# Patient Record
Sex: Male | Born: 1960
Health system: Southern US, Community
[De-identification: ages and names within clinical notes are randomized; demographics above are authoritative.]

## PROBLEM LIST (undated history)

## (undated) DIAGNOSIS — R7303 Prediabetes: Secondary | ICD-10-CM

## (undated) DIAGNOSIS — F72 Severe intellectual disabilities: Secondary | ICD-10-CM

## (undated) DIAGNOSIS — F840 Autistic disorder: Secondary | ICD-10-CM

## (undated) DIAGNOSIS — I10 Essential (primary) hypertension: Secondary | ICD-10-CM

## (undated) DIAGNOSIS — K219 Gastro-esophageal reflux disease without esophagitis: Secondary | ICD-10-CM

## (undated) DIAGNOSIS — F8189 Other developmental disorders of scholastic skills: Secondary | ICD-10-CM

## (undated) DIAGNOSIS — E782 Mixed hyperlipidemia: Secondary | ICD-10-CM

## (undated) DIAGNOSIS — G40909 Epilepsy, unspecified, not intractable, without status epilepticus: Secondary | ICD-10-CM

## (undated) HISTORY — DX: Mixed hyperlipidemia: E78.2

## (undated) HISTORY — DX: Essential (primary) hypertension: I10

## (undated) HISTORY — DX: Gastro-esophageal reflux disease without esophagitis: K21.9

## (undated) HISTORY — DX: Severe intellectual disabilities: F72

## (undated) HISTORY — DX: Prediabetes: R73.03

## (undated) HISTORY — DX: Other developmental disorders of scholastic skills: F81.89

## (undated) HISTORY — DX: Autistic disorder: F84.0

## (undated) HISTORY — DX: Epilepsy, unspecified, not intractable, without status epilepticus: G40.909

---

## 2000-09-22 ENCOUNTER — Emergency Department (HOSPITAL_COMMUNITY): Admission: EM | Admit: 2000-09-22 | Discharge: 2000-09-22 | Payer: Self-pay | Admitting: Emergency Medicine

## 2008-12-01 ENCOUNTER — Inpatient Hospital Stay (HOSPITAL_COMMUNITY): Admission: EM | Admit: 2008-12-01 | Discharge: 2008-12-06 | Payer: Self-pay | Admitting: Emergency Medicine

## 2008-12-01 ENCOUNTER — Ambulatory Visit (HOSPITAL_COMMUNITY): Admission: RE | Admit: 2008-12-01 | Discharge: 2008-12-01 | Payer: Self-pay | Admitting: Family Medicine

## 2009-06-19 ENCOUNTER — Ambulatory Visit (HOSPITAL_COMMUNITY): Admission: RE | Admit: 2009-06-19 | Discharge: 2009-06-19 | Payer: Self-pay | Admitting: Family Medicine

## 2010-11-05 LAB — DIFFERENTIAL
Basophils Relative: 0 % (ref 0–1)
Eosinophils Absolute: 0.1 10*3/uL (ref 0.0–0.7)
Lymphs Abs: 1.1 10*3/uL (ref 0.7–4.0)
Neutrophils Relative %: 69 % (ref 43–77)

## 2010-11-05 LAB — COMPREHENSIVE METABOLIC PANEL
ALT: 25 U/L (ref 0–53)
Albumin: 3.5 g/dL (ref 3.5–5.2)
Alkaline Phosphatase: 53 U/L (ref 39–117)
Potassium: 3.8 mEq/L (ref 3.5–5.1)
Sodium: 138 mEq/L (ref 135–145)
Total Protein: 7 g/dL (ref 6.0–8.3)

## 2010-11-05 LAB — CBC
HCT: 39.4 % (ref 39.0–52.0)
MCHC: 33.6 g/dL (ref 30.0–36.0)
MCHC: 34.3 g/dL (ref 30.0–36.0)
MCV: 89.6 fL (ref 78.0–100.0)
MCV: 90.3 fL (ref 78.0–100.0)
Platelets: 245 10*3/uL (ref 150–400)
RBC: 4.39 MIL/uL (ref 4.22–5.81)
WBC: 7.2 10*3/uL (ref 4.0–10.5)

## 2010-11-05 LAB — PROTIME-INR
INR: 1.1 (ref 0.00–1.49)
Prothrombin Time: 14.6 seconds (ref 11.6–15.2)

## 2010-11-05 LAB — BASIC METABOLIC PANEL
BUN: 8 mg/dL (ref 6–23)
BUN: 9 mg/dL (ref 6–23)
CO2: 22 mEq/L (ref 19–32)
Chloride: 105 mEq/L (ref 96–112)
Chloride: 108 mEq/L (ref 96–112)
Creatinine, Ser: 0.68 mg/dL (ref 0.4–1.5)
Creatinine, Ser: 0.79 mg/dL (ref 0.4–1.5)
GFR calc Af Amer: >60 mL/min (ref >60)
Potassium: 4.1 mEq/L (ref 3.5–5.1)

## 2010-11-05 LAB — ABO/RH: ABO/RH(D): A POS

## 2010-11-05 LAB — URINALYSIS, ROUTINE W REFLEX MICROSCOPIC
Glucose, UA: NEGATIVE mg/dL
Ketones, ur: NEGATIVE mg/dL
Nitrite: NEGATIVE
Protein, ur: NEGATIVE mg/dL
Urobilinogen, UA: 0.2 mg/dL (ref 0.0–1.0)

## 2010-11-05 LAB — TYPE AND SCREEN

## 2010-11-05 LAB — TSH: TSH: 1.823 u[IU]/mL (ref 0.350–4.500)

## 2010-11-05 LAB — GLUCOSE, CAPILLARY: Glucose-Capillary: 86 mg/dL (ref 70–99)

## 2010-12-10 NOTE — Discharge Summary (Signed)
[name], [name]             ACCOUNT NO.:  [id number]   MEDICAL RECORD NO.:  [id number]          PATIENT TYPE:  INP   LOCATION:  5013                         FACILITY:  [facility]   PHYSICIAN:  Michelene Gardener, MD    DATE OF BIRTH:  12-Oct-1960   DATE OF ADMISSION:  12/01/2008  DATE OF DISCHARGE:  12/06/2008                               DISCHARGE SUMMARY   DISCHARGE DIAGNOSES:  1. Right lower lobe pneumonia.  2. Right colonic pneumatosis.  3. Mental retardation.  4. Severe autism.  5. Gastroesophageal reflux disease.   DISCHARGE MEDICATIONS:  1. The patient will go home on the same preadmission medications that      include omeprazole 20 mg once a day.  2. Fexofenadine 180 mg once a day.  3. Oxybutynin 5 mg 3 times daily.  4. Seroquel 100 mg 3 times daily and 200 mg at bedtime.  5. Clonazepam 0.5 mg 3 times daily and 1 mg at bedtime.  6. Ensure one can twice daily.  7. Lactulose 30 mL twice daily as needed.  8. Tylenol 1000 mg as needed.  9. Carbamide peroxide eardrops 5-10 drops twice daily.   CONSULTATIONS:  Surgical consult by Sandria Bales. Ezzard Standing, M.D. initiated on  May 7.   PROCEDURES:  None.   DIAGNOSTIC STUDIES:  1. CT scan of the abdomen on May 7 showed intraperitoneal air with      pneumatosis of the right colon.  2. CT scan of pelvis with contrast on May 7 showed no significant      findings with tiny amount of free fluid.  3. Chest x-ray on May 8 showed opacity of the left lung base      consistent with air space disease.  4. Abdominal x-ray on May 8 showed upright abdominal x-ray series      showed presence of a small amount of free air under the      hemidiaphragm.   HOSPITAL COURSE:  This is a 50 year old mentally retarded patient who  was sent from a group home for evaluation of fever.  Initial evaluation  showed pneumatosis.  The patient was admitted to the hospital for  further evaluation.  His initial x-ray included x-ray that showed  possible pneumonia.   He also had CT scan of the abdomen and pelvis and  detailed results were mentioned above.  Because of the possible  pneumatosis in his peritoneum a surgical consult was obtained.  Initially he was kept n.p.o.  He was started on IV fluids.  He received  Protonix IV and he was given symptomatic treatment for pain and nausea.  Surgery saw him and decided to treat him conservatively unless his  condition deteriorated.  The patient responded very well to the medical  treatment.  His diet was started and was advanced.  By the time of  discharge the patient was back to his baseline and does not have any GI  symptoms.  He tolerated diet well.  He was deemed by surgery for  discharge.  He already received 6 days of IV  antibiotics and at this time there is no  need for further antibiotics  since he has been totally asymptomatic with no fever and normal white  counts and enough duration for IV antibiotics.  I will recommend  discharging him back on all preadmission medications.  Total assessment  time is 1 hour.      Michelene Gardener, MD  Electronically Signed     NAE/MEDQ  D:  12/06/2008  T:  12/06/2008  Job:  [id number]

## 2010-12-10 NOTE — Consult Note (Signed)
NAMEKAIEA, ESSELMAN NO.:  [id number]   MEDICAL RECORD NO.:  [id number]          PATIENT TYPE:  INP   LOCATION:  2101                         FACILITY:  [facility]   PHYSICIAN:  Sandria Bales. Ezzard Standing, M.D.  DATE OF BIRTH:  [birth year]   DATE OF CONSULTATION:  DATE OF DISCHARGE:                                 CONSULTATION   REASON FOR CONSULTATION:  Pneumoperitoneum.   REFERRING PHYSICIAN:  Samuel Jester, DO   HISTORY OF ILLNESS:  This is an unfortunate 50 year old white male who  sees Dr. Blane Ohara in [place].  He lives in a group home called [facility].  This is apparently a part of a large group of group homes  called [facility].  He has had severe life long mental retardation and  autism.  It sounds like people have to sit with him 24 hours a day.   He has at least 3 people from the home are in the emergency room with  him, Wyvonne Lenz, Corinne Ports and Philbert Riser.  I have no data from  the group home in [place] nor have I received any phone calls from Dr.  Sedalia Muta and the patient's history is obtained mainly by these 3 handlers of  Mr. Opdahl.   He apparently has repeat pneumonias that he requires treatment every 2  months.  He was actually hospitalized in [place] in December for some  period of time.   Over the last 7-10 days, he has had fevers of undetermined etiology.  He  has been on antibiotics apparently for 7-10 days, but I have no record  for those antibiotics.  It is somewhat unclear to me how he was in  [place] that he got sent to [facility].  The handler says that we  have an open CT scan and this is why he was sent here.  Apparently, he  had to be heavily sedated to get into the CAT scan today and on CAT  scan, he has been found to have small amounts of free air, sort of over  his liver, around his duodenum; it is kind of a very nonspecific pattern  and what looks like pneumatosis of his right colon and we were consulted  for evaluation  of this.   According to his handlers, he does have to take some omeprazole for  reflux, but has no known ulcer disease, liver disease, and no prior  abdominal surgery, or colon disease.  He does tend to have constipation  and they have him on lactulose to help with regular bowel movements.   PAST MEDICAL HISTORY:  He has no allergies.   CURRENT MEDICATIONS:  1. Omeprazole, dose unknown.  2. Allegra.  3. He takes Ditropan for incontinence.  4. He takes Seroquel for anxiety.  5. He takes clonazepam for anxiety.  6. He takes lactulose for help with his constipation.  7. He is on carbamide peroxide for ear irrigation.   REVIEW OF SYSTEMS:  NEUROLOGIC: he is severely retarded.  He sits in a  rocking position and this is according to his handlers and he kind of  moans and this is what he does 24 hours a day.  They say he will go days  without sleep and then he will sleep.  PULMONARY:  I have been mentioned before, he has repeat pneumonias, but  has not had any recent pneumonia that I can tell.  CARDIAC:  He has no heart issues, chest pain, or hypertension.  GASTROINTESTINAL:  See history of present illness.  UROLOGIC:  He has urinary incontinence and this is why he is on the  Ditropan.   He has a mother, Bedelia Person, who is the power of attorney, but Ms.  Loleta Chance is our of town and apparently he is still a full code.  This was  somewhat confusing to me as to why he does not have a limited or DNR  resuscitate code status.   PHYSICAL EXAMINATION:  VITAL SIGNS:  His temperature is 97, blood  pressure is 121/83, pulse is 110, and he is 98% sat.  GENERAL:  He is a pale, thin, white male.  He is in sitting position,  rocking back and forth with low moan.  NECK:  He moves his neck and is supple without pain.  LUNGS:  Symmetric breath sounds.  Again, he has a kind of loud moaning  sound he makes.  HEART:  Tachycardic, but I hear no clear murmur or rub.  ABDOMEN:  Actually is soft.  He is  sitting up, rocking.  He does not  have any peroneal signs.  He has bowels sounds, I can actually hear  them although it was somewhat decreased.  I can not get any guarding or  rebound in any of his quadrants.   LABORATORY DATA:  His white blood count is 7200, neutrophils 69%, his  hemoglobin is 14, hematocrit is 42.  His sodium is 138, potassium 3.8,  chloride of 105, CO2 of 27, BUN of 9, creatinine of 0.08.  Lactic acid  is 1.0.   IMPRESSION:  1. Right colon pneumatosis, etiology is somewhat unclear.  2. Pneumoperitoneum which really has a benign exam with normal white      blood count.  I do no think he has a surgical emergency.  I think      at least initially he is best treated with being kept n.p.o.,      placed on IV fluids for hydration, placed on IV antibiotics.      Following his CBC, and his physical exam is best can be done.  He      may even need a repeat CAT scan in 2-3 days.  I have asked the ER      to contact the hospitalist for admission and we will follow from a      surgical standpoint.  3. History of repeated pneumonias, though I do not see anything now at      least clinically that he has a pneumonia.  4. He has severe mental retardation.  5. He is autistic.  6. He is going to be a management problem in the hospital and that his      handlers are not anxious to stay up here and stay with him and they      will call sitters with him, he will need sitter 24 hours a day.      Sandria Bales. Ezzard Standing, M.D.  Electronically Signed     DHN/MEDQ  D:  12/01/2008  T:  12/02/2008  Job:  [id number]   cc:   Blane Ohara, M.D.  Manus Gunning, MD

## 2010-12-10 NOTE — H&P (Signed)
[name], [name] NO.:  [id number]   MEDICAL RECORD NO.:  [id number]          PATIENT TYPE:  INP   LOCATION:  1844                         FACILITY:  [facility]   PHYSICIAN:  Gerald Gunning, MD      DATE OF BIRTH:  1960-10-01   DATE OF ADMISSION:  12/01/2008  DATE OF DISCHARGE:                              HISTORY & PHYSICAL   CHIEF COMPLAINT:  Fevers at the patient's skilled nursing facility  measured at 102 degrees Fahrenheit.   HISTORY OF PRESENT ILLNESS:  [name] is a 50 year old Caucasian  male with severe autism and mental retardation.  All history has been  obtained from ED sign out, discussions with the surgical staff, as well  as discussion with the patient's aide who accompanied to him to the  emergency department.  Apparently [name] has been suffering with  fevers for the past week and was seen by his primary care physician, Dr.  Morton Webster and a chest x-ray was performed on Tuesday/Wednesday of this past  week.  Apparently the results were obtained earlier today at which time  she appreciated air under the right hemidiaphragm and instructed the  skilled nursing facility to bring him to the emergency department.  In  the emergency department, a CT scan of the abdomen has demonstrated the  patient to have a right colonic pneumatosis and surgery has evaluated  the patient, [name]. [name] and they have recommended the patient  to be kept on IV antibiotics, NPO, and on IV fluids and will closely  monitor him and decided whether he needs additional therapeutic  intervention, particularly exploratory laparotomy.  This decision is  based on the fact that the patient does not have an acute abdomen.  At  the time of presentation to the emergency department, he was afebrile  with 97 degrees Fahrenheit documented.  His H and H is stable at 14 and  42 respectively and his abdomen does not demonstrate any tenderness and  he has no leukocytosis  (WBC count 7200).   Apparently the patient has a longstanding history of constipation and  has had three bowel movements this past week according to the aide that  accompanies him to the hospital.  He also has frequent  pneumonia/bronchitis, approximately one to two episodes every two months  and he is incontinent of urine.  The patient's health care power of  attorney is [name] who is the patient's mother.  At this time,  the patient is hemodynamically stable.  He is rocking back and forth in  his bed which is aide claims is routine for the patient.  He does not  appear in any distress and examination though difficult has not yielded  any discomfort during palpation of the abdomen.  He does have positive  bowel sounds.   PAST MEDICAL/SURGICAL HISTORY:  1. Mental retardation.  2. Severely autistic.   SOCIAL HISTORY:  He lives at an assisted living facility.  No tobacco or  illicit history in the past.   FAMILY HISTORY:  Unavailable.  Aide does not know.   ALLERGIES:  The patient has no known drug allergies.   HOME MEDICATIONS:  1. Omeprazole.  2. Fexofenadine.  3. Oxybutynin chloride.  4. Seroquel.  5. Clonazepam.  6. Lactulose.  7. Carbamide peroxide.   Dose and frequency of these medications are unknown.   REVIEW OF SYSTEMS:  Review of systems has been attempted.  The pertinent  positives have been described above.  According to the aide, there is no  history of syncope, presycope, falls or trauma.  The patient has been  tolerating p.o. intake.  There is no history of odynophagia or  dysphagia.  No complaints of chest pain, palpitations, PND or orthopnea.  No shortness of breath, cough or expectoration.  There is no complaint  of abdominal pain and no diarrhea.  History of constipation.  No change  in his urinary habits.  He is essentially incontinent and there is no  history of musculoskeletal complaints.  Again, the patient himself is  not able to express  himself and all review of systems is essentially  subjective based on the aide's understanding of the patient's needs on a  daily basis.   PHYSICAL EXAMINATION:  VITALS AT TIME OF PRESENTATION:  Temperature 97,  heart rate 110, O2 sat 98%, respiratory rate 20, blood pressure 121/83.  GENERAL:  An elderly gentleman rocking back and forth in bed, in no  apparent distress.  HEENT:  Normocephalic, atraumatic.  Moist oral mucosa.  No thrush,  erythema or postnasal drip.  Eyes anicteric.  Extraocular muscles are  intact.  Pupils equal and react to light and accommodation.  CARDIOVASCULAR:  S1, S2 normal, regular rate and rhythm, no murmurs,  rubs or gallops.  RS:  Air entry is bilaterally equal.  No rales, rhonchi or wheezes  appreciated.  ABDOMEN:  Soft, nontender and does not appear to be distended.  Positive  bowel sounds.  No organomegaly.  There is no Collin's sign, no Turner's  sign, and no flank discoloration.  EXTREMITIES:  No cyanosis, clubbing or edema.  The patient is sitting up  but will not extend extremities completely.  CNS:  The patient has severe mental retardation and autistic.  He moves  all four extremities independently though.  There appeared to be no  focal neurological deficits on limited examination.  SKIN:  No break down, swelling, ulcerations or masses appreciated.  HEME/ONC:  No palpable lymphadenopathy, bruising or petechiae.   LABORATORY DATA:  Sodium 138, potassium 3.8, chloride 105, carbon  dioxide 27, glucose 100, BUN 9, creatinine 0.68, calcium 9.3, APTT 35,  INR 1.1, WBCs 7200, hemoglobin 14.1, hematocrit 42, platelet count 245,  polymorphs 69, lactic acid 1.0.   ASSESSMENT/PLAN:  1. Right colonic pneumatosis.  I have discussed the case with Dr.      Lindie Webster.  He has recommended observation at this time as there are no      signs of an acute abdomen.  We will admit him to the ICU and obtain      a one on one sitter with him.  He will be kept NPO, start D5  normal      saline at 125 cc an hour.  I will also Protonix 40 mg IV q.12 h.      and provide pain and anxiety control, morphine 2 mg IV q.4 h.      p.r.n. and Ativan 1 mg IV q.8 h. p.r.n.  For antibiotic control, we      will start Zosyn 3.375 grams IV q.6 h.  Will obtain an abdominal      KUB and chest x-ray in the morning.  If the patient's condition      does worsen overnight or he becomes unstable, emergent surgery may      be required.  Dr. Lindie Webster and Dr. Ezzard Standing are aware and will follow      the patient with [place].  2. GI and DVT prophylaxis.  Protonix 40 mg IV q.12 and sequential      compression devices.  3. History of mental retardation and severe autism.  Provide      supportive care at this time.  All p.o. meds have been held and      will be resumed once okayed by surgery.      Gerald Gunning, MD  Electronically Signed     SP/MEDQ  D:  12/01/2008  T:  12/01/2008  Job:  [id number]

## 2011-01-28 IMAGING — CT CT PELVIS W/ CM
2 of 5 series · 17 of 46 positions shown, 19 images · IV contrast (agent unspecified)
Comparison: 12/01/2008

CT ABDOMEN

CLINICAL DATA: Reevaluate free abdominal air.

CT ABDOMEN AND PELVIS WITH CONTRAST
TECHNIQUE: Multidetector CT imaging of the abdomen and pelvis was
performed using the standard protocol following bolus
administration of intravenous contrast.
Contrast: 100 ml of Lmnipaque-6EE

[Series 2: routine abdomen · axial · 0.70mm/px · z∈[-436,-16]mm · 14 of 94 slices shown, 16 images]
[im 5/94  soft-tissue]
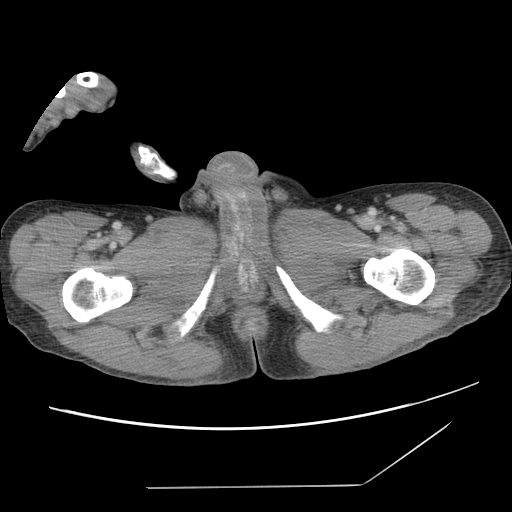
[im 5/94  bone]
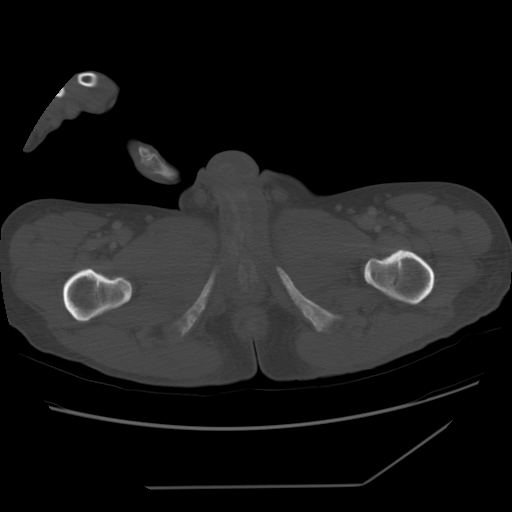
[im 10/94  soft-tissue]
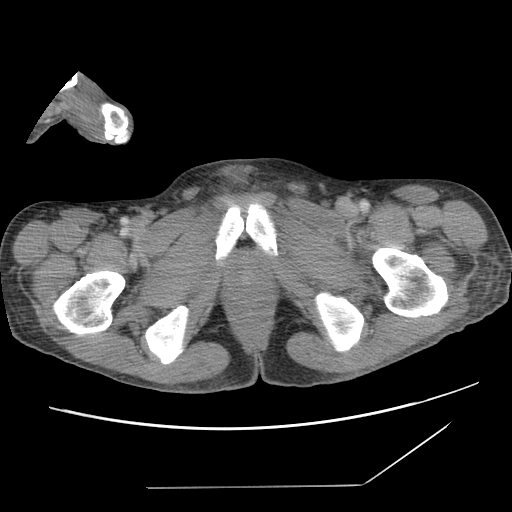
[im 20/94  soft-tissue]
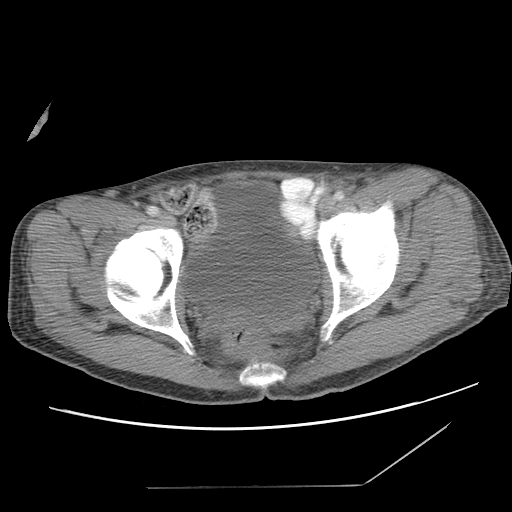
[im 25/94  soft-tissue]
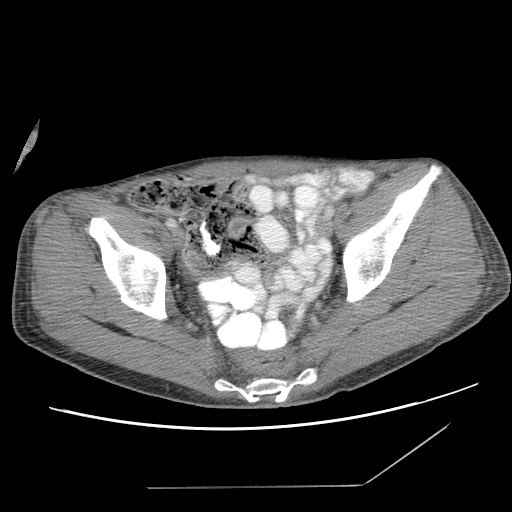
[im 30/94  soft-tissue]
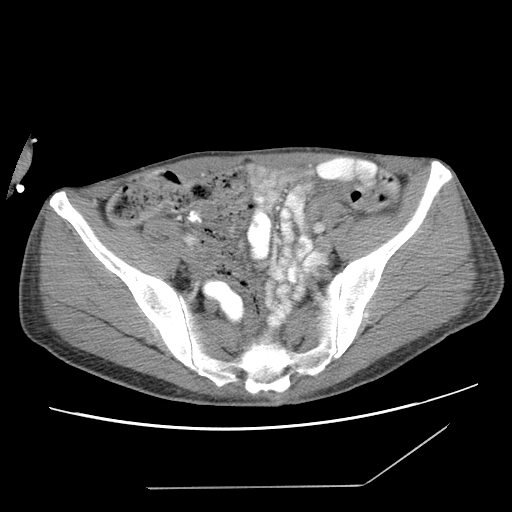
[im 40/94  soft-tissue]
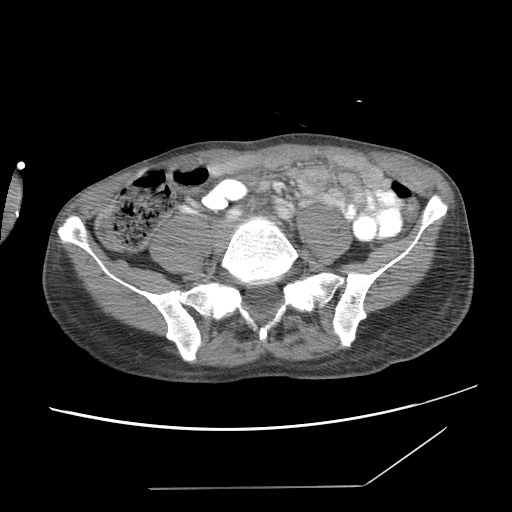
[im 45/94  soft-tissue]
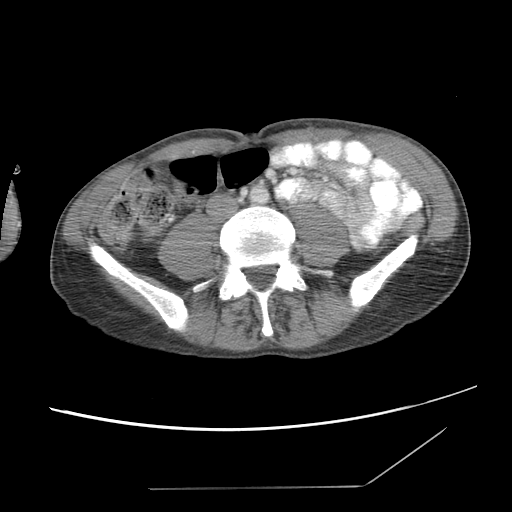
[im 49/94  soft-tissue]
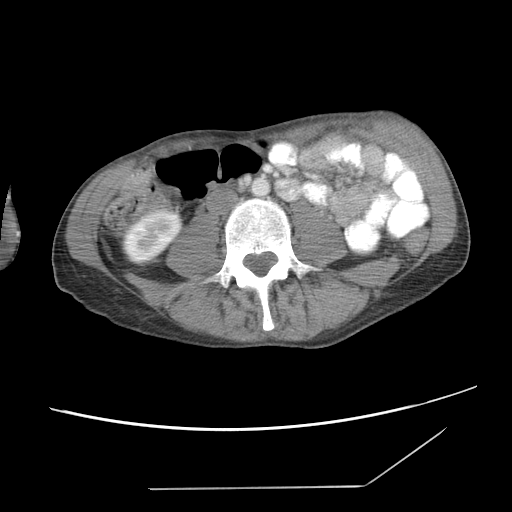
[im 54/94  soft-tissue]
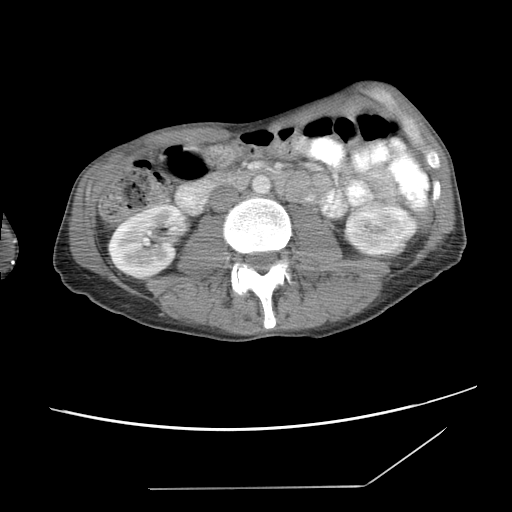
[im 54/94  bone]
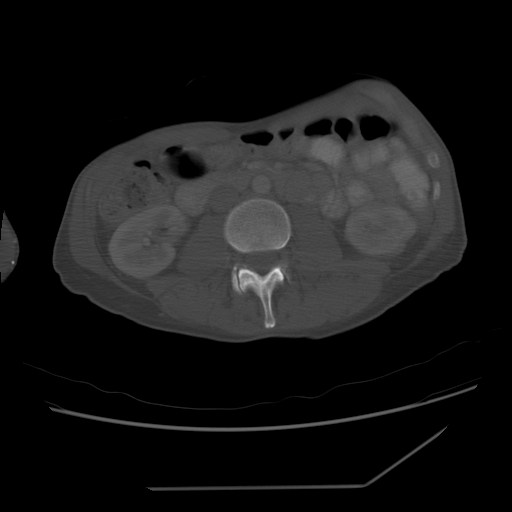
[im 64/94  soft-tissue]
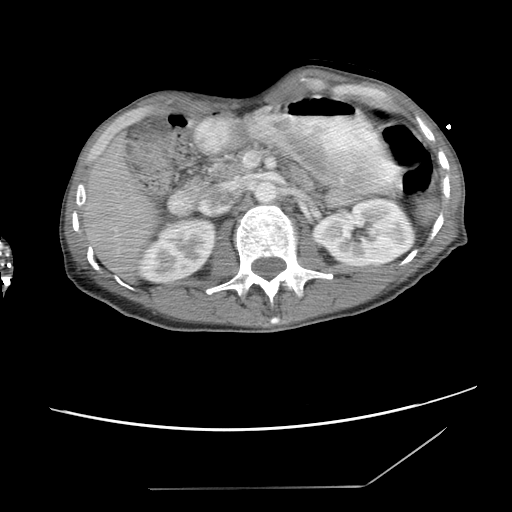
[im 69/94  soft-tissue]
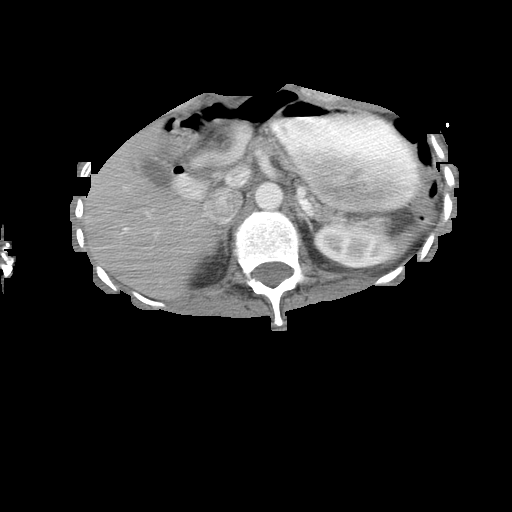
[im 74/94  soft-tissue]
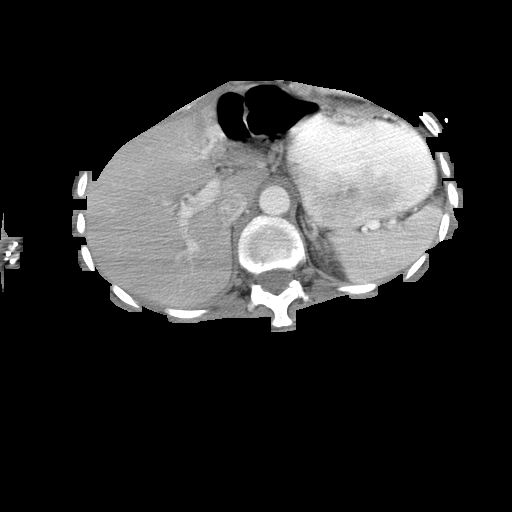
[im 84/94  soft-tissue]
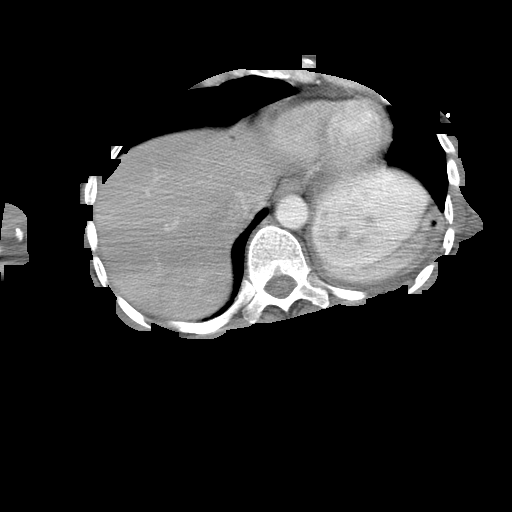
[im 89/94  soft-tissue]
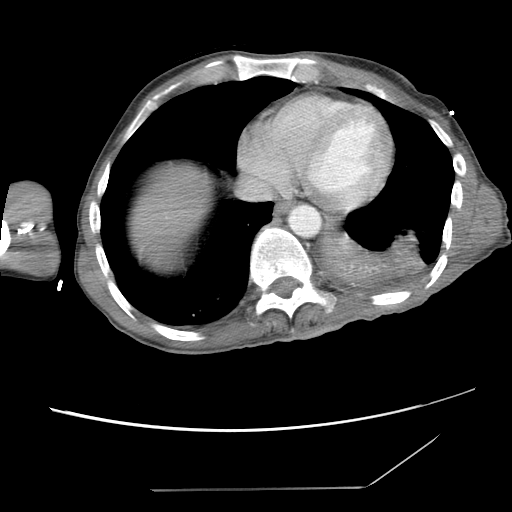

[Series 401: cor · coronal · 0.90mm/px · 3 of 75 slices shown]
[im 25/75  soft-tissue]
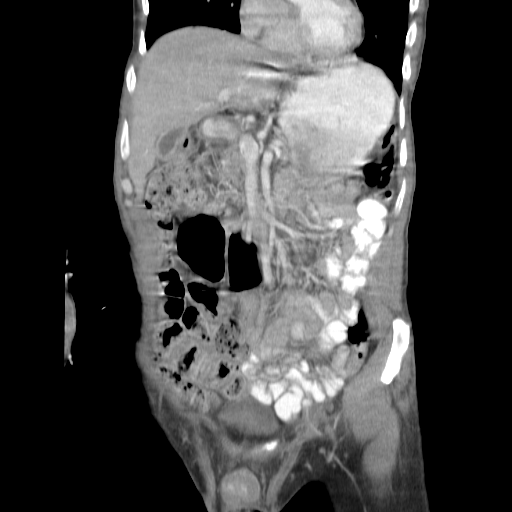
[im 33/75  soft-tissue]
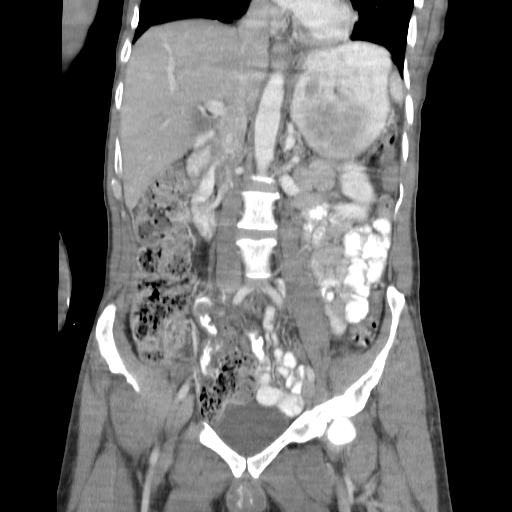
[im 42/75  soft-tissue]
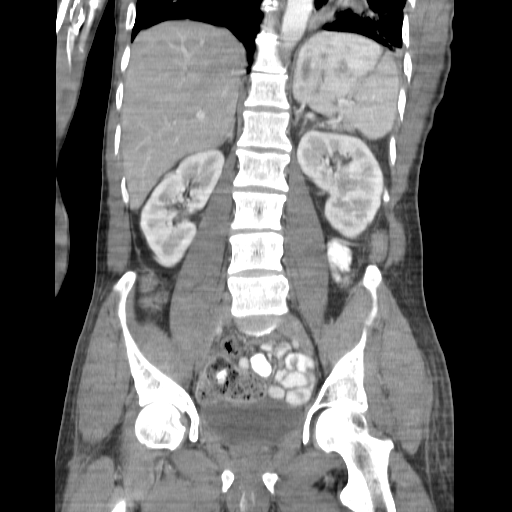

[17 of 46 positions shown; findings below may reference images not displayed]

FINDINGS: Persistent left lower lobe pneumonia and small
parapneumonic effusion.

Interval near complete resolution of the free intraperitoneal air
seen on the prior examination.  There are only the one or two dots
of residual air just anterior to the antral region of the stomach.
The liver and spleen are stable.  Pancreas is unremarkable.  The
gallbladder demonstrates mild wall thickening.  The stomach,
duodenum, small bowel and colon grossly normal.  I do not see any
pneumatosis involving the right colon was present previously.  Mild
thickening in the antropyloric region of the stomach may be due to
contraction.

The adrenal glands and kidneys are unremarkable and stable.  The
aorta is normal in caliber.  No mesenteric or retroperitoneal
masses or adenopathy.
IMPRESSION: 1.  Near complete resolution of the free air seen in the abdomen on
the previous CT scan.
2.  Resolution of the pneumatosis involving the right colon.
3.  Stable CT appearance of the solid abdominal organs.
4.  Mild gallbladder wall thickening.

CT PELVIS
FINDINGS: The rectum demonstrates mild diffuse wall thickening.
As a small amount of perirectal fluid.  The bladder appears normal.
The sigmoid colon and small bowel are grossly normal.  No pelvic
mass or adenopathy.  No inguinal adenopathy.  Mild prostate gland
enlargement.
IMPRESSION: 1.  Mild apparent rectal wall thickening.
2.  Small amount of free pelvic fluid.

## 2011-07-31 DIAGNOSIS — I10 Essential (primary) hypertension: Secondary | ICD-10-CM | POA: Diagnosis not present

## 2011-08-18 DIAGNOSIS — E7800 Pure hypercholesterolemia, unspecified: Secondary | ICD-10-CM | POA: Diagnosis not present

## 2011-08-18 DIAGNOSIS — F72 Severe intellectual disabilities: Secondary | ICD-10-CM | POA: Diagnosis not present

## 2011-08-18 DIAGNOSIS — I10 Essential (primary) hypertension: Secondary | ICD-10-CM | POA: Diagnosis not present

## 2011-08-18 DIAGNOSIS — Z79899 Other long term (current) drug therapy: Secondary | ICD-10-CM | POA: Diagnosis not present

## 2011-08-27 DIAGNOSIS — F71 Moderate intellectual disabilities: Secondary | ICD-10-CM | POA: Diagnosis not present

## 2011-08-27 DIAGNOSIS — H6120 Impacted cerumen, unspecified ear: Secondary | ICD-10-CM | POA: Diagnosis not present

## 2011-09-10 DIAGNOSIS — F6381 Intermittent explosive disorder: Secondary | ICD-10-CM | POA: Diagnosis not present

## 2011-11-18 DIAGNOSIS — H6120 Impacted cerumen, unspecified ear: Secondary | ICD-10-CM | POA: Diagnosis not present

## 2011-11-18 DIAGNOSIS — F72 Severe intellectual disabilities: Secondary | ICD-10-CM | POA: Diagnosis not present

## 2011-11-18 DIAGNOSIS — I10 Essential (primary) hypertension: Secondary | ICD-10-CM | POA: Diagnosis not present

## 2011-11-18 DIAGNOSIS — E7800 Pure hypercholesterolemia, unspecified: Secondary | ICD-10-CM | POA: Diagnosis not present

## 2011-11-18 DIAGNOSIS — J309 Allergic rhinitis, unspecified: Secondary | ICD-10-CM | POA: Diagnosis not present

## 2011-12-01 DIAGNOSIS — F71 Moderate intellectual disabilities: Secondary | ICD-10-CM | POA: Diagnosis not present

## 2011-12-01 DIAGNOSIS — H6120 Impacted cerumen, unspecified ear: Secondary | ICD-10-CM | POA: Diagnosis not present

## 2012-01-06 DIAGNOSIS — F6381 Intermittent explosive disorder: Secondary | ICD-10-CM | POA: Diagnosis not present

## 2012-01-24 DIAGNOSIS — L2089 Other atopic dermatitis: Secondary | ICD-10-CM | POA: Diagnosis not present

## 2012-01-27 DIAGNOSIS — R51 Headache: Secondary | ICD-10-CM | POA: Diagnosis not present

## 2012-01-27 DIAGNOSIS — F73 Profound intellectual disabilities: Secondary | ICD-10-CM | POA: Diagnosis not present

## 2012-01-27 DIAGNOSIS — F840 Autistic disorder: Secondary | ICD-10-CM | POA: Diagnosis not present

## 2012-02-12 DIAGNOSIS — F72 Severe intellectual disabilities: Secondary | ICD-10-CM | POA: Diagnosis not present

## 2012-02-12 DIAGNOSIS — Z0000 Encounter for general adult medical examination without abnormal findings: Secondary | ICD-10-CM | POA: Diagnosis not present

## 2012-02-12 DIAGNOSIS — H6120 Impacted cerumen, unspecified ear: Secondary | ICD-10-CM | POA: Diagnosis not present

## 2012-02-12 DIAGNOSIS — I10 Essential (primary) hypertension: Secondary | ICD-10-CM | POA: Diagnosis not present

## 2012-02-12 DIAGNOSIS — E7800 Pure hypercholesterolemia, unspecified: Secondary | ICD-10-CM | POA: Diagnosis not present

## 2012-02-16 DIAGNOSIS — Z111 Encounter for screening for respiratory tuberculosis: Secondary | ICD-10-CM | POA: Diagnosis not present

## 2012-04-12 DIAGNOSIS — R51 Headache: Secondary | ICD-10-CM | POA: Diagnosis not present

## 2012-04-12 DIAGNOSIS — F840 Autistic disorder: Secondary | ICD-10-CM | POA: Diagnosis not present

## 2012-04-12 DIAGNOSIS — F73 Profound intellectual disabilities: Secondary | ICD-10-CM | POA: Diagnosis not present

## 2012-04-27 DIAGNOSIS — F6381 Intermittent explosive disorder: Secondary | ICD-10-CM | POA: Diagnosis not present

## 2012-05-20 DIAGNOSIS — I10 Essential (primary) hypertension: Secondary | ICD-10-CM | POA: Diagnosis not present

## 2012-05-20 DIAGNOSIS — E7800 Pure hypercholesterolemia, unspecified: Secondary | ICD-10-CM | POA: Diagnosis not present

## 2012-05-20 DIAGNOSIS — F72 Severe intellectual disabilities: Secondary | ICD-10-CM | POA: Diagnosis not present

## 2012-05-20 DIAGNOSIS — J309 Allergic rhinitis, unspecified: Secondary | ICD-10-CM | POA: Diagnosis not present

## 2012-05-20 DIAGNOSIS — H6120 Impacted cerumen, unspecified ear: Secondary | ICD-10-CM | POA: Diagnosis not present

## 2012-05-20 DIAGNOSIS — Z23 Encounter for immunization: Secondary | ICD-10-CM | POA: Diagnosis not present

## 2012-07-19 DIAGNOSIS — J209 Acute bronchitis, unspecified: Secondary | ICD-10-CM | POA: Diagnosis not present

## 2012-08-17 DIAGNOSIS — R32 Unspecified urinary incontinence: Secondary | ICD-10-CM | POA: Diagnosis not present

## 2012-08-17 DIAGNOSIS — F840 Autistic disorder: Secondary | ICD-10-CM | POA: Diagnosis not present

## 2012-08-20 DIAGNOSIS — H6120 Impacted cerumen, unspecified ear: Secondary | ICD-10-CM | POA: Diagnosis not present

## 2012-08-20 DIAGNOSIS — Z79899 Other long term (current) drug therapy: Secondary | ICD-10-CM | POA: Diagnosis not present

## 2012-08-20 DIAGNOSIS — I10 Essential (primary) hypertension: Secondary | ICD-10-CM | POA: Diagnosis not present

## 2012-08-20 DIAGNOSIS — E7800 Pure hypercholesterolemia, unspecified: Secondary | ICD-10-CM | POA: Diagnosis not present

## 2012-08-20 DIAGNOSIS — F72 Severe intellectual disabilities: Secondary | ICD-10-CM | POA: Diagnosis not present

## 2012-09-15 DIAGNOSIS — R358 Other polyuria: Secondary | ICD-10-CM | POA: Diagnosis not present

## 2012-10-06 DIAGNOSIS — J069 Acute upper respiratory infection, unspecified: Secondary | ICD-10-CM | POA: Diagnosis not present

## 2012-10-12 DIAGNOSIS — F411 Generalized anxiety disorder: Secondary | ICD-10-CM | POA: Diagnosis not present

## 2012-10-19 DIAGNOSIS — F71 Moderate intellectual disabilities: Secondary | ICD-10-CM | POA: Diagnosis not present

## 2012-10-19 DIAGNOSIS — H6120 Impacted cerumen, unspecified ear: Secondary | ICD-10-CM | POA: Diagnosis not present

## 2012-10-20 DIAGNOSIS — N3000 Acute cystitis without hematuria: Secondary | ICD-10-CM | POA: Diagnosis not present

## 2012-11-18 DIAGNOSIS — F840 Autistic disorder: Secondary | ICD-10-CM | POA: Diagnosis not present

## 2012-11-18 DIAGNOSIS — R32 Unspecified urinary incontinence: Secondary | ICD-10-CM | POA: Diagnosis not present

## 2012-12-07 DIAGNOSIS — H66009 Acute suppurative otitis media without spontaneous rupture of ear drum, unspecified ear: Secondary | ICD-10-CM | POA: Diagnosis not present

## 2012-12-28 DIAGNOSIS — R634 Abnormal weight loss: Secondary | ICD-10-CM | POA: Diagnosis not present

## 2012-12-28 DIAGNOSIS — R7309 Other abnormal glucose: Secondary | ICD-10-CM | POA: Diagnosis not present

## 2013-01-03 DIAGNOSIS — F411 Generalized anxiety disorder: Secondary | ICD-10-CM | POA: Diagnosis not present

## 2013-01-14 DIAGNOSIS — R5383 Other fatigue: Secondary | ICD-10-CM | POA: Diagnosis not present

## 2013-01-15 DIAGNOSIS — R109 Unspecified abdominal pain: Secondary | ICD-10-CM | POA: Diagnosis not present

## 2013-01-18 DIAGNOSIS — Z0000 Encounter for general adult medical examination without abnormal findings: Secondary | ICD-10-CM | POA: Diagnosis not present

## 2013-01-18 DIAGNOSIS — K219 Gastro-esophageal reflux disease without esophagitis: Secondary | ICD-10-CM | POA: Diagnosis not present

## 2013-01-18 DIAGNOSIS — R10816 Epigastric abdominal tenderness: Secondary | ICD-10-CM | POA: Diagnosis not present

## 2013-01-18 DIAGNOSIS — Z111 Encounter for screening for respiratory tuberculosis: Secondary | ICD-10-CM | POA: Diagnosis not present

## 2013-01-25 DIAGNOSIS — B351 Tinea unguium: Secondary | ICD-10-CM | POA: Diagnosis not present

## 2013-01-31 DIAGNOSIS — F840 Autistic disorder: Secondary | ICD-10-CM | POA: Diagnosis not present

## 2013-01-31 DIAGNOSIS — R32 Unspecified urinary incontinence: Secondary | ICD-10-CM | POA: Diagnosis not present

## 2013-02-07 DIAGNOSIS — R32 Unspecified urinary incontinence: Secondary | ICD-10-CM | POA: Diagnosis not present

## 2013-02-07 DIAGNOSIS — B351 Tinea unguium: Secondary | ICD-10-CM | POA: Diagnosis not present

## 2013-02-07 DIAGNOSIS — K5901 Slow transit constipation: Secondary | ICD-10-CM | POA: Diagnosis not present

## 2013-02-07 DIAGNOSIS — F840 Autistic disorder: Secondary | ICD-10-CM | POA: Diagnosis not present

## 2013-03-18 DIAGNOSIS — R32 Unspecified urinary incontinence: Secondary | ICD-10-CM | POA: Diagnosis not present

## 2013-03-18 DIAGNOSIS — F840 Autistic disorder: Secondary | ICD-10-CM | POA: Diagnosis not present

## 2013-04-13 DIAGNOSIS — Z23 Encounter for immunization: Secondary | ICD-10-CM | POA: Diagnosis not present

## 2013-05-19 DIAGNOSIS — J069 Acute upper respiratory infection, unspecified: Secondary | ICD-10-CM | POA: Diagnosis not present

## 2013-06-06 DIAGNOSIS — F71 Moderate intellectual disabilities: Secondary | ICD-10-CM | POA: Diagnosis not present

## 2013-06-06 DIAGNOSIS — H6980 Other specified disorders of Eustachian tube, unspecified ear: Secondary | ICD-10-CM | POA: Diagnosis not present

## 2013-06-06 DIAGNOSIS — H6120 Impacted cerumen, unspecified ear: Secondary | ICD-10-CM | POA: Diagnosis not present

## 2013-07-04 DIAGNOSIS — F6381 Intermittent explosive disorder: Secondary | ICD-10-CM | POA: Diagnosis not present

## 2013-07-06 DIAGNOSIS — H40039 Anatomical narrow angle, unspecified eye: Secondary | ICD-10-CM | POA: Diagnosis not present

## 2013-07-15 DIAGNOSIS — J069 Acute upper respiratory infection, unspecified: Secondary | ICD-10-CM | POA: Diagnosis not present

## 2013-07-26 DIAGNOSIS — Z79899 Other long term (current) drug therapy: Secondary | ICD-10-CM | POA: Diagnosis not present

## 2013-09-15 DIAGNOSIS — B351 Tinea unguium: Secondary | ICD-10-CM | POA: Diagnosis not present

## 2013-10-03 DIAGNOSIS — F6381 Intermittent explosive disorder: Secondary | ICD-10-CM | POA: Diagnosis not present

## 2013-10-25 DIAGNOSIS — E782 Mixed hyperlipidemia: Secondary | ICD-10-CM | POA: Diagnosis not present

## 2013-10-25 DIAGNOSIS — R7309 Other abnormal glucose: Secondary | ICD-10-CM | POA: Diagnosis not present

## 2013-12-13 DIAGNOSIS — H6120 Impacted cerumen, unspecified ear: Secondary | ICD-10-CM | POA: Diagnosis not present

## 2013-12-13 DIAGNOSIS — F71 Moderate intellectual disabilities: Secondary | ICD-10-CM | POA: Diagnosis not present

## 2014-01-02 DIAGNOSIS — F6381 Intermittent explosive disorder: Secondary | ICD-10-CM | POA: Diagnosis not present

## 2014-01-12 DIAGNOSIS — K921 Melena: Secondary | ICD-10-CM | POA: Diagnosis not present

## 2014-01-16 DIAGNOSIS — K648 Other hemorrhoids: Secondary | ICD-10-CM | POA: Diagnosis not present

## 2014-01-16 DIAGNOSIS — K2990 Gastroduodenitis, unspecified, without bleeding: Secondary | ICD-10-CM | POA: Diagnosis not present

## 2014-01-16 DIAGNOSIS — K921 Melena: Secondary | ICD-10-CM | POA: Diagnosis not present

## 2014-01-16 DIAGNOSIS — Q438 Other specified congenital malformations of intestine: Secondary | ICD-10-CM | POA: Diagnosis not present

## 2014-01-16 DIAGNOSIS — R1310 Dysphagia, unspecified: Secondary | ICD-10-CM | POA: Diagnosis not present

## 2014-01-16 DIAGNOSIS — R634 Abnormal weight loss: Secondary | ICD-10-CM | POA: Diagnosis not present

## 2014-01-16 DIAGNOSIS — A048 Other specified bacterial intestinal infections: Secondary | ICD-10-CM | POA: Diagnosis not present

## 2014-01-16 DIAGNOSIS — K449 Diaphragmatic hernia without obstruction or gangrene: Secondary | ICD-10-CM | POA: Diagnosis not present

## 2014-01-16 DIAGNOSIS — K5900 Constipation, unspecified: Secondary | ICD-10-CM | POA: Diagnosis not present

## 2014-01-16 DIAGNOSIS — Z79899 Other long term (current) drug therapy: Secondary | ICD-10-CM | POA: Diagnosis not present

## 2014-01-16 DIAGNOSIS — R6250 Unspecified lack of expected normal physiological development in childhood: Secondary | ICD-10-CM | POA: Diagnosis not present

## 2014-01-16 DIAGNOSIS — D126 Benign neoplasm of colon, unspecified: Secondary | ICD-10-CM | POA: Diagnosis not present

## 2014-01-16 DIAGNOSIS — K2970 Gastritis, unspecified, without bleeding: Secondary | ICD-10-CM | POA: Diagnosis not present

## 2014-01-16 LAB — HM COLONOSCOPY

## 2014-01-31 DIAGNOSIS — Z125 Encounter for screening for malignant neoplasm of prostate: Secondary | ICD-10-CM | POA: Diagnosis not present

## 2014-01-31 DIAGNOSIS — I10 Essential (primary) hypertension: Secondary | ICD-10-CM | POA: Diagnosis not present

## 2014-01-31 DIAGNOSIS — Z0000 Encounter for general adult medical examination without abnormal findings: Secondary | ICD-10-CM | POA: Diagnosis not present

## 2014-01-31 DIAGNOSIS — Z79899 Other long term (current) drug therapy: Secondary | ICD-10-CM | POA: Diagnosis not present

## 2014-01-31 DIAGNOSIS — E782 Mixed hyperlipidemia: Secondary | ICD-10-CM | POA: Diagnosis not present

## 2014-01-31 DIAGNOSIS — Z111 Encounter for screening for respiratory tuberculosis: Secondary | ICD-10-CM | POA: Diagnosis not present

## 2014-01-31 DIAGNOSIS — R7309 Other abnormal glucose: Secondary | ICD-10-CM | POA: Diagnosis not present

## 2014-02-14 DIAGNOSIS — R21 Rash and other nonspecific skin eruption: Secondary | ICD-10-CM | POA: Diagnosis not present

## 2014-03-01 DIAGNOSIS — R634 Abnormal weight loss: Secondary | ICD-10-CM | POA: Diagnosis not present

## 2014-03-01 DIAGNOSIS — K5900 Constipation, unspecified: Secondary | ICD-10-CM | POA: Diagnosis not present

## 2014-03-13 DIAGNOSIS — F6381 Intermittent explosive disorder: Secondary | ICD-10-CM | POA: Diagnosis not present

## 2014-05-01 DIAGNOSIS — F6381 Intermittent explosive disorder: Secondary | ICD-10-CM | POA: Diagnosis not present

## 2014-05-03 DIAGNOSIS — Z23 Encounter for immunization: Secondary | ICD-10-CM | POA: Diagnosis not present

## 2014-05-31 DIAGNOSIS — J069 Acute upper respiratory infection, unspecified: Secondary | ICD-10-CM | POA: Diagnosis not present

## 2014-06-27 DIAGNOSIS — F71 Moderate intellectual disabilities: Secondary | ICD-10-CM | POA: Diagnosis not present

## 2014-06-27 DIAGNOSIS — H6123 Impacted cerumen, bilateral: Secondary | ICD-10-CM | POA: Diagnosis not present

## 2014-08-08 DIAGNOSIS — F6381 Intermittent explosive disorder: Secondary | ICD-10-CM | POA: Diagnosis not present

## 2014-09-06 DIAGNOSIS — J069 Acute upper respiratory infection, unspecified: Secondary | ICD-10-CM | POA: Diagnosis not present

## 2014-09-25 DIAGNOSIS — R21 Rash and other nonspecific skin eruption: Secondary | ICD-10-CM | POA: Diagnosis not present

## 2014-09-25 DIAGNOSIS — R350 Frequency of micturition: Secondary | ICD-10-CM | POA: Diagnosis not present

## 2014-09-26 DIAGNOSIS — R350 Frequency of micturition: Secondary | ICD-10-CM | POA: Diagnosis not present

## 2014-10-03 DIAGNOSIS — R32 Unspecified urinary incontinence: Secondary | ICD-10-CM | POA: Diagnosis not present

## 2014-11-06 DIAGNOSIS — F419 Anxiety disorder, unspecified: Secondary | ICD-10-CM | POA: Diagnosis not present

## 2014-11-29 DIAGNOSIS — J156 Pneumonia due to other aerobic Gram-negative bacteria: Secondary | ICD-10-CM | POA: Diagnosis present

## 2014-11-29 DIAGNOSIS — J9601 Acute respiratory failure with hypoxia: Secondary | ICD-10-CM | POA: Diagnosis not present

## 2014-11-29 DIAGNOSIS — F419 Anxiety disorder, unspecified: Secondary | ICD-10-CM | POA: Diagnosis present

## 2014-11-29 DIAGNOSIS — K5901 Slow transit constipation: Secondary | ICD-10-CM | POA: Diagnosis not present

## 2014-11-29 DIAGNOSIS — J159 Unspecified bacterial pneumonia: Secondary | ICD-10-CM | POA: Diagnosis not present

## 2014-11-29 DIAGNOSIS — R05 Cough: Secondary | ICD-10-CM | POA: Diagnosis not present

## 2014-11-29 DIAGNOSIS — R140 Abdominal distension (gaseous): Secondary | ICD-10-CM | POA: Diagnosis not present

## 2014-11-29 DIAGNOSIS — F840 Autistic disorder: Secondary | ICD-10-CM | POA: Diagnosis not present

## 2014-11-29 DIAGNOSIS — K5900 Constipation, unspecified: Secondary | ICD-10-CM | POA: Diagnosis not present

## 2014-11-29 DIAGNOSIS — R0902 Hypoxemia: Secondary | ICD-10-CM | POA: Diagnosis not present

## 2014-11-29 DIAGNOSIS — F819 Developmental disorder of scholastic skills, unspecified: Secondary | ICD-10-CM | POA: Diagnosis not present

## 2014-11-29 DIAGNOSIS — A419 Sepsis, unspecified organism: Secondary | ICD-10-CM | POA: Diagnosis not present

## 2014-11-29 DIAGNOSIS — Z79899 Other long term (current) drug therapy: Secondary | ICD-10-CM | POA: Diagnosis not present

## 2014-11-29 DIAGNOSIS — R1084 Generalized abdominal pain: Secondary | ICD-10-CM | POA: Diagnosis not present

## 2014-11-29 DIAGNOSIS — K760 Fatty (change of) liver, not elsewhere classified: Secondary | ICD-10-CM | POA: Diagnosis not present

## 2014-11-29 DIAGNOSIS — I10 Essential (primary) hypertension: Secondary | ICD-10-CM | POA: Diagnosis present

## 2014-11-29 DIAGNOSIS — K567 Ileus, unspecified: Secondary | ICD-10-CM | POA: Diagnosis present

## 2014-11-29 DIAGNOSIS — R109 Unspecified abdominal pain: Secondary | ICD-10-CM | POA: Diagnosis not present

## 2014-11-29 DIAGNOSIS — J189 Pneumonia, unspecified organism: Secondary | ICD-10-CM | POA: Diagnosis not present

## 2014-12-05 DIAGNOSIS — K567 Ileus, unspecified: Secondary | ICD-10-CM | POA: Diagnosis not present

## 2014-12-05 DIAGNOSIS — R1312 Dysphagia, oropharyngeal phase: Secondary | ICD-10-CM | POA: Diagnosis not present

## 2014-12-05 DIAGNOSIS — J156 Pneumonia due to other aerobic Gram-negative bacteria: Secondary | ICD-10-CM | POA: Diagnosis not present

## 2014-12-05 DIAGNOSIS — K5900 Constipation, unspecified: Secondary | ICD-10-CM | POA: Diagnosis not present

## 2014-12-12 DIAGNOSIS — R918 Other nonspecific abnormal finding of lung field: Secondary | ICD-10-CM | POA: Diagnosis not present

## 2014-12-12 DIAGNOSIS — J156 Pneumonia due to other aerobic Gram-negative bacteria: Secondary | ICD-10-CM | POA: Diagnosis not present

## 2014-12-12 DIAGNOSIS — J159 Unspecified bacterial pneumonia: Secondary | ICD-10-CM | POA: Diagnosis not present

## 2014-12-15 DIAGNOSIS — K5900 Constipation, unspecified: Secondary | ICD-10-CM | POA: Diagnosis not present

## 2014-12-15 DIAGNOSIS — R1312 Dysphagia, oropharyngeal phase: Secondary | ICD-10-CM | POA: Diagnosis not present

## 2014-12-15 DIAGNOSIS — K567 Ileus, unspecified: Secondary | ICD-10-CM | POA: Diagnosis not present

## 2014-12-15 DIAGNOSIS — J156 Pneumonia due to other aerobic Gram-negative bacteria: Secondary | ICD-10-CM | POA: Diagnosis not present

## 2014-12-19 DIAGNOSIS — K5900 Constipation, unspecified: Secondary | ICD-10-CM | POA: Diagnosis not present

## 2014-12-19 DIAGNOSIS — J156 Pneumonia due to other aerobic Gram-negative bacteria: Secondary | ICD-10-CM | POA: Diagnosis not present

## 2014-12-19 DIAGNOSIS — K567 Ileus, unspecified: Secondary | ICD-10-CM | POA: Diagnosis not present

## 2014-12-19 DIAGNOSIS — R1312 Dysphagia, oropharyngeal phase: Secondary | ICD-10-CM | POA: Diagnosis not present

## 2014-12-21 DIAGNOSIS — K5900 Constipation, unspecified: Secondary | ICD-10-CM | POA: Diagnosis not present

## 2014-12-21 DIAGNOSIS — J156 Pneumonia due to other aerobic Gram-negative bacteria: Secondary | ICD-10-CM | POA: Diagnosis not present

## 2014-12-21 DIAGNOSIS — R1312 Dysphagia, oropharyngeal phase: Secondary | ICD-10-CM | POA: Diagnosis not present

## 2014-12-21 DIAGNOSIS — K567 Ileus, unspecified: Secondary | ICD-10-CM | POA: Diagnosis not present

## 2014-12-27 DIAGNOSIS — K567 Ileus, unspecified: Secondary | ICD-10-CM | POA: Diagnosis not present

## 2014-12-27 DIAGNOSIS — J156 Pneumonia due to other aerobic Gram-negative bacteria: Secondary | ICD-10-CM | POA: Diagnosis not present

## 2014-12-27 DIAGNOSIS — R1312 Dysphagia, oropharyngeal phase: Secondary | ICD-10-CM | POA: Diagnosis not present

## 2014-12-27 DIAGNOSIS — K5900 Constipation, unspecified: Secondary | ICD-10-CM | POA: Diagnosis not present

## 2014-12-28 DIAGNOSIS — R0902 Hypoxemia: Secondary | ICD-10-CM | POA: Diagnosis not present

## 2014-12-28 DIAGNOSIS — J690 Pneumonitis due to inhalation of food and vomit: Secondary | ICD-10-CM | POA: Diagnosis not present

## 2014-12-28 DIAGNOSIS — F819 Developmental disorder of scholastic skills, unspecified: Secondary | ICD-10-CM | POA: Diagnosis not present

## 2014-12-28 DIAGNOSIS — R451 Restlessness and agitation: Secondary | ICD-10-CM | POA: Diagnosis present

## 2014-12-28 DIAGNOSIS — D72829 Elevated white blood cell count, unspecified: Secondary | ICD-10-CM | POA: Diagnosis not present

## 2014-12-28 DIAGNOSIS — F419 Anxiety disorder, unspecified: Secondary | ICD-10-CM | POA: Diagnosis present

## 2014-12-28 DIAGNOSIS — Z781 Physical restraint status: Secondary | ICD-10-CM | POA: Diagnosis not present

## 2014-12-28 DIAGNOSIS — K219 Gastro-esophageal reflux disease without esophagitis: Secondary | ICD-10-CM | POA: Diagnosis not present

## 2014-12-28 DIAGNOSIS — F72 Severe intellectual disabilities: Secondary | ICD-10-CM | POA: Diagnosis not present

## 2014-12-28 DIAGNOSIS — R0602 Shortness of breath: Secondary | ICD-10-CM | POA: Diagnosis not present

## 2014-12-28 DIAGNOSIS — R918 Other nonspecific abnormal finding of lung field: Secondary | ICD-10-CM | POA: Diagnosis not present

## 2014-12-28 DIAGNOSIS — R509 Fever, unspecified: Secondary | ICD-10-CM | POA: Diagnosis not present

## 2014-12-28 DIAGNOSIS — I10 Essential (primary) hypertension: Secondary | ICD-10-CM | POA: Diagnosis not present

## 2014-12-28 DIAGNOSIS — J156 Pneumonia due to other aerobic Gram-negative bacteria: Secondary | ICD-10-CM | POA: Diagnosis not present

## 2014-12-28 DIAGNOSIS — J189 Pneumonia, unspecified organism: Secondary | ICD-10-CM | POA: Diagnosis not present

## 2014-12-28 DIAGNOSIS — F840 Autistic disorder: Secondary | ICD-10-CM | POA: Diagnosis not present

## 2015-01-01 DIAGNOSIS — K567 Ileus, unspecified: Secondary | ICD-10-CM | POA: Diagnosis not present

## 2015-01-01 DIAGNOSIS — R1312 Dysphagia, oropharyngeal phase: Secondary | ICD-10-CM | POA: Diagnosis not present

## 2015-01-01 DIAGNOSIS — J156 Pneumonia due to other aerobic Gram-negative bacteria: Secondary | ICD-10-CM | POA: Diagnosis not present

## 2015-01-01 DIAGNOSIS — K5900 Constipation, unspecified: Secondary | ICD-10-CM | POA: Diagnosis not present

## 2015-01-02 DIAGNOSIS — J156 Pneumonia due to other aerobic Gram-negative bacteria: Secondary | ICD-10-CM | POA: Diagnosis not present

## 2015-01-02 DIAGNOSIS — J159 Unspecified bacterial pneumonia: Secondary | ICD-10-CM | POA: Diagnosis not present

## 2015-01-02 DIAGNOSIS — R1312 Dysphagia, oropharyngeal phase: Secondary | ICD-10-CM | POA: Diagnosis not present

## 2015-01-02 DIAGNOSIS — K567 Ileus, unspecified: Secondary | ICD-10-CM | POA: Diagnosis not present

## 2015-01-02 DIAGNOSIS — K5900 Constipation, unspecified: Secondary | ICD-10-CM | POA: Diagnosis not present

## 2015-01-09 DIAGNOSIS — J189 Pneumonia, unspecified organism: Secondary | ICD-10-CM | POA: Diagnosis not present

## 2015-01-09 DIAGNOSIS — K3189 Other diseases of stomach and duodenum: Secondary | ICD-10-CM | POA: Diagnosis not present

## 2015-01-10 DIAGNOSIS — R1312 Dysphagia, oropharyngeal phase: Secondary | ICD-10-CM | POA: Diagnosis not present

## 2015-01-10 DIAGNOSIS — J156 Pneumonia due to other aerobic Gram-negative bacteria: Secondary | ICD-10-CM | POA: Diagnosis not present

## 2015-01-10 DIAGNOSIS — K567 Ileus, unspecified: Secondary | ICD-10-CM | POA: Diagnosis not present

## 2015-01-10 DIAGNOSIS — K5900 Constipation, unspecified: Secondary | ICD-10-CM | POA: Diagnosis not present

## 2015-01-11 DIAGNOSIS — J156 Pneumonia due to other aerobic Gram-negative bacteria: Secondary | ICD-10-CM | POA: Diagnosis not present

## 2015-01-11 DIAGNOSIS — K5900 Constipation, unspecified: Secondary | ICD-10-CM | POA: Diagnosis not present

## 2015-01-11 DIAGNOSIS — R1312 Dysphagia, oropharyngeal phase: Secondary | ICD-10-CM | POA: Diagnosis not present

## 2015-01-11 DIAGNOSIS — K567 Ileus, unspecified: Secondary | ICD-10-CM | POA: Diagnosis not present

## 2015-01-15 DIAGNOSIS — L03116 Cellulitis of left lower limb: Secondary | ICD-10-CM | POA: Diagnosis not present

## 2015-01-16 DIAGNOSIS — J156 Pneumonia due to other aerobic Gram-negative bacteria: Secondary | ICD-10-CM | POA: Diagnosis not present

## 2015-01-16 DIAGNOSIS — R1312 Dysphagia, oropharyngeal phase: Secondary | ICD-10-CM | POA: Diagnosis not present

## 2015-01-16 DIAGNOSIS — K567 Ileus, unspecified: Secondary | ICD-10-CM | POA: Diagnosis not present

## 2015-01-16 DIAGNOSIS — K5900 Constipation, unspecified: Secondary | ICD-10-CM | POA: Diagnosis not present

## 2015-01-20 DIAGNOSIS — J156 Pneumonia due to other aerobic Gram-negative bacteria: Secondary | ICD-10-CM | POA: Diagnosis not present

## 2015-01-20 DIAGNOSIS — R1312 Dysphagia, oropharyngeal phase: Secondary | ICD-10-CM | POA: Diagnosis not present

## 2015-01-20 DIAGNOSIS — K567 Ileus, unspecified: Secondary | ICD-10-CM | POA: Diagnosis not present

## 2015-01-20 DIAGNOSIS — K5900 Constipation, unspecified: Secondary | ICD-10-CM | POA: Diagnosis not present

## 2015-01-23 DIAGNOSIS — J069 Acute upper respiratory infection, unspecified: Secondary | ICD-10-CM | POA: Diagnosis not present

## 2015-01-25 DIAGNOSIS — R1312 Dysphagia, oropharyngeal phase: Secondary | ICD-10-CM | POA: Diagnosis not present

## 2015-01-25 DIAGNOSIS — K5900 Constipation, unspecified: Secondary | ICD-10-CM | POA: Diagnosis not present

## 2015-01-25 DIAGNOSIS — J156 Pneumonia due to other aerobic Gram-negative bacteria: Secondary | ICD-10-CM | POA: Diagnosis not present

## 2015-01-25 DIAGNOSIS — K567 Ileus, unspecified: Secondary | ICD-10-CM | POA: Diagnosis not present

## 2015-01-26 DIAGNOSIS — J156 Pneumonia due to other aerobic Gram-negative bacteria: Secondary | ICD-10-CM | POA: Diagnosis not present

## 2015-01-26 DIAGNOSIS — K567 Ileus, unspecified: Secondary | ICD-10-CM | POA: Diagnosis not present

## 2015-01-26 DIAGNOSIS — K5900 Constipation, unspecified: Secondary | ICD-10-CM | POA: Diagnosis not present

## 2015-01-26 DIAGNOSIS — R1312 Dysphagia, oropharyngeal phase: Secondary | ICD-10-CM | POA: Diagnosis not present

## 2015-01-31 DIAGNOSIS — R1312 Dysphagia, oropharyngeal phase: Secondary | ICD-10-CM | POA: Diagnosis not present

## 2015-01-31 DIAGNOSIS — K5900 Constipation, unspecified: Secondary | ICD-10-CM | POA: Diagnosis not present

## 2015-01-31 DIAGNOSIS — J159 Unspecified bacterial pneumonia: Secondary | ICD-10-CM | POA: Diagnosis not present

## 2015-01-31 DIAGNOSIS — R918 Other nonspecific abnormal finding of lung field: Secondary | ICD-10-CM | POA: Diagnosis not present

## 2015-01-31 DIAGNOSIS — K567 Ileus, unspecified: Secondary | ICD-10-CM | POA: Diagnosis not present

## 2015-01-31 DIAGNOSIS — J156 Pneumonia due to other aerobic Gram-negative bacteria: Secondary | ICD-10-CM | POA: Diagnosis not present

## 2015-02-02 DIAGNOSIS — R1312 Dysphagia, oropharyngeal phase: Secondary | ICD-10-CM | POA: Diagnosis not present

## 2015-02-02 DIAGNOSIS — K567 Ileus, unspecified: Secondary | ICD-10-CM | POA: Diagnosis not present

## 2015-02-02 DIAGNOSIS — J156 Pneumonia due to other aerobic Gram-negative bacteria: Secondary | ICD-10-CM | POA: Diagnosis not present

## 2015-02-02 DIAGNOSIS — K5900 Constipation, unspecified: Secondary | ICD-10-CM | POA: Diagnosis not present

## 2015-02-03 DIAGNOSIS — R1312 Dysphagia, oropharyngeal phase: Secondary | ICD-10-CM | POA: Diagnosis not present

## 2015-02-03 DIAGNOSIS — I10 Essential (primary) hypertension: Secondary | ICD-10-CM | POA: Diagnosis not present

## 2015-02-03 DIAGNOSIS — Z8701 Personal history of pneumonia (recurrent): Secondary | ICD-10-CM | POA: Diagnosis not present

## 2015-02-03 DIAGNOSIS — F840 Autistic disorder: Secondary | ICD-10-CM | POA: Diagnosis not present

## 2015-02-05 DIAGNOSIS — I10 Essential (primary) hypertension: Secondary | ICD-10-CM | POA: Diagnosis not present

## 2015-02-05 DIAGNOSIS — F840 Autistic disorder: Secondary | ICD-10-CM | POA: Diagnosis not present

## 2015-02-05 DIAGNOSIS — Z8701 Personal history of pneumonia (recurrent): Secondary | ICD-10-CM | POA: Diagnosis not present

## 2015-02-05 DIAGNOSIS — R1312 Dysphagia, oropharyngeal phase: Secondary | ICD-10-CM | POA: Diagnosis not present

## 2015-02-07 DIAGNOSIS — I10 Essential (primary) hypertension: Secondary | ICD-10-CM | POA: Diagnosis not present

## 2015-02-07 DIAGNOSIS — Z8701 Personal history of pneumonia (recurrent): Secondary | ICD-10-CM | POA: Diagnosis not present

## 2015-02-07 DIAGNOSIS — R1312 Dysphagia, oropharyngeal phase: Secondary | ICD-10-CM | POA: Diagnosis not present

## 2015-02-07 DIAGNOSIS — F840 Autistic disorder: Secondary | ICD-10-CM | POA: Diagnosis not present

## 2015-02-08 DIAGNOSIS — E782 Mixed hyperlipidemia: Secondary | ICD-10-CM | POA: Diagnosis not present

## 2015-02-08 DIAGNOSIS — Z8701 Personal history of pneumonia (recurrent): Secondary | ICD-10-CM | POA: Diagnosis not present

## 2015-02-08 DIAGNOSIS — F840 Autistic disorder: Secondary | ICD-10-CM | POA: Diagnosis not present

## 2015-02-08 DIAGNOSIS — I10 Essential (primary) hypertension: Secondary | ICD-10-CM | POA: Diagnosis not present

## 2015-02-08 DIAGNOSIS — R7301 Impaired fasting glucose: Secondary | ICD-10-CM | POA: Diagnosis not present

## 2015-02-08 DIAGNOSIS — R1312 Dysphagia, oropharyngeal phase: Secondary | ICD-10-CM | POA: Diagnosis not present

## 2015-02-08 DIAGNOSIS — Z0000 Encounter for general adult medical examination without abnormal findings: Secondary | ICD-10-CM | POA: Diagnosis not present

## 2015-02-08 DIAGNOSIS — Z125 Encounter for screening for malignant neoplasm of prostate: Secondary | ICD-10-CM | POA: Diagnosis not present

## 2015-02-12 DIAGNOSIS — K222 Esophageal obstruction: Secondary | ICD-10-CM | POA: Diagnosis not present

## 2015-02-12 DIAGNOSIS — K219 Gastro-esophageal reflux disease without esophagitis: Secondary | ICD-10-CM | POA: Diagnosis not present

## 2015-02-12 DIAGNOSIS — K5900 Constipation, unspecified: Secondary | ICD-10-CM | POA: Diagnosis not present

## 2015-02-14 DIAGNOSIS — Z111 Encounter for screening for respiratory tuberculosis: Secondary | ICD-10-CM | POA: Diagnosis not present

## 2015-02-15 DIAGNOSIS — R630 Anorexia: Secondary | ICD-10-CM | POA: Diagnosis not present

## 2015-02-15 DIAGNOSIS — K449 Diaphragmatic hernia without obstruction or gangrene: Secondary | ICD-10-CM | POA: Diagnosis not present

## 2015-02-15 DIAGNOSIS — F840 Autistic disorder: Secondary | ICD-10-CM | POA: Diagnosis not present

## 2015-02-15 DIAGNOSIS — R1310 Dysphagia, unspecified: Secondary | ICD-10-CM | POA: Diagnosis not present

## 2015-02-15 DIAGNOSIS — Z8711 Personal history of peptic ulcer disease: Secondary | ICD-10-CM | POA: Diagnosis not present

## 2015-02-15 DIAGNOSIS — R1312 Dysphagia, oropharyngeal phase: Secondary | ICD-10-CM | POA: Diagnosis not present

## 2015-02-15 DIAGNOSIS — Z8701 Personal history of pneumonia (recurrent): Secondary | ICD-10-CM | POA: Diagnosis not present

## 2015-02-15 DIAGNOSIS — K2970 Gastritis, unspecified, without bleeding: Secondary | ICD-10-CM | POA: Diagnosis not present

## 2015-02-15 DIAGNOSIS — I10 Essential (primary) hypertension: Secondary | ICD-10-CM | POA: Diagnosis not present

## 2015-02-16 DIAGNOSIS — F840 Autistic disorder: Secondary | ICD-10-CM | POA: Diagnosis not present

## 2015-02-16 DIAGNOSIS — R1312 Dysphagia, oropharyngeal phase: Secondary | ICD-10-CM | POA: Diagnosis not present

## 2015-02-16 DIAGNOSIS — K2950 Unspecified chronic gastritis without bleeding: Secondary | ICD-10-CM | POA: Diagnosis not present

## 2015-02-16 DIAGNOSIS — Z8701 Personal history of pneumonia (recurrent): Secondary | ICD-10-CM | POA: Diagnosis not present

## 2015-02-16 DIAGNOSIS — I10 Essential (primary) hypertension: Secondary | ICD-10-CM | POA: Diagnosis not present

## 2015-02-19 DIAGNOSIS — I10 Essential (primary) hypertension: Secondary | ICD-10-CM | POA: Diagnosis not present

## 2015-02-19 DIAGNOSIS — R1312 Dysphagia, oropharyngeal phase: Secondary | ICD-10-CM | POA: Diagnosis not present

## 2015-02-19 DIAGNOSIS — Z8701 Personal history of pneumonia (recurrent): Secondary | ICD-10-CM | POA: Diagnosis not present

## 2015-02-19 DIAGNOSIS — F840 Autistic disorder: Secondary | ICD-10-CM | POA: Diagnosis not present

## 2015-02-20 DIAGNOSIS — F6381 Intermittent explosive disorder: Secondary | ICD-10-CM | POA: Diagnosis not present

## 2015-02-23 DIAGNOSIS — I10 Essential (primary) hypertension: Secondary | ICD-10-CM | POA: Diagnosis not present

## 2015-02-23 DIAGNOSIS — Z8701 Personal history of pneumonia (recurrent): Secondary | ICD-10-CM | POA: Diagnosis not present

## 2015-02-23 DIAGNOSIS — F840 Autistic disorder: Secondary | ICD-10-CM | POA: Diagnosis not present

## 2015-02-23 DIAGNOSIS — R1312 Dysphagia, oropharyngeal phase: Secondary | ICD-10-CM | POA: Diagnosis not present

## 2015-02-26 DIAGNOSIS — I10 Essential (primary) hypertension: Secondary | ICD-10-CM | POA: Diagnosis not present

## 2015-02-26 DIAGNOSIS — F840 Autistic disorder: Secondary | ICD-10-CM | POA: Diagnosis not present

## 2015-02-26 DIAGNOSIS — Z8701 Personal history of pneumonia (recurrent): Secondary | ICD-10-CM | POA: Diagnosis not present

## 2015-02-26 DIAGNOSIS — R1312 Dysphagia, oropharyngeal phase: Secondary | ICD-10-CM | POA: Diagnosis not present

## 2015-02-27 DIAGNOSIS — F840 Autistic disorder: Secondary | ICD-10-CM | POA: Diagnosis not present

## 2015-02-27 DIAGNOSIS — Z8701 Personal history of pneumonia (recurrent): Secondary | ICD-10-CM | POA: Diagnosis not present

## 2015-02-27 DIAGNOSIS — I10 Essential (primary) hypertension: Secondary | ICD-10-CM | POA: Diagnosis not present

## 2015-02-27 DIAGNOSIS — R1312 Dysphagia, oropharyngeal phase: Secondary | ICD-10-CM | POA: Diagnosis not present

## 2015-03-05 DIAGNOSIS — I10 Essential (primary) hypertension: Secondary | ICD-10-CM | POA: Diagnosis not present

## 2015-03-05 DIAGNOSIS — F840 Autistic disorder: Secondary | ICD-10-CM | POA: Diagnosis not present

## 2015-03-05 DIAGNOSIS — Z8701 Personal history of pneumonia (recurrent): Secondary | ICD-10-CM | POA: Diagnosis not present

## 2015-03-05 DIAGNOSIS — R1312 Dysphagia, oropharyngeal phase: Secondary | ICD-10-CM | POA: Diagnosis not present

## 2015-03-09 DIAGNOSIS — R1312 Dysphagia, oropharyngeal phase: Secondary | ICD-10-CM | POA: Diagnosis not present

## 2015-03-09 DIAGNOSIS — I10 Essential (primary) hypertension: Secondary | ICD-10-CM | POA: Diagnosis not present

## 2015-03-09 DIAGNOSIS — Z8701 Personal history of pneumonia (recurrent): Secondary | ICD-10-CM | POA: Diagnosis not present

## 2015-03-09 DIAGNOSIS — F840 Autistic disorder: Secondary | ICD-10-CM | POA: Diagnosis not present

## 2015-03-12 DIAGNOSIS — F840 Autistic disorder: Secondary | ICD-10-CM | POA: Diagnosis not present

## 2015-03-12 DIAGNOSIS — R1312 Dysphagia, oropharyngeal phase: Secondary | ICD-10-CM | POA: Diagnosis not present

## 2015-03-12 DIAGNOSIS — I10 Essential (primary) hypertension: Secondary | ICD-10-CM | POA: Diagnosis not present

## 2015-03-12 DIAGNOSIS — Z8701 Personal history of pneumonia (recurrent): Secondary | ICD-10-CM | POA: Diagnosis not present

## 2015-03-14 DIAGNOSIS — I10 Essential (primary) hypertension: Secondary | ICD-10-CM | POA: Diagnosis not present

## 2015-03-14 DIAGNOSIS — R1312 Dysphagia, oropharyngeal phase: Secondary | ICD-10-CM | POA: Diagnosis not present

## 2015-03-14 DIAGNOSIS — Z8701 Personal history of pneumonia (recurrent): Secondary | ICD-10-CM | POA: Diagnosis not present

## 2015-03-14 DIAGNOSIS — F840 Autistic disorder: Secondary | ICD-10-CM | POA: Diagnosis not present

## 2015-03-21 DIAGNOSIS — F840 Autistic disorder: Secondary | ICD-10-CM | POA: Diagnosis not present

## 2015-03-21 DIAGNOSIS — R1312 Dysphagia, oropharyngeal phase: Secondary | ICD-10-CM | POA: Diagnosis not present

## 2015-03-21 DIAGNOSIS — Z8701 Personal history of pneumonia (recurrent): Secondary | ICD-10-CM | POA: Diagnosis not present

## 2015-03-21 DIAGNOSIS — I10 Essential (primary) hypertension: Secondary | ICD-10-CM | POA: Diagnosis not present

## 2015-03-23 DIAGNOSIS — Z8701 Personal history of pneumonia (recurrent): Secondary | ICD-10-CM | POA: Diagnosis not present

## 2015-03-23 DIAGNOSIS — F840 Autistic disorder: Secondary | ICD-10-CM | POA: Diagnosis not present

## 2015-03-23 DIAGNOSIS — I10 Essential (primary) hypertension: Secondary | ICD-10-CM | POA: Diagnosis not present

## 2015-03-23 DIAGNOSIS — R1312 Dysphagia, oropharyngeal phase: Secondary | ICD-10-CM | POA: Diagnosis not present

## 2015-03-26 DIAGNOSIS — R1312 Dysphagia, oropharyngeal phase: Secondary | ICD-10-CM | POA: Diagnosis not present

## 2015-03-26 DIAGNOSIS — Z8701 Personal history of pneumonia (recurrent): Secondary | ICD-10-CM | POA: Diagnosis not present

## 2015-03-26 DIAGNOSIS — F840 Autistic disorder: Secondary | ICD-10-CM | POA: Diagnosis not present

## 2015-03-26 DIAGNOSIS — I10 Essential (primary) hypertension: Secondary | ICD-10-CM | POA: Diagnosis not present

## 2015-03-27 DIAGNOSIS — R1312 Dysphagia, oropharyngeal phase: Secondary | ICD-10-CM | POA: Diagnosis not present

## 2015-03-27 DIAGNOSIS — I10 Essential (primary) hypertension: Secondary | ICD-10-CM | POA: Diagnosis not present

## 2015-03-27 DIAGNOSIS — Z8701 Personal history of pneumonia (recurrent): Secondary | ICD-10-CM | POA: Diagnosis not present

## 2015-03-27 DIAGNOSIS — F840 Autistic disorder: Secondary | ICD-10-CM | POA: Diagnosis not present

## 2015-03-28 DIAGNOSIS — R634 Abnormal weight loss: Secondary | ICD-10-CM | POA: Diagnosis not present

## 2015-03-28 DIAGNOSIS — K5900 Constipation, unspecified: Secondary | ICD-10-CM | POA: Diagnosis not present

## 2015-03-28 DIAGNOSIS — Z23 Encounter for immunization: Secondary | ICD-10-CM | POA: Diagnosis not present

## 2015-03-28 DIAGNOSIS — I10 Essential (primary) hypertension: Secondary | ICD-10-CM | POA: Diagnosis not present

## 2015-04-10 DIAGNOSIS — R5383 Other fatigue: Secondary | ICD-10-CM | POA: Diagnosis not present

## 2015-04-10 DIAGNOSIS — R5381 Other malaise: Secondary | ICD-10-CM | POA: Diagnosis not present

## 2015-04-14 DIAGNOSIS — J189 Pneumonia, unspecified organism: Secondary | ICD-10-CM | POA: Diagnosis not present

## 2015-05-11 DIAGNOSIS — R799 Abnormal finding of blood chemistry, unspecified: Secondary | ICD-10-CM | POA: Diagnosis not present

## 2015-06-05 DIAGNOSIS — D72829 Elevated white blood cell count, unspecified: Secondary | ICD-10-CM

## 2015-06-05 DIAGNOSIS — D7289 Other specified disorders of white blood cells: Secondary | ICD-10-CM | POA: Diagnosis not present

## 2015-06-05 DIAGNOSIS — Z8701 Personal history of pneumonia (recurrent): Secondary | ICD-10-CM

## 2015-06-26 DIAGNOSIS — F6381 Intermittent explosive disorder: Secondary | ICD-10-CM | POA: Diagnosis not present

## 2015-06-28 DIAGNOSIS — R7301 Impaired fasting glucose: Secondary | ICD-10-CM | POA: Diagnosis not present

## 2015-06-28 DIAGNOSIS — F72 Severe intellectual disabilities: Secondary | ICD-10-CM | POA: Diagnosis not present

## 2015-06-28 DIAGNOSIS — E782 Mixed hyperlipidemia: Secondary | ICD-10-CM | POA: Diagnosis not present

## 2015-06-28 DIAGNOSIS — K219 Gastro-esophageal reflux disease without esophagitis: Secondary | ICD-10-CM | POA: Diagnosis not present

## 2015-06-28 DIAGNOSIS — I10 Essential (primary) hypertension: Secondary | ICD-10-CM | POA: Diagnosis not present

## 2015-07-26 DIAGNOSIS — J060 Acute laryngopharyngitis: Secondary | ICD-10-CM | POA: Diagnosis not present

## 2015-08-03 DIAGNOSIS — J208 Acute bronchitis due to other specified organisms: Secondary | ICD-10-CM | POA: Diagnosis not present

## 2015-09-24 DIAGNOSIS — F6381 Intermittent explosive disorder: Secondary | ICD-10-CM | POA: Diagnosis not present

## 2015-09-24 DIAGNOSIS — R32 Unspecified urinary incontinence: Secondary | ICD-10-CM | POA: Diagnosis not present

## 2015-10-05 DIAGNOSIS — E782 Mixed hyperlipidemia: Secondary | ICD-10-CM | POA: Diagnosis not present

## 2015-10-05 DIAGNOSIS — I10 Essential (primary) hypertension: Secondary | ICD-10-CM | POA: Diagnosis not present

## 2015-10-05 DIAGNOSIS — K219 Gastro-esophageal reflux disease without esophagitis: Secondary | ICD-10-CM | POA: Diagnosis not present

## 2015-10-05 DIAGNOSIS — R7301 Impaired fasting glucose: Secondary | ICD-10-CM | POA: Diagnosis not present

## 2015-12-03 DIAGNOSIS — R32 Unspecified urinary incontinence: Secondary | ICD-10-CM | POA: Diagnosis not present

## 2016-01-04 DIAGNOSIS — S70211A Abrasion, right hip, initial encounter: Secondary | ICD-10-CM | POA: Diagnosis not present

## 2016-01-04 DIAGNOSIS — L0390 Cellulitis, unspecified: Secondary | ICD-10-CM | POA: Diagnosis not present

## 2016-01-15 DIAGNOSIS — K219 Gastro-esophageal reflux disease without esophagitis: Secondary | ICD-10-CM | POA: Diagnosis not present

## 2016-01-15 DIAGNOSIS — E782 Mixed hyperlipidemia: Secondary | ICD-10-CM | POA: Diagnosis not present

## 2016-01-15 DIAGNOSIS — R7301 Impaired fasting glucose: Secondary | ICD-10-CM | POA: Diagnosis not present

## 2016-01-15 DIAGNOSIS — F72 Severe intellectual disabilities: Secondary | ICD-10-CM | POA: Diagnosis not present

## 2016-01-15 DIAGNOSIS — I10 Essential (primary) hypertension: Secondary | ICD-10-CM | POA: Diagnosis not present

## 2016-01-16 DIAGNOSIS — R32 Unspecified urinary incontinence: Secondary | ICD-10-CM | POA: Diagnosis not present

## 2016-03-10 DIAGNOSIS — Z6828 Body mass index (BMI) 28.0-28.9, adult: Secondary | ICD-10-CM | POA: Diagnosis not present

## 2016-03-10 DIAGNOSIS — Z0000 Encounter for general adult medical examination without abnormal findings: Secondary | ICD-10-CM | POA: Diagnosis not present

## 2016-03-12 DIAGNOSIS — Z111 Encounter for screening for respiratory tuberculosis: Secondary | ICD-10-CM | POA: Diagnosis not present

## 2016-03-17 DIAGNOSIS — F6381 Intermittent explosive disorder: Secondary | ICD-10-CM | POA: Diagnosis not present

## 2016-04-17 DIAGNOSIS — K219 Gastro-esophageal reflux disease without esophagitis: Secondary | ICD-10-CM | POA: Diagnosis not present

## 2016-04-17 DIAGNOSIS — I10 Essential (primary) hypertension: Secondary | ICD-10-CM | POA: Diagnosis not present

## 2016-04-17 DIAGNOSIS — E782 Mixed hyperlipidemia: Secondary | ICD-10-CM | POA: Diagnosis not present

## 2016-04-17 DIAGNOSIS — R7301 Impaired fasting glucose: Secondary | ICD-10-CM | POA: Diagnosis not present

## 2016-04-17 DIAGNOSIS — Z23 Encounter for immunization: Secondary | ICD-10-CM | POA: Diagnosis not present

## 2016-04-17 DIAGNOSIS — F72 Severe intellectual disabilities: Secondary | ICD-10-CM | POA: Diagnosis not present

## 2016-06-21 DIAGNOSIS — J189 Pneumonia, unspecified organism: Secondary | ICD-10-CM | POA: Diagnosis not present

## 2016-07-08 DIAGNOSIS — F6381 Intermittent explosive disorder: Secondary | ICD-10-CM | POA: Diagnosis not present

## 2016-07-15 DIAGNOSIS — J00 Acute nasopharyngitis [common cold]: Secondary | ICD-10-CM | POA: Diagnosis not present

## 2016-07-23 DIAGNOSIS — E782 Mixed hyperlipidemia: Secondary | ICD-10-CM | POA: Diagnosis not present

## 2016-07-23 DIAGNOSIS — K219 Gastro-esophageal reflux disease without esophagitis: Secondary | ICD-10-CM | POA: Diagnosis not present

## 2016-07-23 DIAGNOSIS — R7301 Impaired fasting glucose: Secondary | ICD-10-CM | POA: Diagnosis not present

## 2016-07-23 DIAGNOSIS — I10 Essential (primary) hypertension: Secondary | ICD-10-CM | POA: Diagnosis not present

## 2016-07-24 DIAGNOSIS — Z23 Encounter for immunization: Secondary | ICD-10-CM | POA: Diagnosis not present

## 2016-07-25 DIAGNOSIS — E782 Mixed hyperlipidemia: Secondary | ICD-10-CM | POA: Diagnosis not present

## 2016-07-25 DIAGNOSIS — I10 Essential (primary) hypertension: Secondary | ICD-10-CM | POA: Diagnosis not present

## 2016-07-25 DIAGNOSIS — R7301 Impaired fasting glucose: Secondary | ICD-10-CM | POA: Diagnosis not present

## 2016-09-14 DIAGNOSIS — L0390 Cellulitis, unspecified: Secondary | ICD-10-CM | POA: Diagnosis not present

## 2016-09-14 DIAGNOSIS — R062 Wheezing: Secondary | ICD-10-CM | POA: Diagnosis not present

## 2016-09-14 DIAGNOSIS — J209 Acute bronchitis, unspecified: Secondary | ICD-10-CM | POA: Diagnosis not present

## 2016-09-23 DIAGNOSIS — R32 Unspecified urinary incontinence: Secondary | ICD-10-CM | POA: Diagnosis not present

## 2016-10-07 DIAGNOSIS — J060 Acute laryngopharyngitis: Secondary | ICD-10-CM | POA: Diagnosis not present

## 2016-10-24 DIAGNOSIS — E782 Mixed hyperlipidemia: Secondary | ICD-10-CM | POA: Diagnosis not present

## 2016-10-24 DIAGNOSIS — K219 Gastro-esophageal reflux disease without esophagitis: Secondary | ICD-10-CM | POA: Diagnosis not present

## 2016-10-24 DIAGNOSIS — F72 Severe intellectual disabilities: Secondary | ICD-10-CM | POA: Diagnosis not present

## 2016-10-24 DIAGNOSIS — R7303 Prediabetes: Secondary | ICD-10-CM | POA: Diagnosis not present

## 2016-10-24 DIAGNOSIS — I10 Essential (primary) hypertension: Secondary | ICD-10-CM | POA: Diagnosis not present

## 2016-11-17 DIAGNOSIS — R32 Unspecified urinary incontinence: Secondary | ICD-10-CM | POA: Diagnosis not present

## 2016-11-18 DIAGNOSIS — F6381 Intermittent explosive disorder: Secondary | ICD-10-CM | POA: Diagnosis not present

## 2017-01-01 DIAGNOSIS — R32 Unspecified urinary incontinence: Secondary | ICD-10-CM | POA: Diagnosis not present

## 2017-03-05 DIAGNOSIS — R32 Unspecified urinary incontinence: Secondary | ICD-10-CM | POA: Diagnosis not present

## 2017-03-12 DIAGNOSIS — R32 Unspecified urinary incontinence: Secondary | ICD-10-CM | POA: Diagnosis not present

## 2017-03-21 DIAGNOSIS — Z79899 Other long term (current) drug therapy: Secondary | ICD-10-CM | POA: Diagnosis not present

## 2017-03-21 DIAGNOSIS — K219 Gastro-esophageal reflux disease without esophagitis: Secondary | ICD-10-CM | POA: Diagnosis not present

## 2017-03-21 DIAGNOSIS — R509 Fever, unspecified: Secondary | ICD-10-CM | POA: Diagnosis not present

## 2017-03-21 DIAGNOSIS — R0600 Dyspnea, unspecified: Secondary | ICD-10-CM | POA: Diagnosis not present

## 2017-03-21 DIAGNOSIS — R531 Weakness: Secondary | ICD-10-CM | POA: Diagnosis not present

## 2017-03-21 DIAGNOSIS — I10 Essential (primary) hypertension: Secondary | ICD-10-CM | POA: Diagnosis not present

## 2017-04-09 DIAGNOSIS — S0181XA Laceration without foreign body of other part of head, initial encounter: Secondary | ICD-10-CM | POA: Diagnosis not present

## 2017-04-09 DIAGNOSIS — S0993XA Unspecified injury of face, initial encounter: Secondary | ICD-10-CM | POA: Diagnosis not present

## 2017-04-09 DIAGNOSIS — S0990XA Unspecified injury of head, initial encounter: Secondary | ICD-10-CM | POA: Diagnosis not present

## 2017-04-09 DIAGNOSIS — Z23 Encounter for immunization: Secondary | ICD-10-CM | POA: Diagnosis not present

## 2017-04-09 DIAGNOSIS — S199XXA Unspecified injury of neck, initial encounter: Secondary | ICD-10-CM | POA: Diagnosis not present

## 2017-04-09 DIAGNOSIS — R51 Headache: Secondary | ICD-10-CM | POA: Diagnosis not present

## 2017-04-14 DIAGNOSIS — I10 Essential (primary) hypertension: Secondary | ICD-10-CM | POA: Diagnosis not present

## 2017-04-14 DIAGNOSIS — F72 Severe intellectual disabilities: Secondary | ICD-10-CM | POA: Diagnosis not present

## 2017-04-14 DIAGNOSIS — Z23 Encounter for immunization: Secondary | ICD-10-CM | POA: Diagnosis not present

## 2017-04-14 DIAGNOSIS — E782 Mixed hyperlipidemia: Secondary | ICD-10-CM | POA: Diagnosis not present

## 2017-04-14 DIAGNOSIS — K219 Gastro-esophageal reflux disease without esophagitis: Secondary | ICD-10-CM | POA: Diagnosis not present

## 2017-04-14 DIAGNOSIS — Z79899 Other long term (current) drug therapy: Secondary | ICD-10-CM | POA: Diagnosis not present

## 2017-04-14 DIAGNOSIS — R7303 Prediabetes: Secondary | ICD-10-CM | POA: Diagnosis not present

## 2017-04-17 DIAGNOSIS — S0181XA Laceration without foreign body of other part of head, initial encounter: Secondary | ICD-10-CM | POA: Diagnosis not present

## 2017-04-17 DIAGNOSIS — Z4802 Encounter for removal of sutures: Secondary | ICD-10-CM | POA: Diagnosis not present

## 2017-04-30 DIAGNOSIS — R32 Unspecified urinary incontinence: Secondary | ICD-10-CM | POA: Diagnosis not present

## 2017-05-26 DIAGNOSIS — F6381 Intermittent explosive disorder: Secondary | ICD-10-CM | POA: Diagnosis not present

## 2017-07-04 DIAGNOSIS — L0231 Cutaneous abscess of buttock: Secondary | ICD-10-CM | POA: Diagnosis not present

## 2017-07-04 DIAGNOSIS — F72 Severe intellectual disabilities: Secondary | ICD-10-CM | POA: Diagnosis not present

## 2017-07-04 DIAGNOSIS — F840 Autistic disorder: Secondary | ICD-10-CM | POA: Diagnosis not present

## 2017-07-04 DIAGNOSIS — Z79899 Other long term (current) drug therapy: Secondary | ICD-10-CM | POA: Diagnosis not present

## 2017-07-14 DIAGNOSIS — K219 Gastro-esophageal reflux disease without esophagitis: Secondary | ICD-10-CM | POA: Diagnosis not present

## 2017-07-14 DIAGNOSIS — R7303 Prediabetes: Secondary | ICD-10-CM | POA: Diagnosis not present

## 2017-07-14 DIAGNOSIS — I10 Essential (primary) hypertension: Secondary | ICD-10-CM | POA: Diagnosis not present

## 2017-07-14 DIAGNOSIS — F72 Severe intellectual disabilities: Secondary | ICD-10-CM | POA: Diagnosis not present

## 2017-07-14 DIAGNOSIS — E782 Mixed hyperlipidemia: Secondary | ICD-10-CM | POA: Diagnosis not present

## 2017-09-03 DIAGNOSIS — R32 Unspecified urinary incontinence: Secondary | ICD-10-CM | POA: Diagnosis not present

## 2017-10-05 DIAGNOSIS — F419 Anxiety disorder, unspecified: Secondary | ICD-10-CM | POA: Diagnosis not present

## 2017-12-16 DIAGNOSIS — E782 Mixed hyperlipidemia: Secondary | ICD-10-CM | POA: Diagnosis not present

## 2017-12-16 DIAGNOSIS — K219 Gastro-esophageal reflux disease without esophagitis: Secondary | ICD-10-CM | POA: Diagnosis not present

## 2017-12-16 DIAGNOSIS — F72 Severe intellectual disabilities: Secondary | ICD-10-CM | POA: Diagnosis not present

## 2017-12-16 DIAGNOSIS — R7303 Prediabetes: Secondary | ICD-10-CM | POA: Diagnosis not present

## 2017-12-16 DIAGNOSIS — I10 Essential (primary) hypertension: Secondary | ICD-10-CM | POA: Diagnosis not present

## 2017-12-31 DIAGNOSIS — R32 Unspecified urinary incontinence: Secondary | ICD-10-CM | POA: Diagnosis not present

## 2018-01-19 DIAGNOSIS — Z6829 Body mass index (BMI) 29.0-29.9, adult: Secondary | ICD-10-CM | POA: Diagnosis not present

## 2018-01-19 DIAGNOSIS — Z0000 Encounter for general adult medical examination without abnormal findings: Secondary | ICD-10-CM | POA: Diagnosis not present

## 2018-01-25 DIAGNOSIS — F6381 Intermittent explosive disorder: Secondary | ICD-10-CM | POA: Diagnosis not present

## 2018-03-19 DIAGNOSIS — I10 Essential (primary) hypertension: Secondary | ICD-10-CM | POA: Diagnosis not present

## 2018-03-19 DIAGNOSIS — R7303 Prediabetes: Secondary | ICD-10-CM | POA: Diagnosis not present

## 2018-03-19 DIAGNOSIS — Z111 Encounter for screening for respiratory tuberculosis: Secondary | ICD-10-CM | POA: Diagnosis not present

## 2018-03-19 DIAGNOSIS — E782 Mixed hyperlipidemia: Secondary | ICD-10-CM | POA: Diagnosis not present

## 2018-03-19 DIAGNOSIS — F72 Severe intellectual disabilities: Secondary | ICD-10-CM | POA: Diagnosis not present

## 2018-03-19 DIAGNOSIS — K219 Gastro-esophageal reflux disease without esophagitis: Secondary | ICD-10-CM | POA: Diagnosis not present

## 2018-04-01 DIAGNOSIS — B351 Tinea unguium: Secondary | ICD-10-CM | POA: Diagnosis not present

## 2018-04-01 DIAGNOSIS — L03312 Cellulitis of back [any part except buttock]: Secondary | ICD-10-CM | POA: Diagnosis not present

## 2018-04-27 ENCOUNTER — Ambulatory Visit (INDEPENDENT_AMBULATORY_CARE_PROVIDER_SITE_OTHER): Payer: Medicare Other | Admitting: Podiatry

## 2018-04-27 DIAGNOSIS — B351 Tinea unguium: Secondary | ICD-10-CM | POA: Diagnosis not present

## 2018-04-27 DIAGNOSIS — F419 Anxiety disorder, unspecified: Secondary | ICD-10-CM | POA: Diagnosis not present

## 2018-04-27 DIAGNOSIS — M79676 Pain in unspecified toe(s): Secondary | ICD-10-CM

## 2018-04-27 DIAGNOSIS — M79609 Pain in unspecified limb: Principal | ICD-10-CM

## 2018-04-27 NOTE — Progress Notes (Signed)
  Subjective:  Patient ID: [name], male    DOB: 1961-04-14,  MRN: [id number]  Chief Complaint  Patient presents with  . debride    BL nail trimming    57 y.o. male presents with the above complaint.  Here from facility for concerns of nails are so long that they are cutting into his feet.  Patient minimally communicative and does not assist with a history  Review of Systems: Negative except as noted in the HPI. Denies N/V/F/Ch.  No past medical history on file. No current outpatient medications on file.  Social History   Tobacco Use  Smoking Status Not on file    Not on File Objective:  There were no vitals filed for this visit. There is no height or weight on file to calculate BMI. Constitutional Well developed. Well nourished. Poor pedal hygiene  Vascular Dorsalis pedis pulses palpable bilaterally. Posterior tibial pulses palpable bilaterally. Capillary refill normal to all digits.  No cyanosis or clubbing noted. Pedal hair growth normal.  Neurologic Patient minimally communicative Response to painful stimuli  Dermatologic Nails elongated dystrophic pain to palpation with nails so long there ingrowing into the skin No open wounds. No skin lesions.  Orthopedic: Normal joint ROM without pain or crepitus bilaterally. No visible deformities. No bony tenderness.   Radiographs: None Assessment:   1. Pain due to onychomycosis of nail    Plan:  Patient was evaluated and treated and all questions answered.  Onychomycosis with pain -Nails palliatively debridement as below -Educated on self-care  Procedure: Nail Debridement Rationale: Pain Type of Debridement: manual, sharp debridement. Instrumentation: Nail nipper, rotary burr. Number of Nails: 10    Return if symptoms worsen or fail to improve.

## 2018-06-21 DIAGNOSIS — F72 Severe intellectual disabilities: Secondary | ICD-10-CM | POA: Diagnosis not present

## 2018-06-21 DIAGNOSIS — E782 Mixed hyperlipidemia: Secondary | ICD-10-CM | POA: Diagnosis not present

## 2018-06-21 DIAGNOSIS — K219 Gastro-esophageal reflux disease without esophagitis: Secondary | ICD-10-CM | POA: Diagnosis not present

## 2018-06-21 DIAGNOSIS — Z23 Encounter for immunization: Secondary | ICD-10-CM | POA: Diagnosis not present

## 2018-06-21 DIAGNOSIS — R7303 Prediabetes: Secondary | ICD-10-CM | POA: Diagnosis not present

## 2018-06-21 DIAGNOSIS — I10 Essential (primary) hypertension: Secondary | ICD-10-CM | POA: Diagnosis not present

## 2018-09-13 DIAGNOSIS — F419 Anxiety disorder, unspecified: Secondary | ICD-10-CM | POA: Diagnosis not present

## 2018-12-29 DIAGNOSIS — K219 Gastro-esophageal reflux disease without esophagitis: Secondary | ICD-10-CM | POA: Diagnosis not present

## 2018-12-29 DIAGNOSIS — E782 Mixed hyperlipidemia: Secondary | ICD-10-CM | POA: Diagnosis not present

## 2018-12-29 DIAGNOSIS — F72 Severe intellectual disabilities: Secondary | ICD-10-CM | POA: Diagnosis not present

## 2018-12-29 DIAGNOSIS — R7303 Prediabetes: Secondary | ICD-10-CM | POA: Diagnosis not present

## 2018-12-29 DIAGNOSIS — I10 Essential (primary) hypertension: Secondary | ICD-10-CM | POA: Diagnosis not present

## 2019-03-24 DIAGNOSIS — Z0000 Encounter for general adult medical examination without abnormal findings: Secondary | ICD-10-CM | POA: Diagnosis not present

## 2019-03-24 DIAGNOSIS — Z6829 Body mass index (BMI) 29.0-29.9, adult: Secondary | ICD-10-CM | POA: Diagnosis not present

## 2019-03-24 DIAGNOSIS — Z79899 Other long term (current) drug therapy: Secondary | ICD-10-CM | POA: Diagnosis not present

## 2019-05-05 DIAGNOSIS — Z23 Encounter for immunization: Secondary | ICD-10-CM | POA: Diagnosis not present

## 2019-05-05 DIAGNOSIS — E782 Mixed hyperlipidemia: Secondary | ICD-10-CM | POA: Diagnosis not present

## 2019-05-05 DIAGNOSIS — K219 Gastro-esophageal reflux disease without esophagitis: Secondary | ICD-10-CM | POA: Diagnosis not present

## 2019-05-05 DIAGNOSIS — R7303 Prediabetes: Secondary | ICD-10-CM | POA: Diagnosis not present

## 2019-05-05 DIAGNOSIS — Z111 Encounter for screening for respiratory tuberculosis: Secondary | ICD-10-CM | POA: Diagnosis not present

## 2019-05-05 DIAGNOSIS — F72 Severe intellectual disabilities: Secondary | ICD-10-CM | POA: Diagnosis not present

## 2019-05-05 DIAGNOSIS — I10 Essential (primary) hypertension: Secondary | ICD-10-CM | POA: Diagnosis not present

## 2019-06-29 DIAGNOSIS — J069 Acute upper respiratory infection, unspecified: Secondary | ICD-10-CM | POA: Diagnosis not present

## 2019-07-05 DIAGNOSIS — H6502 Acute serous otitis media, left ear: Secondary | ICD-10-CM | POA: Diagnosis not present

## 2019-07-29 HISTORY — PX: LAPAROTOMY: SHX154

## 2019-08-01 DIAGNOSIS — F419 Anxiety disorder, unspecified: Secondary | ICD-10-CM | POA: Diagnosis not present

## 2019-09-08 ENCOUNTER — Other Ambulatory Visit: Payer: Self-pay

## 2019-09-08 ENCOUNTER — Encounter: Payer: Self-pay | Admitting: Family Medicine

## 2019-09-08 ENCOUNTER — Ambulatory Visit (INDEPENDENT_AMBULATORY_CARE_PROVIDER_SITE_OTHER): Payer: Medicare Other | Admitting: Family Medicine

## 2019-09-08 VITALS — BP 124/80 | HR 72 | Temp 96.4°F | Resp 16 | Ht 69.0 in | Wt 208.6 lb

## 2019-09-08 DIAGNOSIS — F840 Autistic disorder: Secondary | ICD-10-CM

## 2019-09-08 DIAGNOSIS — F79 Unspecified intellectual disabilities: Secondary | ICD-10-CM

## 2019-09-08 DIAGNOSIS — R7309 Other abnormal glucose: Secondary | ICD-10-CM | POA: Diagnosis not present

## 2019-09-08 DIAGNOSIS — I10 Essential (primary) hypertension: Secondary | ICD-10-CM | POA: Diagnosis not present

## 2019-09-08 DIAGNOSIS — K219 Gastro-esophageal reflux disease without esophagitis: Secondary | ICD-10-CM | POA: Diagnosis not present

## 2019-09-08 DIAGNOSIS — F330 Major depressive disorder, recurrent, mild: Secondary | ICD-10-CM

## 2019-09-08 DIAGNOSIS — Z6830 Body mass index (BMI) 30.0-30.9, adult: Secondary | ICD-10-CM | POA: Diagnosis not present

## 2019-09-08 DIAGNOSIS — E782 Mixed hyperlipidemia: Secondary | ICD-10-CM

## 2019-09-08 MED ORDER — STARCH (THICKENING) PO LIQD: ORAL | 11 refills | Status: DC

## 2019-09-09 LAB — COMP. METABOLIC PANEL (12)
AST: 20 IU/L (ref 0–40)
Albumin/Globulin Ratio: 1.3 (ref 1.2–2.2)
Albumin: 4 g/dL (ref 3.8–4.9)
Alkaline Phosphatase: 98 IU/L (ref 39–117)
BUN/Creatinine Ratio: 14 (ref 9–20)
BUN: 12 mg/dL (ref 6–24)
Bilirubin Total: 0.3 mg/dL (ref 0.0–1.2)
Calcium: 9.6 mg/dL (ref 8.7–10.2)
Chloride: 102 mmol/L (ref 96–106)
Creatinine, Ser: 0.85 mg/dL (ref 0.76–1.27)
GFR calc Af Amer: 112 mL/min/{1.73_m2} (ref >59)
GFR calc non Af Amer: 97 mL/min/{1.73_m2} (ref >59)
Globulin, Total: 3 g/dL (ref 1.5–4.5)
Glucose: 99 mg/dL (ref 65–99)
Potassium: 4.2 mmol/L (ref 3.5–5.2)
Sodium: 140 mmol/L (ref 134–144)
Total Protein: 7 g/dL (ref 6.0–8.5)

## 2019-09-09 LAB — CBC WITH DIFFERENTIAL/PLATELET
Basophils Absolute: 0.1 10*3/uL (ref 0.0–0.2)
Basos: 1 %
EOS (ABSOLUTE): 0.3 10*3/uL (ref 0.0–0.4)
Eos: 4 %
Hematocrit: 43.4 % (ref 37.5–51.0)
Hemoglobin: 14.7 g/dL (ref 13.0–17.7)
Immature Grans (Abs): 0 10*3/uL (ref 0.0–0.1)
Immature Granulocytes: 1 %
Lymphocytes Absolute: 1.6 10*3/uL (ref 0.7–3.1)
Lymphs: 20 %
MCH: 29.6 pg (ref 26.6–33.0)
MCHC: 33.9 g/dL (ref 31.5–35.7)
MCV: 88 fL (ref 79–97)
Monocytes Absolute: 0.7 10*3/uL (ref 0.1–0.9)
Monocytes: 8 %
Neutrophils Absolute: 5.3 10*3/uL (ref 1.4–7.0)
Neutrophils: 66 %
Platelets: 329 10*3/uL (ref 150–450)
RBC: 4.96 x10E6/uL (ref 4.14–5.80)
RDW: 12.7 % (ref 11.6–15.4)
WBC: 8 10*3/uL (ref 3.4–10.8)

## 2019-09-09 LAB — LIPID PANEL
Chol/HDL Ratio: 4.6 ratio (ref 0.0–5.0)
Cholesterol, Total: 178 mg/dL (ref 100–199)
HDL: 39 mg/dL — ABNORMAL LOW (ref >39)
LDL Chol Calc (NIH): 121 mg/dL — ABNORMAL HIGH (ref 0–99)
Triglycerides: 98 mg/dL (ref 0–149)
VLDL Cholesterol Cal: 18 mg/dL (ref 5–40)

## 2019-09-09 LAB — CARDIOVASCULAR RISK ASSESSMENT

## 2019-09-09 LAB — HEMOGLOBIN A1C
Est. average glucose Bld gHb Est-mCnc: 120 mg/dL
Hgb A1c MFr Bld: 5.8 % — ABNORMAL HIGH (ref 4.8–5.6)

## 2019-09-14 ENCOUNTER — Other Ambulatory Visit: Payer: Self-pay

## 2019-09-14 MED ORDER — LACTULOSE 10 GM/15ML PO SOLN: 30.0000 g | Freq: Three times a day (TID) | ORAL | 2 refills | Status: DC

## 2019-09-15 DIAGNOSIS — Z23 Encounter for immunization: Secondary | ICD-10-CM | POA: Diagnosis not present

## 2019-09-26 DIAGNOSIS — R7309 Other abnormal glucose: Secondary | ICD-10-CM | POA: Insufficient documentation

## 2019-09-26 DIAGNOSIS — K219 Gastro-esophageal reflux disease without esophagitis: Secondary | ICD-10-CM

## 2019-09-26 DIAGNOSIS — E782 Mixed hyperlipidemia: Secondary | ICD-10-CM | POA: Insufficient documentation

## 2019-09-26 DIAGNOSIS — F330 Major depressive disorder, recurrent, mild: Secondary | ICD-10-CM | POA: Insufficient documentation

## 2019-09-26 DIAGNOSIS — Z6830 Body mass index (BMI) 30.0-30.9, adult: Secondary | ICD-10-CM

## 2019-09-26 DIAGNOSIS — F79 Unspecified intellectual disabilities: Secondary | ICD-10-CM | POA: Insufficient documentation

## 2019-09-26 DIAGNOSIS — F840 Autistic disorder: Secondary | ICD-10-CM | POA: Insufficient documentation

## 2019-09-26 DIAGNOSIS — I10 Essential (primary) hypertension: Secondary | ICD-10-CM | POA: Insufficient documentation

## 2019-09-26 HISTORY — DX: Gastro-esophageal reflux disease without esophagitis: K21.9

## 2019-09-26 HISTORY — DX: Body mass index (BMI) 30.0-30.9, adult: Z68.30

## 2019-09-26 HISTORY — DX: Major depressive disorder, recurrent, mild: F33.0

## 2019-09-26 NOTE — Assessment & Plan Note (Signed)
Well controlled.   Continue to work on eating a healthy diet and exercise.  Labs drawn today.   

## 2019-09-26 NOTE — Assessment & Plan Note (Signed)
Management per Dr. Alinda Sierras.

## 2019-09-26 NOTE — Patient Instructions (Signed)
Essential hypertension, benign Well controlled.  No changes to medicines.  Continue to work on eating a healthy diet and exercise.  Labs drawn today.   Gastroesophageal reflux disease without esophagitis Well controlled.  No changes to medicines.  Thickening substance for water written.  Autism Management per Dr. Alinda Sierras.  Depression, major, recurrent, mild ([place]) Management per Dr. Alinda Sierras  Elevated glucose Well controlled.  Continue to work on eating a healthy diet and exercise.  Labs drawn today.   Mentally disabled Continue current care at group home.  Management by Dr. Barrett Shell and myself. No current behavioral problems.  Mixed hyperlipidemia Well controlled.  No changes to medicines.  Continue to work on eating a healthy diet and exercise.  Labs drawn today.

## 2019-09-26 NOTE — Assessment & Plan Note (Signed)
Well controlled.  ?No changes to medicines.  ?Continue to work on eating a healthy diet and exercise.  ?Labs drawn today.  ?

## 2019-09-26 NOTE — Assessment & Plan Note (Signed)
Well controlled.  No changes to medicines.  Thickening substance for water written.

## 2019-09-26 NOTE — Assessment & Plan Note (Signed)
Management per Dr. Alinda Sierras

## 2019-09-26 NOTE — Progress Notes (Signed)
Subjective:  Patient ID: [name], male    DOB: 08-14-1960  Age: 59 y.o. MRN: [id number]  Chief Complaint  Patient presents with  . Hyperlipidemia  . Hypertension    HPI Patient is a 59 year old white male with severe intellectual disabilities who lives in a group home and is essentially nonverbal. He has hypertension, hyperlipidemia, prediabetes, and gerd. He sees Dr. Alinda Sierras for his psychiatric care. He eats healthy and walks for exercise. He is not overweight. He has a history of aspiration and is currently on thickened liquid diet. His bowel and bladder activity are good without incontinence. He seems to understand and follow basic directions.   Social History   Socioeconomic History  . Marital status: Single    Spouse name: Not on file  . Number of children: Not on file  . Years of education: Not on file  . Highest education level: Not on file  Occupational History  . Not on file  Tobacco Use  . Smoking status: Never Smoker  . Smokeless tobacco: Never Used  Substance and Sexual Activity  . Alcohol use: Never  . Drug use: Never  . Sexual activity: Not on file  Other Topics Concern  . Not on file  Social History Narrative  . Not on file   Social Determinants of Health   Financial Resource Strain:   . Difficulty of Paying Living Expenses: Not on file  Food Insecurity:   . Worried About Charity fundraiser in the Last Year: Not on file  . Ran Out of Food in the Last Year: Not on file  Transportation Needs:   . Lack of Transportation (Medical): Not on file  . Lack of Transportation (Non-Medical): Not on file  Physical Activity:   . Days of Exercise per Week: Not on file  . Minutes of Exercise per Session: Not on file  Stress:   . Feeling of Stress : Not on file  Social Connections:   . Frequency of Communication with Friends and Family: Not on file  . Frequency of Social Gatherings with Friends and Family: Not on file  . Attends Religious Services: Not  on file  . Active Member of Clubs or Organizations: Not on file  . Attends Archivist Meetings: Not on file  . Marital Status: Not on file   No past medical history on file. No family history on file.  Review of Systems Review of Systems  Constitutional: Negative for chills, diaphoresis, fever and malaise/fatigue.  HENT: Negative for congestion, ear pain and sore throat.   Respiratory: Negative for cough and shortness of breath.   Cardiovascular: Negative for chest pain and palpitations.  Gastrointestinal: Negative for abdominal pain, constipation, diarrhea, nausea and vomiting.  Genitourinary: Negative for dysuria and urgency.  Musculoskeletal: Negative for myalgias. Negative for arthralgias. Skin: no abnormal moles or rashes. Psychiatric/Behavioral: difficult to assess.   Objective:  BP 124/80 (BP Location: Left Arm, Patient Position: Sitting, Cuff Size: Normal)   Pulse 72   Temp (!) 96.4 F (35.8 C)   Resp 16   Ht 5\' 9"  (1.753 m)   Wt 208 lb 9.6 oz (94.6 kg)   BMI 30.80 kg/m   BP/Weight 123XX123  Systolic BP [id number]  Diastolic BP 80  Wt. (Lbs) 208.6  BMI 30.8    Physical Exam Vitals reviewed.  Constitutional:      Appearance: Normal appearance.     Comments: No distress, but pt moans through out appointment and rocks. This increases if  he becomes agitated.   HENT:     Right Ear: Tympanic membrane and ear canal normal.     Left Ear: Tympanic membrane and ear canal normal.  Neck:     Vascular: No carotid bruit.  Cardiovascular:     Rate and Rhythm: Normal rate and regular rhythm.     Heart sounds: Normal heart sounds.  Pulmonary:     Effort: Pulmonary effort is normal.     Breath sounds: Normal breath sounds. No wheezing, rhonchi or rales.  Abdominal:     General: Abdomen is flat.     Palpations: Abdomen is soft. There is no mass.     Tenderness: There is no abdominal tenderness.  Skin:    General: Skin is warm.  Neurological:     Mental Status: He  is alert.  Psychiatric:     Comments: Rocks and moans through out exam. He is cooperative.           Assessment & Plan:   No problem-specific Assessment & Plan notes found for this encounter.   Meds ordered this encounter  Medications  . Starch, Thickening, LIQD    Sig: 3 tsps per 4 oz of liquid. Recommend 12 oz liquid (water,tea, or juice) three times a day.    Dispense:  237 mL    Refill:  11       Follow-up: No follow-ups on file.  AVS was given to patient prior to departure.  Rochel Brome [facility] [phone]

## 2019-09-26 NOTE — Assessment & Plan Note (Addendum)
Continue current care at group home.  Management by Dr. Barrett Shell and myself. No current behavioral problems.

## 2019-10-06 ENCOUNTER — Other Ambulatory Visit: Payer: Self-pay

## 2019-10-06 MED ORDER — VALSARTAN 40 MG PO TABS: 40.0000 mg | ORAL_TABLET | Freq: Every day | ORAL | 0 refills | Status: DC

## 2019-10-06 MED ORDER — ATORVASTATIN CALCIUM 10 MG PO TABS: 10.0000 mg | ORAL_TABLET | Freq: Every day | ORAL | 0 refills | Status: DC

## 2019-10-17 DIAGNOSIS — E876 Hypokalemia: Secondary | ICD-10-CM | POA: Diagnosis present

## 2019-10-17 DIAGNOSIS — K219 Gastro-esophageal reflux disease without esophagitis: Secondary | ICD-10-CM | POA: Diagnosis not present

## 2019-10-17 DIAGNOSIS — R404 Transient alteration of awareness: Secondary | ICD-10-CM | POA: Diagnosis not present

## 2019-10-17 DIAGNOSIS — R6883 Chills (without fever): Secondary | ICD-10-CM | POA: Diagnosis not present

## 2019-10-17 DIAGNOSIS — J180 Bronchopneumonia, unspecified organism: Secondary | ICD-10-CM | POA: Diagnosis not present

## 2019-10-17 DIAGNOSIS — J189 Pneumonia, unspecified organism: Secondary | ICD-10-CM | POA: Diagnosis not present

## 2019-10-17 DIAGNOSIS — F840 Autistic disorder: Secondary | ICD-10-CM | POA: Diagnosis present

## 2019-10-17 DIAGNOSIS — Z79899 Other long term (current) drug therapy: Secondary | ICD-10-CM | POA: Diagnosis not present

## 2019-10-17 DIAGNOSIS — J9601 Acute respiratory failure with hypoxia: Secondary | ICD-10-CM | POA: Diagnosis not present

## 2019-10-17 DIAGNOSIS — R509 Fever, unspecified: Secondary | ICD-10-CM | POA: Diagnosis not present

## 2019-10-17 DIAGNOSIS — I959 Hypotension, unspecified: Secondary | ICD-10-CM | POA: Diagnosis not present

## 2019-10-17 DIAGNOSIS — J9811 Atelectasis: Secondary | ICD-10-CM | POA: Diagnosis not present

## 2019-10-17 DIAGNOSIS — R0989 Other specified symptoms and signs involving the circulatory and respiratory systems: Secondary | ICD-10-CM | POA: Diagnosis not present

## 2019-10-17 DIAGNOSIS — I491 Atrial premature depolarization: Secondary | ICD-10-CM | POA: Diagnosis not present

## 2019-10-17 DIAGNOSIS — A419 Sepsis, unspecified organism: Secondary | ICD-10-CM | POA: Diagnosis not present

## 2019-10-17 DIAGNOSIS — R000 Tachycardia, unspecified: Secondary | ICD-10-CM | POA: Diagnosis not present

## 2019-10-17 DIAGNOSIS — R4182 Altered mental status, unspecified: Secondary | ICD-10-CM | POA: Diagnosis not present

## 2019-10-17 DIAGNOSIS — I10 Essential (primary) hypertension: Secondary | ICD-10-CM | POA: Diagnosis present

## 2019-10-17 DIAGNOSIS — R0602 Shortness of breath: Secondary | ICD-10-CM | POA: Diagnosis not present

## 2019-10-17 DIAGNOSIS — J690 Pneumonitis due to inhalation of food and vomit: Secondary | ICD-10-CM | POA: Diagnosis not present

## 2019-10-17 DIAGNOSIS — R0902 Hypoxemia: Secondary | ICD-10-CM | POA: Diagnosis not present

## 2019-10-18 DIAGNOSIS — J189 Pneumonia, unspecified organism: Secondary | ICD-10-CM | POA: Diagnosis not present

## 2019-10-18 DIAGNOSIS — R0902 Hypoxemia: Secondary | ICD-10-CM | POA: Diagnosis not present

## 2019-10-18 DIAGNOSIS — I10 Essential (primary) hypertension: Secondary | ICD-10-CM | POA: Diagnosis not present

## 2019-10-18 DIAGNOSIS — A419 Sepsis, unspecified organism: Secondary | ICD-10-CM | POA: Diagnosis not present

## 2019-10-19 DIAGNOSIS — A419 Sepsis, unspecified organism: Secondary | ICD-10-CM | POA: Diagnosis not present

## 2019-10-19 DIAGNOSIS — I10 Essential (primary) hypertension: Secondary | ICD-10-CM | POA: Diagnosis not present

## 2019-10-19 DIAGNOSIS — R0902 Hypoxemia: Secondary | ICD-10-CM | POA: Diagnosis not present

## 2019-10-19 DIAGNOSIS — J189 Pneumonia, unspecified organism: Secondary | ICD-10-CM | POA: Diagnosis not present

## 2019-10-20 DIAGNOSIS — J189 Pneumonia, unspecified organism: Secondary | ICD-10-CM | POA: Diagnosis not present

## 2019-10-20 DIAGNOSIS — R0902 Hypoxemia: Secondary | ICD-10-CM | POA: Diagnosis not present

## 2019-10-20 DIAGNOSIS — I10 Essential (primary) hypertension: Secondary | ICD-10-CM | POA: Diagnosis not present

## 2019-10-20 DIAGNOSIS — A419 Sepsis, unspecified organism: Secondary | ICD-10-CM | POA: Diagnosis not present

## 2019-10-26 ENCOUNTER — Other Ambulatory Visit: Payer: Self-pay | Admitting: Family Medicine

## 2019-11-01 ENCOUNTER — Ambulatory Visit (INDEPENDENT_AMBULATORY_CARE_PROVIDER_SITE_OTHER): Payer: Medicare Other | Admitting: Physician Assistant

## 2019-11-01 ENCOUNTER — Other Ambulatory Visit: Payer: Self-pay

## 2019-11-01 ENCOUNTER — Encounter: Payer: Self-pay | Admitting: Physician Assistant

## 2019-11-01 VITALS — BP 122/80 | HR 100 | Temp 97.7°F | Resp 16 | Wt 202.0 lb

## 2019-11-01 DIAGNOSIS — I10 Essential (primary) hypertension: Secondary | ICD-10-CM | POA: Insufficient documentation

## 2019-11-01 DIAGNOSIS — J690 Pneumonitis due to inhalation of food and vomit: Secondary | ICD-10-CM

## 2019-11-01 DIAGNOSIS — R5383 Other fatigue: Secondary | ICD-10-CM | POA: Diagnosis not present

## 2019-11-01 DIAGNOSIS — F79 Unspecified intellectual disabilities: Secondary | ICD-10-CM | POA: Insufficient documentation

## 2019-11-01 DIAGNOSIS — K219 Gastro-esophageal reflux disease without esophagitis: Secondary | ICD-10-CM | POA: Insufficient documentation

## 2019-11-01 DIAGNOSIS — F419 Anxiety disorder, unspecified: Secondary | ICD-10-CM | POA: Diagnosis not present

## 2019-11-01 DIAGNOSIS — E041 Nontoxic single thyroid nodule: Secondary | ICD-10-CM

## 2019-11-01 DIAGNOSIS — F8189 Other developmental disorders of scholastic skills: Secondary | ICD-10-CM | POA: Insufficient documentation

## 2019-11-01 DIAGNOSIS — F72 Severe intellectual disabilities: Secondary | ICD-10-CM | POA: Insufficient documentation

## 2019-11-01 DIAGNOSIS — R7303 Prediabetes: Secondary | ICD-10-CM | POA: Insufficient documentation

## 2019-11-01 HISTORY — DX: Pneumonitis due to inhalation of food and vomit: J69.0

## 2019-11-01 HISTORY — DX: Nontoxic single thyroid nodule: E04.1

## 2019-11-01 MED ORDER — AZITHROMYCIN 250 MG PO TABS: ORAL_TABLET | ORAL | 0 refills | Status: DC

## 2019-11-01 NOTE — Assessment & Plan Note (Signed)
Will talk about thyroid ultrasound after pt improving from recent history of pneumonia

## 2019-11-01 NOTE — Progress Notes (Signed)
Established Patient Office Visit  Subjective:  Patient ID: [name], male    DOB: 11/22/1960  Age: 59 y.o. MRN: [id number]  CC:  Chief Complaint  Patient presents with  . Pneumonia    HPI [name] presents for follow up from pneumonia Pt was admitted to [facility] over 2 weeks ago for aspiration pneumonia - He was hypoxic upon admission - while there he was treated with IV antibiotics and improved He had a CTA of chest which showed multifocal pneumonia Also incidentally showed thyroid nodules which warrant a thyroid ultrasound (will order when pt feeling better) Caregiver with pt states that he has 'not been himself' since coming back to the group home.  He has been very tired and fatigued Also he had trouble with diarrhea which has now improved.  He was sent home on augmentin for 5 days when in fact it is listed as an allergy for this patient - probable cause of GI upset  Past Medical History:  Diagnosis Date  . Essential hypertension   . GERD without esophagitis   . Mixed hyperlipidemia   . Prediabetes   . Seizure disorder ([place])   . Severe intellectual disabilities     History reviewed. No pertinent surgical history.  History reviewed. No pertinent family history.  Social History   Socioeconomic History  . Marital status: Single    Spouse name: Not on file  . Number of children: Not on file  . Years of education: Not on file  . Highest education level: Not on file  Occupational History  . Not on file  Tobacco Use  . Smoking status: Never Smoker  . Smokeless tobacco: Never Used  Substance and Sexual Activity  . Alcohol use: Never  . Drug use: Never  . Sexual activity: Not on file  Other Topics Concern  . Not on file  Social History Narrative  . Not on file   Social Determinants of Health   Financial Resource Strain:   . Difficulty of Paying Living Expenses:   Food Insecurity:   . Worried About Charity fundraiser in the Last  Year:   . Arboriculturist in the Last Year:   Transportation Needs:   . Film/video editor (Medical):   Marland Kitchen Lack of Transportation (Non-Medical):   Physical Activity:   . Days of Exercise per Week:   . Minutes of Exercise per Session:   Stress:   . Feeling of Stress :   Social Connections:   . Frequency of Communication with Friends and Family:   . Frequency of Social Gatherings with Friends and Family:   . Attends Religious Services:   . Active Member of Clubs or Organizations:   . Attends Archivist Meetings:   Marland Kitchen Marital Status:   Intimate Partner Violence:   . Fear of Current or Ex-Partner:   . Emotionally Abused:   Marland Kitchen Physically Abused:   . Sexually Abused:      Current Outpatient Medications:  .  amLODipine (NORVASC) 10 MG tablet, Take 10 mg by mouth daily., Disp: , Rfl:  .  atorvastatin (LIPITOR) 10 MG tablet, Take 10 mg by mouth daily., Disp: , Rfl:  .  citalopram (CELEXA) 40 MG tablet, Take 40 mg by mouth daily., Disp: , Rfl:  .  clonazePAM (KLONOPIN) 0.5 MG tablet, Take 0.5 mg by mouth 3 (three) times daily as needed. Take 1 tablet every morning, 1 at noon, 1/2 at 4 pm and 1  at bedtime, Disp: , Rfl:  .  Ensure (ENSURE), Take 237 mLs by mouth 2 (two) times daily between meals., Disp: , Rfl:  .  lactulose (CHRONULAC) 10 GM/15ML solution, GIVE 45 ML (30 GM) BY MOUTH 3 TIMES A DAY, Disp: 1892 mL, Rfl: 10 .  loratadine (CLARITIN) 10 MG tablet, Take 10 mg by mouth daily., Disp: , Rfl:  .  montelukast (SINGULAIR) 10 MG tablet, Take 10 mg by mouth daily., Disp: , Rfl:  .  Multiple Vitamin (MULTIVITAMIN) tablet, Take 1 tablet by mouth daily., Disp: , Rfl:  .  pantoprazole (PROTONIX) 40 MG tablet, Take twice daily, Disp: , Rfl:  .  psyllium (REGULOID) 0.52 g capsule, Take 28.3 g by mouth., Disp: , Rfl:  .  QUEtiapine (SEROQUEL) 200 MG tablet, Take 200 mg by mouth. Take 1 every morning, 1 1/2 at noon , 1/2 at 4 pm and 1 at bedtime, Disp: , Rfl:  .  Starch, Thickening,  LIQD, 3 tsps per 4 oz of liquid. Recommend 12 oz liquid (water,tea, or juice) three times a day., Disp: 237 mL, Rfl: 11 .  valsartan (DIOVAN) 40 MG tablet, Take 40 mg by mouth daily. Take 1 daily, Disp: , Rfl:  .  acetaminophen (TYLENOL) 500 MG tablet, Take 500 mg by mouth every 6 (six) hours as needed., Disp: , Rfl:  .  alum & mag hydroxide-simeth (MAALOX/MYLANTA) 200-200-20 MG/5ML suspension, Take 15 mLs by mouth every 6 (six) hours as needed for indigestion or heartburn. Take 15 ml by mouth after meals and at bedtime, Disp: , Rfl:  .  azithromycin (ZITHROMAX) 250 MG tablet, 2 po day one then 1 po days 2-5, Disp: 6 each, Rfl: 0   Allergies  Allergen Reactions  . Augmentin [Amoxicillin-Pot Clavulanate]     ROS CONSTITUTIONAL: Negative for chills, fatigue, fever, unintentional weight gain and unintentional weight loss.  E/N/T: Negative for ear pain, nasal congestion and sore throat.  CARDIOVASCULAR: Negative for chest pain, dizziness, palpitations and pedal edema.  RESPIRATORY: see HPI GASTROINTESTINAL: see HPI         Objective:    PHYSICAL EXAM:   VS: BP 122/80   Pulse 100   Temp 97.7 F (36.5 C)   Resp 16   Wt 202 lb (91.6 kg)   SpO2 93%   BMI 29.83 kg/m   GEN: Well nourished, well developed, in no acute distress --more calm than usual for patient  Oropharynx - normal mucosa, palate, and posterior pharynx  Cardiac: RRR; no murmurs, rubs, or gallops,no edema - no significant varicosities Respiratory:  normal respiratory rate and pattern with no distress -faint scattered rhonchi noted   Skin: warm and dry, no rash  Neuro:  Alert  Psych: euthymic mood - seems tired  BP 122/80   Pulse 100   Temp 97.7 F (36.5 C)   Resp 16   Wt 202 lb (91.6 kg)   SpO2 93%   BMI 29.83 kg/m  Wt Readings from Last 3 Encounters:  11/01/19 202 lb (91.6 kg)  09/08/19 208 lb 9.6 oz (94.6 kg)     Health Maintenance Due  Topic Date Due  . Hepatitis C Screening  Never done  . HIV  Screening  Never done  . TETANUS/TDAP  Never done  . COLONOSCOPY  Never done    There are no preventive care reminders to display for this patient.   Lab Results  Component Value Date   WBC 8.0 09/08/2019   HGB 14.7 09/08/2019  HCT 43.4 09/08/2019   MCV 88 09/08/2019   PLT 329 09/08/2019   Lab Results  Component Value Date   NA 140 09/08/2019   K 4.2 09/08/2019   CO2 22 12/03/2008   GLUCOSE 99 09/08/2019   BUN 12 09/08/2019   CREATININE 0.85 09/08/2019   BILITOT 0.3 09/08/2019   ALKPHOS 98 09/08/2019   AST 20 09/08/2019   ALT 25 12/01/2008   PROT 7.0 09/08/2019   ALBUMIN 4.0 09/08/2019   CALCIUM 9.6 09/08/2019   Lab Results  Component Value Date   CHOL 178 09/08/2019   Lab Results  Component Value Date   HDL 39 (L) 09/08/2019   Lab Results  Component Value Date   LDLCALC 121 (H) 09/08/2019   Lab Results  Component Value Date   TRIG 98 09/08/2019   Lab Results  Component Value Date   CHOLHDL 4.6 09/08/2019   Lab Results  Component Value Date   HGBA1C 5.8 (H) 09/08/2019      Assessment & Plan:   Problem List Items Addressed This Visit      Respiratory   Aspiration pneumonia of both upper lobes due to regurgitated food ([place]) - Primary    Will treat with Zpack and follow up in one week Will plan to repeat chest xray in a few weeks      Relevant Medications   azithromycin (ZITHROMAX) 250 MG tablet     Endocrine   Thyroid nodule    Will talk about thyroid ultrasound after pt improving from recent history of pneumonia        Other   Other fatigue    labwork pending      Relevant Orders   CBC with Differential/Platelet   Comprehensive metabolic panel   TSH      Meds ordered this encounter  Medications  . azithromycin (ZITHROMAX) 250 MG tablet    Sig: 2 po day one then 1 po days 2-5    Dispense:  6 each    Refill:  0    Order Specific Question:   Supervising Provider    AnswerShelton Silvas    Follow-up: Return in  about 1 week (around 11/08/2019) for follow up - 30 min.    SARA R Trinton Prewitt, PA-C

## 2019-11-01 NOTE — Assessment & Plan Note (Signed)
labwork pending 

## 2019-11-01 NOTE — Assessment & Plan Note (Signed)
Will treat with Zpack and follow up in one week Will plan to repeat chest xray in a few weeks

## 2019-11-02 LAB — CBC WITH DIFFERENTIAL/PLATELET
Basophils Absolute: 0.1 10*3/uL (ref 0.0–0.2)
Basos: 1 %
EOS (ABSOLUTE): 0.5 10*3/uL — ABNORMAL HIGH (ref 0.0–0.4)
Eos: 6 %
Hematocrit: 42.4 % (ref 37.5–51.0)
Hemoglobin: 14 g/dL (ref 13.0–17.7)
Immature Grans (Abs): 0 10*3/uL (ref 0.0–0.1)
Immature Granulocytes: 0 %
Lymphocytes Absolute: 1.6 10*3/uL (ref 0.7–3.1)
Lymphs: 21 %
MCH: 29.2 pg (ref 26.6–33.0)
MCHC: 33 g/dL (ref 31.5–35.7)
MCV: 89 fL (ref 79–97)
Monocytes Absolute: 0.8 10*3/uL (ref 0.1–0.9)
Monocytes: 10 %
Neutrophils Absolute: 4.9 10*3/uL (ref 1.4–7.0)
Neutrophils: 62 %
Platelets: 367 10*3/uL (ref 150–450)
RBC: 4.79 x10E6/uL (ref 4.14–5.80)
RDW: 13 % (ref 11.6–15.4)
WBC: 7.9 10*3/uL (ref 3.4–10.8)

## 2019-11-02 LAB — COMPREHENSIVE METABOLIC PANEL
ALT: 24 IU/L (ref 0–44)
AST: 24 IU/L (ref 0–40)
Albumin/Globulin Ratio: 1.3 (ref 1.2–2.2)
Albumin: 4 g/dL (ref 3.8–4.9)
Alkaline Phosphatase: 112 IU/L (ref 39–117)
BUN/Creatinine Ratio: 19 (ref 9–20)
BUN: 14 mg/dL (ref 6–24)
Bilirubin Total: <0.2 mg/dL (ref 0.0–1.2)
CO2: 22 mmol/L (ref 20–29)
Calcium: 9.2 mg/dL (ref 8.7–10.2)
Chloride: 105 mmol/L (ref 96–106)
Creatinine, Ser: 0.75 mg/dL — ABNORMAL LOW (ref 0.76–1.27)
GFR calc Af Amer: 118 mL/min/{1.73_m2} (ref >59)
GFR calc non Af Amer: 102 mL/min/{1.73_m2} (ref >59)
Globulin, Total: 3 g/dL (ref 1.5–4.5)
Glucose: 103 mg/dL — ABNORMAL HIGH (ref 65–99)
Potassium: 4.7 mmol/L (ref 3.5–5.2)
Sodium: 141 mmol/L (ref 134–144)
Total Protein: 7 g/dL (ref 6.0–8.5)

## 2019-11-02 LAB — TSH: TSH: 1.16 u[IU]/mL (ref 0.450–4.500)

## 2019-11-08 ENCOUNTER — Other Ambulatory Visit: Payer: Self-pay

## 2019-11-08 ENCOUNTER — Encounter: Payer: Self-pay | Admitting: Physician Assistant

## 2019-11-08 ENCOUNTER — Ambulatory Visit (INDEPENDENT_AMBULATORY_CARE_PROVIDER_SITE_OTHER): Payer: Medicare Other | Admitting: Physician Assistant

## 2019-11-08 VITALS — BP 126/80 | HR 103 | Temp 96.6°F | Resp 16 | Wt 201.0 lb

## 2019-11-08 DIAGNOSIS — J690 Pneumonitis due to inhalation of food and vomit: Secondary | ICD-10-CM | POA: Diagnosis not present

## 2019-11-08 MED ORDER — DM-GUAIFENESIN ER 30-600 MG PO TB12: 1.0000 | ORAL_TABLET | Freq: Two times a day (BID) | ORAL | 0 refills | Status: DC

## 2019-11-08 MED ORDER — CLARITHROMYCIN 500 MG PO TABS: 500.0000 mg | ORAL_TABLET | Freq: Two times a day (BID) | ORAL | 0 refills | Status: DC

## 2019-11-08 NOTE — Progress Notes (Signed)
Established Patient Office Visit  Subjective:  Patient ID: [name], male    DOB: Dec 06, 1960  Age: 59 y.o. MRN: [id number]  CC:  Chief Complaint  Patient presents with  . Pneumonia    HPI [name] presents for follow up from pneumonia Pt was admitted to [facility] over 3 weeks ago for aspiration pneumonia - He was hypoxic upon admission - while there he was treated with IV antibiotics and improved He had a CTA of chest which showed multifocal pneumonia Pt was treated with augmentin first by hospital (which he is allergic to and caused GI upset) and then with zpack -- caregiver states he is eating and sleeping better but still with decreased energy Denies fever - still coughing Last week cbc and cmp normal  Past Medical History:  Diagnosis Date  . Essential hypertension   . GERD without esophagitis   . Mixed hyperlipidemia   . Prediabetes   . Seizure disorder ([place])   . Severe intellectual disabilities     History reviewed. No pertinent surgical history.  History reviewed. No pertinent family history.  Social History   Socioeconomic History  . Marital status: Single    Spouse name: Not on file  . Number of children: Not on file  . Years of education: Not on file  . Highest education level: Not on file  Occupational History  . Not on file  Tobacco Use  . Smoking status: Never Smoker  . Smokeless tobacco: Never Used  Substance and Sexual Activity  . Alcohol use: Never  . Drug use: Never  . Sexual activity: Not on file  Other Topics Concern  . Not on file  Social History Narrative  . Not on file   Social Determinants of Health   Financial Resource Strain:   . Difficulty of Paying Living Expenses:   Food Insecurity:   . Worried About Charity fundraiser in the Last Year:   . Arboriculturist in the Last Year:   Transportation Needs:   . Film/video editor (Medical):   Marland Kitchen Lack of Transportation (Non-Medical):   Physical Activity:    . Days of Exercise per Week:   . Minutes of Exercise per Session:   Stress:   . Feeling of Stress :   Social Connections:   . Frequency of Communication with Friends and Family:   . Frequency of Social Gatherings with Friends and Family:   . Attends Religious Services:   . Active Member of Clubs or Organizations:   . Attends Archivist Meetings:   Marland Kitchen Marital Status:   Intimate Partner Violence:   . Fear of Current or Ex-Partner:   . Emotionally Abused:   Marland Kitchen Physically Abused:   . Sexually Abused:      Current Outpatient Medications:  .  acetaminophen (TYLENOL) 500 MG tablet, Take 500 mg by mouth every 6 (six) hours as needed., Disp: , Rfl:  .  alum & mag hydroxide-simeth (MAALOX/MYLANTA) 200-200-20 MG/5ML suspension, Take 15 mLs by mouth every 6 (six) hours as needed for indigestion or heartburn. Take 15 ml by mouth after meals and at bedtime, Disp: , Rfl:  .  amLODipine (NORVASC) 10 MG tablet, Take 10 mg by mouth daily., Disp: , Rfl:  .  atorvastatin (LIPITOR) 10 MG tablet, Take 10 mg by mouth daily., Disp: , Rfl:  .  citalopram (CELEXA) 40 MG tablet, Take 40 mg by mouth daily., Disp: , Rfl:  .  clarithromycin (BIAXIN)  500 MG tablet, Take 1 tablet (500 mg total) by mouth 2 (two) times daily., Disp: 28 tablet, Rfl: 0 .  clonazePAM (KLONOPIN) 0.5 MG tablet, Take 0.5 mg by mouth 3 (three) times daily as needed. Take 1 tablet every morning, 1 at noon, 1/2 at 4 pm and 1 at bedtime, Disp: , Rfl:  .  dextromethorphan-guaiFENesin (MUCINEX DM) 30-600 MG 12hr tablet, Take 1 tablet by mouth 2 (two) times daily., Disp: 28 tablet, Rfl: 0 .  Ensure (ENSURE), Take 237 mLs by mouth 2 (two) times daily between meals., Disp: , Rfl:  .  lactulose (CHRONULAC) 10 GM/15ML solution, GIVE 45 ML (30 GM) BY MOUTH 3 TIMES A DAY, Disp: 1892 mL, Rfl: 10 .  loratadine (CLARITIN) 10 MG tablet, Take 10 mg by mouth daily., Disp: , Rfl:  .  montelukast (SINGULAIR) 10 MG tablet, Take 10 mg by mouth daily.,  Disp: , Rfl:  .  Multiple Vitamin (MULTIVITAMIN) tablet, Take 1 tablet by mouth daily., Disp: , Rfl:  .  pantoprazole (PROTONIX) 40 MG tablet, Take twice daily, Disp: , Rfl:  .  psyllium (REGULOID) 0.52 g capsule, Take 28.3 g by mouth., Disp: , Rfl:  .  QUEtiapine (SEROQUEL) 200 MG tablet, Take 200 mg by mouth. Take 1 every morning, 1 1/2 at noon , 1/2 at 4 pm and 1 at bedtime, Disp: , Rfl:  .  Starch, Thickening, LIQD, 3 tsps per 4 oz of liquid. Recommend 12 oz liquid (water,tea, or juice) three times a day., Disp: 237 mL, Rfl: 11 .  valsartan (DIOVAN) 40 MG tablet, Take 40 mg by mouth daily. Take 1 daily, Disp: , Rfl:    Allergies  Allergen Reactions  . Augmentin [Amoxicillin-Pot Clavulanate]     ROS CONSTITUTIONAL: Negative for chills,, fever, unintentional weight gain and unintentional weight loss.- mild fatigue E/N/T: Negative for ear pain, nasal congestion and sore throat.  CARDIOVASCULAR: Negative for chest pain, dizziness, palpitations and pedal edema.  RESPIRATORY: see HPI GASTROINTESTINAL: negative for diarrhea or constipation         Objective:    PHYSICAL EXAM:   VS: BP 126/80   Pulse (!) 103   Temp (!) 96.6 F (35.9 C)   Resp 16   Wt 201 lb (91.2 kg)   SpO2 94%   BMI 29.68 kg/m   GEN: Well nourished, well developed, in no acute distress --more calm than usual for patient but improved since last visit  Oropharynx - normal mucosa, palate, and posterior pharynx  Cardiac: RRR; no murmurs, rubs, or gallops,no edema - no significant varicosities Respiratory:  normal respiratory rate and pattern with no distress -faint scattered rhonchi noted   Skin: warm and dry, no rash  Neuro:  Alert  Psych: euthymic mood - seems tired  BP 126/80   Pulse (!) 103   Temp (!) 96.6 F (35.9 C)   Resp 16   Wt 201 lb (91.2 kg)   SpO2 94%   BMI 29.68 kg/m  Wt Readings from Last 3 Encounters:  11/08/19 201 lb (91.2 kg)  11/01/19 202 lb (91.6 kg)  09/08/19 208 lb 9.6 oz  (94.6 kg)     Health Maintenance Due  Topic Date Due  . Hepatitis C Screening  Never done  . HIV Screening  Never done  . TETANUS/TDAP  Never done  . COLONOSCOPY  Never done    There are no preventive care reminders to display for this patient.   Lab Results  Component Value Date  WBC 7.9 11/01/2019   HGB 14.0 11/01/2019   HCT 42.4 11/01/2019   MCV 89 11/01/2019   PLT 367 11/01/2019   Lab Results  Component Value Date   NA 141 11/01/2019   K 4.7 11/01/2019   CO2 22 11/01/2019   GLUCOSE 103 (H) 11/01/2019   BUN 14 11/01/2019   CREATININE 0.75 (L) 11/01/2019   BILITOT <0.2 11/01/2019   ALKPHOS 112 11/01/2019   AST 24 11/01/2019   ALT 24 11/01/2019   PROT 7.0 11/01/2019   ALBUMIN 4.0 11/01/2019   CALCIUM 9.2 11/01/2019   Lab Results  Component Value Date   CHOL 178 09/08/2019   Lab Results  Component Value Date   HDL 39 (L) 09/08/2019   Lab Results  Component Value Date   LDLCALC 121 (H) 09/08/2019   Lab Results  Component Value Date   TRIG 98 09/08/2019   Lab Results  Component Value Date   CHOLHDL 4.6 09/08/2019   Lab Results  Component Value Date   HGBA1C 5.8 (H) 09/08/2019      Assessment & Plan:   Problem List Items Addressed This Visit      Respiratory   Aspiration pneumonia of both upper lobes due to regurgitated food ([place]) - Primary    rx for mucinex dm and biaxin for 2 weeks Will plan for follow up visit in 2 weeks  Follow up sooner if any symptoms change or worsen      Relevant Medications   clarithromycin (BIAXIN) 500 MG tablet   dextromethorphan-guaiFENesin (MUCINEX DM) 30-600 MG 12hr tablet      Meds ordered this encounter  Medications  . clarithromycin (BIAXIN) 500 MG tablet    Sig: Take 1 tablet (500 mg total) by mouth 2 (two) times daily.    Dispense:  28 tablet    Refill:  0    Order Specific Question:   Supervising Provider    AnswerRochel Brome [id number]  . dextromethorphan-guaiFENesin (MUCINEX DM) 30-600 MG  12hr tablet    Sig: Take 1 tablet by mouth 2 (two) times daily.    Dispense:  28 tablet    Refill:  0    Order Specific Question:   Supervising Provider    AnswerShelton Silvas    Follow-up: Return in about 2 weeks (around 11/22/2019).    SARA R Alla Sloma, PA-C

## 2019-11-08 NOTE — Assessment & Plan Note (Signed)
rx for mucinex dm and biaxin for 2 weeks Will plan for follow up visit in 2 weeks  Follow up sooner if any symptoms change or worsen

## 2019-11-09 ENCOUNTER — Other Ambulatory Visit: Payer: Self-pay | Admitting: Physician Assistant

## 2019-11-09 DIAGNOSIS — I10 Essential (primary) hypertension: Secondary | ICD-10-CM

## 2019-11-09 DIAGNOSIS — E782 Mixed hyperlipidemia: Secondary | ICD-10-CM

## 2019-11-11 ENCOUNTER — Telehealth: Payer: Self-pay | Admitting: Family Medicine

## 2019-11-11 NOTE — Progress Notes (Signed)
°  Chronic Care Management   Outreach Note  11/11/2019 Name: [name] MRN: [id number] DOB: Dec 05, 1960  Referred by: Rochel Brome, MD Reason for referral : No chief complaint on file.   An unsuccessful telephone outreach was attempted today. The patient was referred to the pharmacist for assistance with care management and care coordination.   Follow Up Plan:   Earney [place] [facility] Scheduler

## 2019-11-22 ENCOUNTER — Telehealth: Payer: Self-pay | Admitting: Family Medicine

## 2019-11-22 NOTE — Progress Notes (Signed)
°  Chronic Care Management   Outreach Note  11/22/2019 Name: [name] MRN: [id number] DOB: 1961-05-02  Referred by: Rochel Brome, MD Reason for referral : No chief complaint on file.   An unsuccessful telephone outreach was attempted today. The patient was referred to the pharmacist for assistance with care management and care coordination.   This note is not being shared with the patient for the following reason: To respect privacy (The patient or proxy has requested that the information not be shared).  Follow Up Plan:   Earney Hamburg Upstream Scheduler

## 2019-11-24 ENCOUNTER — Ambulatory Visit (INDEPENDENT_AMBULATORY_CARE_PROVIDER_SITE_OTHER): Payer: Medicare Other | Admitting: Physician Assistant

## 2019-11-24 ENCOUNTER — Encounter: Payer: Self-pay | Admitting: Physician Assistant

## 2019-11-24 ENCOUNTER — Other Ambulatory Visit: Payer: Self-pay

## 2019-11-24 VITALS — BP 118/64 | HR 101 | Temp 97.3°F | Resp 16 | Wt 201.0 lb

## 2019-11-24 DIAGNOSIS — R195 Other fecal abnormalities: Secondary | ICD-10-CM | POA: Diagnosis not present

## 2019-11-24 DIAGNOSIS — J690 Pneumonitis due to inhalation of food and vomit: Secondary | ICD-10-CM

## 2019-11-24 MED ORDER — DM-GUAIFENESIN ER 30-600 MG PO TB12: 1.0000 | ORAL_TABLET | Freq: Two times a day (BID) | ORAL | 0 refills | Status: DC

## 2019-11-24 NOTE — Progress Notes (Signed)
Established Patient Office Visit  Subjective:  Patient ID: [name], male    DOB: 20-Jul-1961  Age: 59 y.o. MRN: [id number]  CC:  Chief Complaint  Patient presents with  . Pneumonia    HPI [name] presents for follow up from pneumonia Pt was admitted to [facility] over a month ago for aspiration pneumonia - He was hypoxic upon admission - while there he was treated with IV antibiotics and improved He had a CTA of chest which showed multifocal pneumonia Pt was treated with augmentin first by hospital (which he is allergic to and caused GI upset) and then with zpack for 2 doses Caregiver states overall feeling better Denies fever - still coughing but occasionally ---- does have his energy back and eating/drinking well  Caregiver mentions that even since being in hospital he has had intermittent pink tint at times to his stool  - she states they were told at hospital could be due to his antibiotics they gave him I have seen pt personally twice since then and this is first time it has been mentioned to me  Past Medical History:  Diagnosis Date  . Essential hypertension   . GERD without esophagitis   . Mixed hyperlipidemia   . Prediabetes   . Seizure disorder ([place])   . Severe intellectual disabilities     History reviewed. No pertinent surgical history.  History reviewed. No pertinent family history.  Social History   Socioeconomic History  . Marital status: Single    Spouse name: Not on file  . Number of children: Not on file  . Years of education: Not on file  . Highest education level: Not on file  Occupational History  . Not on file  Tobacco Use  . Smoking status: Never Smoker  . Smokeless tobacco: Never Used  Substance and Sexual Activity  . Alcohol use: Never  . Drug use: Never  . Sexual activity: Not on file  Other Topics Concern  . Not on file  Social History Narrative  . Not on file   Social Determinants of Health   Financial  Resource Strain:   . Difficulty of Paying Living Expenses:   Food Insecurity:   . Worried About Charity fundraiser in the Last Year:   . Arboriculturist in the Last Year:   Transportation Needs:   . Film/video editor (Medical):   Marland Kitchen Lack of Transportation (Non-Medical):   Physical Activity:   . Days of Exercise per Week:   . Minutes of Exercise per Session:   Stress:   . Feeling of Stress :   Social Connections:   . Frequency of Communication with Friends and Family:   . Frequency of Social Gatherings with Friends and Family:   . Attends Religious Services:   . Active Member of Clubs or Organizations:   . Attends Archivist Meetings:   Marland Kitchen Marital Status:   Intimate Partner Violence:   . Fear of Current or Ex-Partner:   . Emotionally Abused:   Marland Kitchen Physically Abused:   . Sexually Abused:      Current Outpatient Medications:  .  acetaminophen (TYLENOL) 500 MG tablet, Take 500 mg by mouth every 6 (six) hours as needed., Disp: , Rfl:  .  alum & mag hydroxide-simeth (MAALOX/MYLANTA) 200-200-20 MG/5ML suspension, Take 15 mLs by mouth every 6 (six) hours as needed for indigestion or heartburn. Take 15 ml by mouth after meals and at bedtime, Disp: , Rfl:  .  amLODipine (NORVASC) 10 MG tablet, Take 10 mg by mouth daily., Disp: , Rfl:  .  atorvastatin (LIPITOR) 10 MG tablet, Take 10 mg by mouth daily., Disp: , Rfl:  .  citalopram (CELEXA) 40 MG tablet, Take 40 mg by mouth daily., Disp: , Rfl:  .  clarithromycin (BIAXIN) 500 MG tablet, Take 1 tablet (500 mg total) by mouth 2 (two) times daily., Disp: 28 tablet, Rfl: 0 .  clonazePAM (KLONOPIN) 0.5 MG tablet, Take 0.5 mg by mouth 3 (three) times daily as needed. Take 1 tablet every morning, 1 at noon, 1/2 at 4 pm and 1 at bedtime, Disp: , Rfl:  .  dextromethorphan-guaiFENesin (MUCINEX DM) 30-600 MG 12hr tablet, Take 1 tablet by mouth 2 (two) times daily., Disp: 28 tablet, Rfl: 0 .  Ensure (ENSURE), Take 237 mLs by mouth 2 (two)  times daily between meals., Disp: , Rfl:  .  lactulose (CHRONULAC) 10 GM/15ML solution, GIVE 45 ML (30 GM) BY MOUTH 3 TIMES A DAY, Disp: 1892 mL, Rfl: 10 .  loratadine (CLARITIN) 10 MG tablet, Take 10 mg by mouth daily., Disp: , Rfl:  .  montelukast (SINGULAIR) 10 MG tablet, Take 10 mg by mouth daily., Disp: , Rfl:  .  Multiple Vitamin (MULTIVITAMIN) tablet, Take 1 tablet by mouth daily., Disp: , Rfl:  .  pantoprazole (PROTONIX) 40 MG tablet, Take twice daily, Disp: , Rfl:  .  psyllium (REGULOID) 0.52 g capsule, Take 28.3 g by mouth., Disp: , Rfl:  .  QUEtiapine (SEROQUEL) 200 MG tablet, Take 200 mg by mouth. Take 1 every morning, 1 1/2 at noon , 1/2 at 4 pm and 1 at bedtime, Disp: , Rfl:  .  Starch, Thickening, LIQD, 3 tsps per 4 oz of liquid. Recommend 12 oz liquid (water,tea, or juice) three times a day., Disp: 237 mL, Rfl: 11 .  valsartan (DIOVAN) 40 MG tablet, Take 40 mg by mouth daily. Take 1 daily, Disp: , Rfl:    Allergies  Allergen Reactions  . Augmentin [Amoxicillin-Pot Clavulanate]     ROS CONSTITUTIONAL: Negative for chills,, fever, unintentional weight gain and unintentional weight loss.- E/N/T: Negative for ear pain, nasal congestion and sore throat.  CARDIOVASCULAR: Negative for chest pain, dizziness, palpitations and pedal edema.  RESPIRATORY: see HPI GASTROINTESTINAL: see HPI Skin - normal         Objective:    PHYSICAL EXAM:   VS: BP 118/64   Pulse (!) 101   Temp (!) 97.3 F (36.3 C)   Resp 16   Wt 201 lb (91.2 kg)   SpO2 95%   BMI 29.68 kg/m   GEN: Well nourished, well developed, in no acute distress --more calm than usual for patient but improved since last visit  Cardiac: RRR; no murmurs, rubs, or gallops,no edema - no significant varicosities Respiratory:  normal respiratory rate and pattern with no distress -faint scattered rhonchi noted   Skin: warm and dry, no rash   Psych: euthymic mood - seems more himself today   BP 118/64   Pulse (!)  101   Temp (!) 97.3 F (36.3 C)   Resp 16   Wt 201 lb (91.2 kg)   SpO2 95%   BMI 29.68 kg/m  Wt Readings from Last 3 Encounters:  11/24/19 201 lb (91.2 kg)  11/08/19 201 lb (91.2 kg)  11/01/19 202 lb (91.6 kg)     Health Maintenance Due  Topic Date Due  . Hepatitis C Screening  Never done  .  HIV Screening  Never done  . TETANUS/TDAP  Never done  . COLONOSCOPY  Never done    There are no preventive care reminders to display for this patient.   Lab Results  Component Value Date   WBC 7.9 11/01/2019   HGB 14.0 11/01/2019   HCT 42.4 11/01/2019   MCV 89 11/01/2019   PLT 367 11/01/2019   Lab Results  Component Value Date   NA 141 11/01/2019   K 4.7 11/01/2019   CO2 22 11/01/2019   GLUCOSE 103 (H) 11/01/2019   BUN 14 11/01/2019   CREATININE 0.75 (L) 11/01/2019   BILITOT <0.2 11/01/2019   ALKPHOS 112 11/01/2019   AST 24 11/01/2019   ALT 24 11/01/2019   PROT 7.0 11/01/2019   ALBUMIN 4.0 11/01/2019   CALCIUM 9.2 11/01/2019   Lab Results  Component Value Date   CHOL 178 09/08/2019   Lab Results  Component Value Date   HDL 39 (L) 09/08/2019   Lab Results  Component Value Date   LDLCALC 121 (H) 09/08/2019   Lab Results  Component Value Date   TRIG 98 09/08/2019   Lab Results  Component Value Date   CHOLHDL 4.6 09/08/2019   Lab Results  Component Value Date   HGBA1C 5.8 (H) 09/08/2019      Assessment & Plan:   Problem List Items Addressed This Visit      Respiratory   Aspiration pneumonia of both upper lobes due to regurgitated food ([place]) - Primary    Continue mucinex as directed bid Order for repeat chest xray given      Relevant Medications   dextromethorphan-guaiFENesin (MUCINEX DM) 30-600 MG 12hr tablet   Other Relevant Orders   DG Chest 2 View     Other   Change in stool    Stool studies ordered Hemoccult cards given If all normal will refer to GI for further evaluation      Relevant Orders   Cdiff NAA+O+P+Stool Culture       Meds ordered this encounter  Medications  . dextromethorphan-guaiFENesin (MUCINEX DM) 30-600 MG 12hr tablet    Sig: Take 1 tablet by mouth 2 (two) times daily.    Dispense:  28 tablet    Refill:  0    Order Specific Question:   Supervising Provider    AnswerShelton Silvas    Follow-up: Return for has appt on May 13 for chronic follow up.    SARA R Okey Zelek, PA-C

## 2019-11-24 NOTE — Assessment & Plan Note (Signed)
Stool studies ordered Hemoccult cards given If all normal will refer to GI for further evaluation

## 2019-11-24 NOTE — Assessment & Plan Note (Signed)
Continue mucinex as directed bid Order for repeat chest xray given

## 2019-11-28 DIAGNOSIS — J690 Pneumonitis due to inhalation of food and vomit: Secondary | ICD-10-CM | POA: Diagnosis not present

## 2019-11-28 DIAGNOSIS — J189 Pneumonia, unspecified organism: Secondary | ICD-10-CM | POA: Diagnosis not present

## 2019-12-06 ENCOUNTER — Other Ambulatory Visit: Payer: Self-pay

## 2019-12-06 DIAGNOSIS — E782 Mixed hyperlipidemia: Secondary | ICD-10-CM

## 2019-12-06 DIAGNOSIS — I10 Essential (primary) hypertension: Secondary | ICD-10-CM

## 2019-12-06 NOTE — Progress Notes (Signed)
Subjective:  Patient ID: GAMALIER OSTHOFF, male    DOB: September 17, 1960  Age: 59 y.o. MRN: [id number]  Chief Complaint  Patient presents with  . Hypertension  . Hyperlipidemia  . Prediabetes   HPI  [name] presents for follow up of aspiration pneumonia. Repeat cxr per Adron Bene, [facility], who reviewed it was normal except for mild atelectasis BL lower lobes. Cough has improved. Appetite has greatly improved. Drinking lots. Denies fever. Labs for prediabetes and cholesterol were done in mid February. Recommend repeat in 3 months rather than today.    Past Medical History:  Diagnosis Date  . Essential hypertension   . GERD without esophagitis   . Mixed hyperlipidemia   . Prediabetes   . Seizure disorder ([place])   . Severe intellectual disabilities    History reviewed. No pertinent surgical history.  History reviewed. No pertinent family history. Social History   Socioeconomic History  . Marital status: Single    Spouse name: Not on file  . Number of children: Not on file  . Years of education: Not on file  . Highest education level: Not on file  Occupational History  . Not on file  Tobacco Use  . Smoking status: Never Smoker  . Smokeless tobacco: Never Used  Substance and Sexual Activity  . Alcohol use: Never  . Drug use: Never  . Sexual activity: Not on file  Other Topics Concern  . Not on file  Social History Narrative  . Not on file   Social Determinants of Health   Financial Resource Strain:   . Difficulty of Paying Living Expenses:   Food Insecurity:   . Worried About Charity fundraiser in the Last Year:   . Arboriculturist in the Last Year:   Transportation Needs:   . Film/video editor (Medical):   Marland Kitchen Lack of Transportation (Non-Medical):   Physical Activity:   . Days of Exercise per Week:   . Minutes of Exercise per Session:   Stress:   . Feeling of Stress :   Social Connections:   . Frequency of Communication with Friends and Family:   . Frequency of  Social Gatherings with Friends and Family:   . Attends Religious Services:   . Active Member of Clubs or Organizations:   . Attends Archivist Meetings:   Marland Kitchen Marital Status:     Review of Systems  Constitutional: Negative for chills, diaphoresis, fatigue and fever.  HENT: Negative for congestion, ear pain and sore throat.   Respiratory: Negative for cough and shortness of breath.   Cardiovascular: Negative for chest pain and leg swelling.  Gastrointestinal: Negative for abdominal pain, constipation, diarrhea, nausea and vomiting.  Genitourinary: Negative for dysuria and urgency.  Musculoskeletal: Negative for arthralgias and myalgias.  Neurological: Negative for dizziness and headaches.  Psychiatric/Behavioral: Negative for dysphoric mood.     Objective:  BP 124/78   Pulse 76   Temp (!) 97 F (36.1 C)   Resp 18   Ht 5\' 9"  (1.753 m)   Wt 207 lb 6.4 oz (94.1 kg)   BMI 30.63 kg/m   BP/Weight 12/08/2019 11/24/2019 [id number]  Systolic BP [id number]  Diastolic BP 78 64 80  Wt. (Lbs) 207.4 201 201  BMI 30.63 29.68 29.68    Physical Exam Vitals reviewed.  Constitutional:      Appearance: Normal appearance. He is normal weight.  Cardiovascular:     Rate and Rhythm: Normal rate and regular rhythm.  Pulmonary:     Effort: Pulmonary effort is normal.     Breath sounds: Normal breath sounds.  Abdominal:     General: Abdomen is flat. Bowel sounds are normal.     Palpations: Abdomen is soft.  Neurological:     Mental Status: He is alert and oriented to person, place, and time.  Psychiatric:        Mood and Affect: Mood normal.        Behavior: Behavior normal.     Diabetic Foot Exam - Simple   No data filed       Lab Results  Component Value Date   WBC 7.9 11/01/2019   HGB 14.0 11/01/2019   HCT 42.4 11/01/2019   PLT 367 11/01/2019   GLUCOSE 103 (H) 11/01/2019   CHOL 178 09/08/2019   TRIG 98 09/08/2019   HDL 39 (L) 09/08/2019   LDLCALC 121 (H)  09/08/2019   ALT 24 11/01/2019   AST 24 11/01/2019   NA 141 11/01/2019   K 4.7 11/01/2019   CL 105 11/01/2019   CREATININE 0.75 (L) 11/01/2019   BUN 14 11/01/2019   CO2 22 11/01/2019   TSH 1.160 11/01/2019   INR 1.1 12/01/2008   HGBA1C 5.8 (H) 09/08/2019      Assessment & Plan:  1. Aspiration pneumonia of both upper lobes due to regurgitated food ([place]) Resolved. Patient is feeling much better.  2. Mentally disabled Under adult care home. Nonverbal.  3. Essential hypertension The current medical regimen is effective;  continue present plan and medications.  4. Mixed hyperlipidemia Well controlled.  Recheck in 3 months.   5. Prediabetes Well controlled.  Recheck in 3 months.  Follow-up: Return in about 3 months (around 03/09/2020).  An After Visit Summary was printed and given to the patient.  Rochel Brome [facility] [phone]

## 2019-12-07 MED ORDER — VALSARTAN 40 MG PO TABS: 40.0000 mg | ORAL_TABLET | Freq: Every day | ORAL | 2 refills | Status: DC

## 2019-12-07 MED ORDER — ATORVASTATIN CALCIUM 10 MG PO TABS: 10.0000 mg | ORAL_TABLET | Freq: Every day | ORAL | 2 refills | Status: DC

## 2019-12-08 ENCOUNTER — Other Ambulatory Visit: Payer: Self-pay

## 2019-12-08 ENCOUNTER — Encounter: Payer: Self-pay | Admitting: Family Medicine

## 2019-12-08 ENCOUNTER — Ambulatory Visit (INDEPENDENT_AMBULATORY_CARE_PROVIDER_SITE_OTHER): Payer: Medicare Other | Admitting: Family Medicine

## 2019-12-08 VITALS — BP 124/78 | HR 76 | Temp 97.0°F | Resp 18 | Ht 69.0 in | Wt 207.4 lb

## 2019-12-08 DIAGNOSIS — J690 Pneumonitis due to inhalation of food and vomit: Secondary | ICD-10-CM

## 2019-12-08 DIAGNOSIS — I10 Essential (primary) hypertension: Secondary | ICD-10-CM | POA: Diagnosis not present

## 2019-12-08 DIAGNOSIS — R7303 Prediabetes: Secondary | ICD-10-CM | POA: Diagnosis not present

## 2019-12-08 DIAGNOSIS — E782 Mixed hyperlipidemia: Secondary | ICD-10-CM

## 2019-12-08 DIAGNOSIS — F79 Unspecified intellectual disabilities: Secondary | ICD-10-CM | POA: Diagnosis not present

## 2019-12-08 DIAGNOSIS — R195 Other fecal abnormalities: Secondary | ICD-10-CM | POA: Diagnosis not present

## 2019-12-11 ENCOUNTER — Encounter: Payer: Self-pay | Admitting: Family Medicine

## 2019-12-12 LAB — CDIFF NAA+O+P+STOOL CULTURE: E coli, Shiga toxin Assay: NEGATIVE

## 2020-01-04 ENCOUNTER — Telehealth: Payer: Self-pay | Admitting: Family Medicine

## 2020-01-04 NOTE — Progress Notes (Signed)
°  Chronic Care Management   Outreach Note  01/04/2020 Name: [name] MRN: [id number] DOB: 1960/08/25  Referred by: Rochel Brome, MD Reason for referral : No chief complaint on file.   An unsuccessful telephone outreach was attempted today. The patient was referred to the pharmacist for assistance with care management and care coordination.   This note is not being shared with the patient for the following reason: To respect privacy (The patient or proxy has requested that the information not be shared).  Follow Up Plan:   Earney [place] [facility] Scheduler

## 2020-01-18 ENCOUNTER — Other Ambulatory Visit: Payer: Self-pay

## 2020-01-18 DIAGNOSIS — F79 Unspecified intellectual disabilities: Secondary | ICD-10-CM

## 2020-01-18 DIAGNOSIS — I10 Essential (primary) hypertension: Secondary | ICD-10-CM

## 2020-01-18 DIAGNOSIS — K219 Gastro-esophageal reflux disease without esophagitis: Secondary | ICD-10-CM

## 2020-01-18 MED ORDER — PANTOPRAZOLE SODIUM 40 MG PO TBEC: 40.0000 mg | DELAYED_RELEASE_TABLET | Freq: Two times a day (BID) | ORAL | 0 refills | Status: DC

## 2020-01-18 MED ORDER — ONE-DAILY MULTI VITAMINS PO TABS: 1.0000 | ORAL_TABLET | Freq: Every day | ORAL | 0 refills | Status: DC

## 2020-01-18 MED ORDER — AMLODIPINE BESYLATE 10 MG PO TABS: 10.0000 mg | ORAL_TABLET | Freq: Every day | ORAL | 0 refills | Status: DC

## 2020-01-26 ENCOUNTER — Other Ambulatory Visit: Payer: Self-pay | Admitting: Physician Assistant

## 2020-01-26 DIAGNOSIS — F79 Unspecified intellectual disabilities: Secondary | ICD-10-CM

## 2020-01-26 MED ORDER — MONTELUKAST SODIUM 10 MG PO TABS: 10.0000 mg | ORAL_TABLET | Freq: Every day | ORAL | 1 refills | Status: DC

## 2020-01-31 DIAGNOSIS — F419 Anxiety disorder, unspecified: Secondary | ICD-10-CM | POA: Diagnosis not present

## 2020-03-07 ENCOUNTER — Other Ambulatory Visit: Payer: Self-pay

## 2020-03-07 DIAGNOSIS — E782 Mixed hyperlipidemia: Secondary | ICD-10-CM

## 2020-03-07 DIAGNOSIS — I10 Essential (primary) hypertension: Secondary | ICD-10-CM

## 2020-03-07 MED ORDER — ATORVASTATIN CALCIUM 10 MG PO TABS: 10.0000 mg | ORAL_TABLET | Freq: Every day | ORAL | 2 refills | Status: DC

## 2020-03-07 MED ORDER — VALSARTAN 40 MG PO TABS: 40.0000 mg | ORAL_TABLET | Freq: Every day | ORAL | 2 refills | Status: DC

## 2020-03-12 ENCOUNTER — Ambulatory Visit: Payer: Medicare Other | Admitting: Family Medicine

## 2020-03-26 ENCOUNTER — Encounter: Payer: Self-pay | Admitting: Family Medicine

## 2020-03-26 ENCOUNTER — Ambulatory Visit (INDEPENDENT_AMBULATORY_CARE_PROVIDER_SITE_OTHER): Payer: Medicare Other | Admitting: Family Medicine

## 2020-03-26 ENCOUNTER — Other Ambulatory Visit: Payer: Self-pay

## 2020-03-26 VITALS — BP 104/68 | HR 104 | Temp 97.5°F | Ht 69.0 in | Wt 206.0 lb

## 2020-03-26 DIAGNOSIS — Z0001 Encounter for general adult medical examination with abnormal findings: Secondary | ICD-10-CM

## 2020-03-26 DIAGNOSIS — F79 Unspecified intellectual disabilities: Secondary | ICD-10-CM

## 2020-03-26 DIAGNOSIS — Z111 Encounter for screening for respiratory tuberculosis: Secondary | ICD-10-CM

## 2020-03-26 DIAGNOSIS — Z23 Encounter for immunization: Secondary | ICD-10-CM

## 2020-03-26 LAB — TB SKIN TEST
Induration: 0 mm
TB Skin Test: NEGATIVE

## 2020-03-26 NOTE — Progress Notes (Signed)
Subjective:  Patient ID: [name], male    DOB: [birth year]  Age: 59 y.o. MRN: [id number]  Chief Complaint  Patient presents with  . Annual Exam    HPI Encounter for general adult medical examination with abnormal findings.  Patient is a 59 year old mentally impaired adult who lives in a group home.  He requires assistance with dressing, bathing, using the toilet.  He is unable to cook for himself. He does go to the dentist regularly.  He has not been to the eye doctor.  He does not exhibit signs of poor vision in terms of running into things.  Patient is nonverbal.  He does have certain behaviors that indicate when he is in pain which his caretakers are able to identify.  Patient has no trouble sleeping.  In the group home they have functional smoke detectors and carbon monoxide detectors (if gas is present.)  He has a Armed forces technical officer.  Physical ("At Risk" items are starred): Patient's last physical exam was 1 year ago .  Growth percentile SmartLinks can only be used for patients less than 84 years old.   SDOH Screenings   Alcohol Screen:   . Last Alcohol Screening Score (AUDIT): Not on file  Depression (PHQ2-9): Low Risk   . PHQ-2 Score: 0  Financial Resource Strain:   . Difficulty of Paying Living Expenses: Not on file  Food Insecurity:   . Worried About Charity fundraiser in the Last Year: Not on file  . Ran Out of Food in the Last Year: Not on file  Housing:   . Last Housing Risk Score: Not on file  Physical Activity:   . Days of Exercise per Week: Not on file  . Minutes of Exercise per Session: Not on file  Social Connections:   . Frequency of Communication with Friends and Family: Not on file  . Frequency of Social Gatherings with Friends and Family: Not on file  . Attends Religious Services: Not on file  . Active Member of Clubs or Organizations: Not on file  . Attends Archivist Meetings: Not on file  . Marital Status: Not on file  Stress:   .  Feeling of Stress : Not on file  Tobacco Use: Low Risk   . Smoking Tobacco Use: Never Smoker  . Smokeless Tobacco Use: Never Used  Transportation Needs:   . Film/video editor (Medical): Not on file  . Lack of Transportation (Non-Medical): Not on file    Fall Risk  03/26/2020  Falls in the past year? 0  Number falls in past yr: 0  Injury with Fall? 0    Depression screen PHQ 2/9 03/26/2020  Decreased Interest 0  Down, Depressed, Hopeless 0  PHQ - 2 Score 0    Is the patient deaf or have difficulty hearing?: No Does the patient have difficulty seeing, even when wearing glasses/contacts?: No Does the patient have difficulty concentrating, remembering, or making decisions?: Yes Does the patient have difficulty walking or climbing stairs?: No Does the patient have difficulty dressing or bathing?: Yes Does the patient have difficulty doing errands alone such as visiting a doctor's office or shopping?: Yes Functional Status Survey: Is the patient deaf or have difficulty hearing?: No Does the patient have difficulty seeing, even when wearing glasses/contacts?: No Does the patient have difficulty concentrating, remembering, or making decisions?: Yes Does the patient have difficulty walking or climbing stairs?: No Does the patient have difficulty dressing or bathing?: Yes Does the patient  have difficulty doing errands alone such as visiting a doctor's office or shopping?: Yes Current Outpatient Medications on File Prior to Visit  Medication Sig Dispense Refill  . acetaminophen (TYLENOL) 500 MG tablet Take 500 mg by mouth every 6 (six) hours as needed.    Marland Kitchen alum & mag hydroxide-simeth (MAALOX/MYLANTA) 200-200-20 MG/5ML suspension Take 15 mLs by mouth every 6 (six) hours as needed for indigestion or heartburn. Take 15 ml by mouth after meals and at bedtime    . amLODipine (NORVASC) 10 MG tablet Take 1 tablet (10 mg total) by mouth daily. 90 tablet 0  . atorvastatin (LIPITOR) 10 MG tablet  Take 1 tablet (10 mg total) by mouth daily. 30 tablet 2  . citalopram (CELEXA) 40 MG tablet Take 40 mg by mouth daily.    . clonazePAM (KLONOPIN) 0.5 MG tablet Take 0.5 mg by mouth 3 (three) times daily as needed. Take 1 tablet every morning, 1 at noon, 1/2 at 4 pm and 1 at bedtime    . dextromethorphan-guaiFENesin (MUCINEX DM) 30-600 MG 12hr tablet Take 1 tablet by mouth 2 (two) times daily. 28 tablet 0  . Ensure (ENSURE) Take 237 mLs by mouth 2 (two) times daily between meals.    . lactulose (CHRONULAC) 10 GM/15ML solution GIVE 45 ML (30 GM) BY MOUTH 3 TIMES A DAY 1892 mL 10  . loratadine (CLARITIN) 10 MG tablet Take 10 mg by mouth daily.    . montelukast (SINGULAIR) 10 MG tablet Take 1 tablet (10 mg total) by mouth daily. 90 tablet 1  . Multiple Vitamin (MULTIVITAMIN) tablet Take 1 tablet by mouth daily. 90 tablet 0  . pantoprazole (PROTONIX) 40 MG tablet Take 1 tablet (40 mg total) by mouth 2 (two) times daily. Take twice daily 180 tablet 0  . psyllium (REGULOID) 0.52 g capsule Take 28.3 g by mouth.    . QUEtiapine (SEROQUEL) 200 MG tablet Take 200 mg by mouth. Take 1 every morning, 1 1/2 at noon , 1/2 at 4 pm and 1 at bedtime    . Starch, Thickening, LIQD 3 tsps per 4 oz of liquid. Recommend 12 oz liquid (water,tea, or juice) three times a day. 237 mL 11  . valsartan (DIOVAN) 40 MG tablet Take 1 tablet (40 mg total) by mouth daily. Take 1 daily 30 tablet 2   No current facility-administered medications on file prior to visit.    Social Hx   Social History   Socioeconomic History  . Marital status: Single    Spouse name: Not on file  . Number of children: Not on file  . Years of education: Not on file  . Highest education level: Not on file  Occupational History  . Not on file  Tobacco Use  . Smoking status: Never Smoker  . Smokeless tobacco: Never Used  Substance and Sexual Activity  . Alcohol use: Never  . Drug use: Never  . Sexual activity: Not on file  Other Topics Concern    . Not on file  Social History Narrative  . Not on file   Social Determinants of Health   Financial Resource Strain:   . Difficulty of Paying Living Expenses: Not on file  Food Insecurity:   . Worried About Charity fundraiser in the Last Year: Not on file  . Ran Out of Food in the Last Year: Not on file  Transportation Needs:   . Lack of Transportation (Medical): Not on file  . Lack of Transportation (Non-Medical): Not  on file  Physical Activity:   . Days of Exercise per Week: Not on file  . Minutes of Exercise per Session: Not on file  Stress:   . Feeling of Stress : Not on file  Social Connections:   . Frequency of Communication with Friends and Family: Not on file  . Frequency of Social Gatherings with Friends and Family: Not on file  . Attends Religious Services: Not on file  . Active Member of Clubs or Organizations: Not on file  . Attends Archivist Meetings: Not on file  . Marital Status: Not on file   Past Medical History:  Diagnosis Date  . Essential hypertension   . GERD without esophagitis   . Mixed hyperlipidemia   . Prediabetes   . Seizure disorder ([place])   . Severe intellectual disabilities    History reviewed. No pertinent family history.  Review of Systems  Constitutional: Negative for chills, diaphoresis, fatigue and fever.  HENT: Negative for congestion, ear pain and sore throat.   Respiratory: Negative for cough and shortness of breath.   Cardiovascular: Negative for chest pain and leg swelling.  Gastrointestinal: Negative for abdominal pain, constipation, diarrhea, nausea and vomiting.  Genitourinary: Negative for dysuria and urgency.  Musculoskeletal: Negative for arthralgias and myalgias.  Neurological: Negative for dizziness and headaches.  Psychiatric/Behavioral: Negative for dysphoric mood.     Objective:  BP 104/68   Pulse (!) 104   Temp (!) 97.5 F (36.4 C)   Ht 5\' 9"  (1.753 m)   Wt 206 lb (93.4 kg)   SpO2 99%   BMI  30.42 kg/m   BP/Weight 03/26/2020 12/08/2019 12/13/8414  Systolic BP 606 301 601  Diastolic BP 68 78 64  Wt. (Lbs) 206 207.4 201  BMI 30.42 30.63 29.68    Physical Exam Vitals reviewed.  Constitutional:      General: He is not in acute distress.    Appearance: Normal appearance. He is normal weight. He is not ill-appearing.  HENT:     Right Ear: Tympanic membrane normal.     Left Ear: Tympanic membrane normal.     Nose: Nose normal.     Mouth/Throat:     Mouth: Mucous membranes are moist.  Eyes:     Conjunctiva/sclera: Conjunctivae normal.  Neck:     Vascular: No carotid bruit.  Cardiovascular:     Rate and Rhythm: Normal rate and regular rhythm.     Heart sounds: Normal heart sounds.  Pulmonary:     Effort: Pulmonary effort is normal.     Breath sounds: Normal breath sounds.  Abdominal:     General: Bowel sounds are normal.     Palpations: Abdomen is soft.     Tenderness: There is no abdominal tenderness.     Hernia: No hernia is present.  Genitourinary:    Penis: Normal.      Testes: Normal.  Lymphadenopathy:     Cervical: No cervical adenopathy.  Skin:    General: Skin is warm.  Neurological:     Mental Status: He is alert. Mental status is at baseline.     Comments: Nonverbal. Follows directions appropriately.  Psychiatric:        Mood and Affect: Mood normal.        Behavior: Behavior normal.     Lab Results  Component Value Date   WBC 7.9 11/01/2019   HGB 14.0 11/01/2019   HCT 42.4 11/01/2019   PLT 367 11/01/2019   GLUCOSE 103 (H) 11/01/2019  CHOL 178 09/08/2019   TRIG 98 09/08/2019   HDL 39 (L) 09/08/2019   LDLCALC 121 (H) 09/08/2019   ALT 24 11/01/2019   AST 24 11/01/2019   NA 141 11/01/2019   K 4.7 11/01/2019   CL 105 11/01/2019   CREATININE 0.75 (L) 11/01/2019   BUN 14 11/01/2019   CO2 22 11/01/2019   TSH 1.160 11/01/2019   INR 1.1 12/01/2008   HGBA1C 5.8 (H) 09/08/2019      Assessment & Plan:  1. Encounter for general adult  medical examination with abnormal findings Impaired adult male.  Receiving excellent care at his adult home.  No current issues. I would not aggressively screened for cancer in this gentleman. 2. Need for vaccination - Flu Vaccine QUAD 6+ mos PF IM (Fluarix Quad PF)  3. PPD screening test - PPD  4. Mental impairment Stable  These are the goals we discussed: Goals   Recommend continue walking.  Enulose to walk.  Continue to eat healthy.  Meals provided by staff he does excellent job and eating once given.      This is a list of the screening recommended for you and due dates:  Health Maintenance  Topic Date Due  .  Hepatitis C: One time screening is recommended by Center for Disease Control  (CDC) for  adults born from [birth year] through 1964.   Never done  . COVID-19 Vaccine (1) Never done  . HIV Screening  Never done  . Tetanus Vaccine  Never done  . Colon Cancer Screening  03/08/2024  . Flu Shot  Completed      AN INDIVIDUALIZED CARE PLAN: was established or reinforced today.   SELF MANAGEMENT: The patient and I together assessed ways to personally work towards obtaining the recommended goals  Support needs The patient and/or family needs were assessed and services were offered and not necessary at this time.    Follow-up: Return in about 6 months (around 09/24/2020) for fasting. Rochel Brome [facility] [phone]

## 2020-03-29 ENCOUNTER — Encounter: Payer: Self-pay | Admitting: Family Medicine

## 2020-04-09 ENCOUNTER — Other Ambulatory Visit: Payer: Self-pay | Admitting: Physician Assistant

## 2020-04-09 DIAGNOSIS — I10 Essential (primary) hypertension: Secondary | ICD-10-CM

## 2020-04-09 DIAGNOSIS — E782 Mixed hyperlipidemia: Secondary | ICD-10-CM

## 2020-04-09 MED ORDER — VALSARTAN 40 MG PO TABS: 40.0000 mg | ORAL_TABLET | Freq: Every day | ORAL | 10 refills | Status: DC

## 2020-04-09 MED ORDER — ATORVASTATIN CALCIUM 10 MG PO TABS: 10.0000 mg | ORAL_TABLET | Freq: Every day | ORAL | 10 refills | Status: DC

## 2020-04-17 ENCOUNTER — Other Ambulatory Visit: Payer: Self-pay

## 2020-04-17 DIAGNOSIS — K219 Gastro-esophageal reflux disease without esophagitis: Secondary | ICD-10-CM

## 2020-04-17 DIAGNOSIS — I10 Essential (primary) hypertension: Secondary | ICD-10-CM

## 2020-04-18 MED ORDER — PANTOPRAZOLE SODIUM 40 MG PO TBEC: 40.0000 mg | DELAYED_RELEASE_TABLET | Freq: Two times a day (BID) | ORAL | 1 refills | Status: DC

## 2020-04-18 MED ORDER — AMLODIPINE BESYLATE 10 MG PO TABS: 10.0000 mg | ORAL_TABLET | Freq: Every day | ORAL | 1 refills | Status: DC

## 2020-04-19 DIAGNOSIS — Z20828 Contact with and (suspected) exposure to other viral communicable diseases: Secondary | ICD-10-CM | POA: Diagnosis not present

## 2020-04-28 DIAGNOSIS — Z20828 Contact with and (suspected) exposure to other viral communicable diseases: Secondary | ICD-10-CM | POA: Diagnosis not present

## 2020-05-01 DIAGNOSIS — F419 Anxiety disorder, unspecified: Secondary | ICD-10-CM | POA: Diagnosis not present

## 2020-05-23 ENCOUNTER — Other Ambulatory Visit: Payer: Medicare Other

## 2020-05-29 ENCOUNTER — Other Ambulatory Visit: Payer: Medicare Other

## 2020-05-30 ENCOUNTER — Other Ambulatory Visit: Payer: Medicare Other

## 2020-05-30 DIAGNOSIS — E782 Mixed hyperlipidemia: Secondary | ICD-10-CM | POA: Diagnosis not present

## 2020-05-30 DIAGNOSIS — I10 Essential (primary) hypertension: Secondary | ICD-10-CM | POA: Diagnosis not present

## 2020-05-30 DIAGNOSIS — R7303 Prediabetes: Secondary | ICD-10-CM

## 2020-05-31 LAB — CBC WITH DIFFERENTIAL/PLATELET
Basophils Absolute: 0.1 10*3/uL (ref 0.0–0.2)
Basos: 1 %
EOS (ABSOLUTE): 0.3 10*3/uL (ref 0.0–0.4)
Eos: 4 %
Hematocrit: 43.4 % (ref 37.5–51.0)
Hemoglobin: 14.3 g/dL (ref 13.0–17.7)
Immature Grans (Abs): 0.1 10*3/uL (ref 0.0–0.1)
Immature Granulocytes: 1 %
Lymphocytes Absolute: 1.8 10*3/uL (ref 0.7–3.1)
Lymphs: 19 %
MCH: 29.2 pg (ref 26.6–33.0)
MCHC: 32.9 g/dL (ref 31.5–35.7)
MCV: 89 fL (ref 79–97)
Monocytes Absolute: 0.9 10*3/uL (ref 0.1–0.9)
Monocytes: 9 %
Neutrophils Absolute: 6.7 10*3/uL (ref 1.4–7.0)
Neutrophils: 66 %
Platelets: 303 10*3/uL (ref 150–450)
RBC: 4.9 x10E6/uL (ref 4.14–5.80)
RDW: 13.3 % (ref 11.6–15.4)
WBC: 9.8 10*3/uL (ref 3.4–10.8)

## 2020-05-31 LAB — COMPREHENSIVE METABOLIC PANEL
ALT: 25 IU/L (ref 0–44)
AST: 23 IU/L (ref 0–40)
Albumin/Globulin Ratio: 1.3 (ref 1.2–2.2)
Albumin: 4 g/dL (ref 3.8–4.9)
Alkaline Phosphatase: 112 IU/L (ref 44–121)
BUN/Creatinine Ratio: 16 (ref 9–20)
BUN: 15 mg/dL (ref 6–24)
Bilirubin Total: 0.2 mg/dL (ref 0.0–1.2)
CO2: 23 mmol/L (ref 20–29)
Calcium: 9.1 mg/dL (ref 8.7–10.2)
Chloride: 104 mmol/L (ref 96–106)
Creatinine, Ser: 0.96 mg/dL (ref 0.76–1.27)
GFR calc Af Amer: 101 mL/min/{1.73_m2} (ref >59)
GFR calc non Af Amer: 87 mL/min/{1.73_m2} (ref >59)
Globulin, Total: 3 g/dL (ref 1.5–4.5)
Glucose: 122 mg/dL — ABNORMAL HIGH (ref 65–99)
Potassium: 4.3 mmol/L (ref 3.5–5.2)
Sodium: 141 mmol/L (ref 134–144)
Total Protein: 7 g/dL (ref 6.0–8.5)

## 2020-05-31 LAB — LIPID PANEL
Chol/HDL Ratio: 6.1 ratio — ABNORMAL HIGH (ref 0.0–5.0)
Cholesterol, Total: 182 mg/dL (ref 100–199)
HDL: 30 mg/dL — ABNORMAL LOW (ref >39)
LDL Chol Calc (NIH): 116 mg/dL — ABNORMAL HIGH (ref 0–99)
Triglycerides: 206 mg/dL — ABNORMAL HIGH (ref 0–149)
VLDL Cholesterol Cal: 36 mg/dL (ref 5–40)

## 2020-05-31 LAB — HEMOGLOBIN A1C
Est. average glucose Bld gHb Est-mCnc: 126 mg/dL
Hgb A1c MFr Bld: 6 % — ABNORMAL HIGH (ref 4.8–5.6)

## 2020-05-31 LAB — CARDIOVASCULAR RISK ASSESSMENT

## 2020-06-01 ENCOUNTER — Encounter: Payer: Self-pay | Admitting: Nurse Practitioner

## 2020-06-01 ENCOUNTER — Ambulatory Visit (INDEPENDENT_AMBULATORY_CARE_PROVIDER_SITE_OTHER): Payer: Medicare Other | Admitting: Nurse Practitioner

## 2020-06-01 ENCOUNTER — Other Ambulatory Visit: Payer: Self-pay

## 2020-06-01 VITALS — BP 100/72 | HR 98 | Temp 97.6°F | Ht 69.0 in | Wt 216.0 lb

## 2020-06-01 DIAGNOSIS — N170 Acute kidney failure with tubular necrosis: Secondary | ICD-10-CM | POA: Diagnosis not present

## 2020-06-01 DIAGNOSIS — F79 Unspecified intellectual disabilities: Secondary | ICD-10-CM | POA: Diagnosis not present

## 2020-06-01 DIAGNOSIS — K5909 Other constipation: Secondary | ICD-10-CM | POA: Diagnosis not present

## 2020-06-01 DIAGNOSIS — Z79899 Other long term (current) drug therapy: Secondary | ICD-10-CM | POA: Diagnosis not present

## 2020-06-01 DIAGNOSIS — K5900 Constipation, unspecified: Secondary | ICD-10-CM | POA: Diagnosis not present

## 2020-06-01 DIAGNOSIS — F819 Developmental disorder of scholastic skills, unspecified: Secondary | ICD-10-CM | POA: Diagnosis present

## 2020-06-01 DIAGNOSIS — I959 Hypotension, unspecified: Secondary | ICD-10-CM | POA: Diagnosis not present

## 2020-06-01 DIAGNOSIS — J9811 Atelectasis: Secondary | ICD-10-CM | POA: Diagnosis not present

## 2020-06-01 DIAGNOSIS — J690 Pneumonitis due to inhalation of food and vomit: Secondary | ICD-10-CM | POA: Diagnosis not present

## 2020-06-01 DIAGNOSIS — R0902 Hypoxemia: Secondary | ICD-10-CM | POA: Diagnosis not present

## 2020-06-01 DIAGNOSIS — E785 Hyperlipidemia, unspecified: Secondary | ICD-10-CM | POA: Diagnosis present

## 2020-06-01 DIAGNOSIS — K562 Volvulus: Secondary | ICD-10-CM | POA: Diagnosis not present

## 2020-06-01 DIAGNOSIS — G9341 Metabolic encephalopathy: Secondary | ICD-10-CM | POA: Diagnosis not present

## 2020-06-01 DIAGNOSIS — R140 Abdominal distension (gaseous): Secondary | ICD-10-CM | POA: Diagnosis not present

## 2020-06-01 DIAGNOSIS — K5904 Chronic idiopathic constipation: Secondary | ICD-10-CM | POA: Insufficient documentation

## 2020-06-01 DIAGNOSIS — J90 Pleural effusion, not elsewhere classified: Secondary | ICD-10-CM | POA: Diagnosis not present

## 2020-06-01 DIAGNOSIS — K219 Gastro-esophageal reflux disease without esophagitis: Secondary | ICD-10-CM | POA: Diagnosis present

## 2020-06-01 DIAGNOSIS — R1084 Generalized abdominal pain: Secondary | ICD-10-CM | POA: Diagnosis not present

## 2020-06-01 DIAGNOSIS — R1310 Dysphagia, unspecified: Secondary | ICD-10-CM | POA: Diagnosis present

## 2020-06-01 DIAGNOSIS — F840 Autistic disorder: Secondary | ICD-10-CM | POA: Diagnosis present

## 2020-06-01 DIAGNOSIS — J9601 Acute respiratory failure with hypoxia: Secondary | ICD-10-CM | POA: Diagnosis not present

## 2020-06-01 DIAGNOSIS — Z87898 Personal history of other specified conditions: Secondary | ICD-10-CM | POA: Diagnosis not present

## 2020-06-01 DIAGNOSIS — F419 Anxiety disorder, unspecified: Secondary | ICD-10-CM | POA: Diagnosis present

## 2020-06-01 DIAGNOSIS — E872 Acidosis: Secondary | ICD-10-CM | POA: Diagnosis present

## 2020-06-01 DIAGNOSIS — K567 Ileus, unspecified: Secondary | ICD-10-CM | POA: Diagnosis not present

## 2020-06-01 DIAGNOSIS — J9690 Respiratory failure, unspecified, unspecified whether with hypoxia or hypercapnia: Secondary | ICD-10-CM | POA: Diagnosis not present

## 2020-06-01 DIAGNOSIS — K6389 Other specified diseases of intestine: Secondary | ICD-10-CM | POA: Diagnosis not present

## 2020-06-01 DIAGNOSIS — I10 Essential (primary) hypertension: Secondary | ICD-10-CM | POA: Diagnosis present

## 2020-06-01 DIAGNOSIS — E876 Hypokalemia: Secondary | ICD-10-CM | POA: Diagnosis not present

## 2020-06-01 DIAGNOSIS — R109 Unspecified abdominal pain: Secondary | ICD-10-CM | POA: Diagnosis not present

## 2020-06-01 HISTORY — DX: Chronic idiopathic constipation: K59.04

## 2020-06-01 MED ORDER — ALUM & MAG HYDROXIDE-SIMETH 200-200-20 MG/5ML PO SUSP: 15.0000 mL | Freq: Four times a day (QID) | ORAL | 0 refills | Status: DC | PRN

## 2020-06-01 MED ORDER — MAGNESIUM CITRATE PO SOLN: 1.0000 | Freq: Once | ORAL | 0 refills | Status: DC

## 2020-06-01 NOTE — Progress Notes (Signed)
Subjective:  Patient ID: [name], male    DOB: 1961-01-25  Age: 59 y.o. MRN: [id number]  Chief Complaint  Patient presents with  . Constipation    Not drinking alot of liquids    HPI  Patient is a 59 year old Caucasian male that presents with constipation for 1 week. He is autistic, non-verbal with intellectual disabilities; dwells in a group home; he is accompanied by group home staff member ([name]) that supplements medical history. [name] states she is his one-on-one care provider at the home. [name] states [name] usually is incontinent of 2-3 moderate to large soft brown bowel movements daily. Size and frequency of BMs have decreased significantly over the past week. [name] states [name] is typically continent of urine, has become incontinent of urine for 5 days.  Pt has history of chronic constipation. Current MAR lists lactulose TID and Psyllium daily for prophylaxis. [name] states patient has had significant decrease in physical activity, decreased po fluids and meals. Other medical history include seizures, aspiration pneumonia secondary to regurgitated food, GERD, hypertension, and hyperlipidemia.   Current Outpatient Medications on File Prior to Visit  Medication Sig Dispense Refill  . acetaminophen (TYLENOL) 500 MG tablet Take 500 mg by mouth every 6 (six) hours as needed.    Marland Kitchen alum & mag hydroxide-simeth (MAALOX/MYLANTA) 200-200-20 MG/5ML suspension Take 15 mLs by mouth every 6 (six) hours as needed for indigestion or heartburn. Take 15 ml by mouth after meals and at bedtime    . amLODipine (NORVASC) 10 MG tablet Take 1 tablet (10 mg total) by mouth daily. 90 tablet 1  . atorvastatin (LIPITOR) 10 MG tablet Take 1 tablet (10 mg total) by mouth daily. 30 tablet 10  . citalopram (CELEXA) 40 MG tablet Take 40 mg by mouth daily.    . clonazePAM (KLONOPIN) 0.5 MG tablet Take 0.5 mg by mouth 3 (three) times daily as needed. Take 1 tablet every morning, 1 at noon, 1/2 at 4 pm and 1 at  bedtime    . dextromethorphan-guaiFENesin (MUCINEX DM) 30-600 MG 12hr tablet Take 1 tablet by mouth 2 (two) times daily. 28 tablet 0  . Ensure (ENSURE) Take 237 mLs by mouth 2 (two) times daily between meals.    . lactulose (CHRONULAC) 10 GM/15ML solution GIVE 45 ML (30 GM) BY MOUTH 3 TIMES A DAY 1892 mL 10  . loratadine (CLARITIN) 10 MG tablet Take 10 mg by mouth daily.    . montelukast (SINGULAIR) 10 MG tablet Take 1 tablet (10 mg total) by mouth daily. 90 tablet 1  . Multiple Vitamin (MULTIVITAMIN) tablet Take 1 tablet by mouth daily. 90 tablet 0  . pantoprazole (PROTONIX) 40 MG tablet Take 1 tablet (40 mg total) by mouth 2 (two) times daily. Take twice daily 180 tablet 1  . psyllium (REGULOID) 0.52 g capsule Take 28.3 g by mouth.    . QUEtiapine (SEROQUEL) 200 MG tablet Take 200 mg by mouth. Take 1 every morning, 1 1/2 at noon , 1/2 at 4 pm and 1 at bedtime    . Starch, Thickening, LIQD 3 tsps per 4 oz of liquid. Recommend 12 oz liquid (water,tea, or juice) three times a day. 237 mL 11  . valsartan (DIOVAN) 40 MG tablet Take 1 tablet (40 mg total) by mouth daily. Take 1 daily 30 tablet 10   No current facility-administered medications on file prior to visit.   Past Medical History:  Diagnosis Date  . Essential hypertension   . GERD without esophagitis   .  Mixed hyperlipidemia   . Prediabetes   . Seizure disorder ([place])   . Severe intellectual disabilities    History reviewed. No pertinent surgical history.  History reviewed. No pertinent family history. Social History   Socioeconomic History  . Marital status: Single    Spouse name: Not on file  . Number of children: Not on file  . Years of education: Not on file  . Highest education level: Not on file  Occupational History  . Not on file  Tobacco Use  . Smoking status: Never Smoker  . Smokeless tobacco: Never Used  Substance and Sexual Activity  . Alcohol use: Never  . Drug use: Never  . Sexual activity: Not on file    Other Topics Concern  . Not on file  Social History Narrative  . Not on file   Social Determinants of Health   Financial Resource Strain:   . Difficulty of Paying Living Expenses: Not on file  Food Insecurity:   . Worried About Charity fundraiser in the Last Year: Not on file  . Ran Out of Food in the Last Year: Not on file  Transportation Needs:   . Lack of Transportation (Medical): Not on file  . Lack of Transportation (Non-Medical): Not on file  Physical Activity:   . Days of Exercise per Week: Not on file  . Minutes of Exercise per Session: Not on file  Stress:   . Feeling of Stress : Not on file  Social Connections:   . Frequency of Communication with Friends and Family: Not on file  . Frequency of Social Gatherings with Friends and Family: Not on file  . Attends Religious Services: Not on file  . Active Member of Clubs or Organizations: Not on file  . Attends Archivist Meetings: Not on file  . Marital Status: Not on file    Review of Systems  Constitutional: Positive for activity change and appetite change. Negative for chills, fatigue, fever and unexpected weight change.  HENT: Negative for congestion, ear pain, sinus pressure, sinus pain and sore throat.   Respiratory: Negative for cough, chest tightness and wheezing.   Cardiovascular: Negative for chest pain.  Gastrointestinal: Positive for constipation (Decreased volume and frequency of bowel movements). Negative for blood in stool, diarrhea, nausea and vomiting.  Genitourinary: Negative for frequency and urgency.       New incontinence of urine frequent episodes  Musculoskeletal: Negative for back pain and myalgias.  Skin: Negative for rash.  Neurological: Negative for dizziness.     Objective:  BP 100/72 (BP Location: Left Arm, Patient Position: Sitting)   Pulse 98   Temp 97.6 F (36.4 C) (Temporal)   Ht 5\' 9"  (1.753 m)   Wt 216 lb (98 kg)   SpO2 97%   BMI 31.90 kg/m   BP/Weight  06/01/2020 03/26/2020 1/93/7902  Systolic BP 409 735 329  Diastolic BP 72 68 78  Wt. (Lbs) 216 206 207.4  BMI 31.9 30.42 30.63    Physical Exam Vitals reviewed.  Constitutional:      Appearance: He is ill-appearing.     Comments: Facial grimacing and moaning present  HENT:     Head: Normocephalic.     Right Ear: Tympanic membrane normal.     Left Ear: Tympanic membrane normal.     Nose: Nose normal.     Mouth/Throat:     Mouth: Mucous membranes are moist.  Eyes:     Pupils: Pupils are equal, round, and reactive  to light.  Cardiovascular:     Rate and Rhythm: Normal rate and regular rhythm.     Pulses: Normal pulses.     Heart sounds: Normal heart sounds.     Comments: BP 100/72, pulse 98 Pulmonary:     Effort: Pulmonary effort is normal.     Breath sounds: Normal breath sounds.  Abdominal:     General: There is distension.     Tenderness: There is abdominal tenderness.     Comments: Hypoactive bowel sounds noted in all 4 quadrants. Abdomen firm; No fecal impaction noted with digital rectal exam  Genitourinary:    Penis: Normal.      Testes: Normal.  Musculoskeletal:        General: Normal range of motion.  Skin:    General: Skin is warm and dry.     Capillary Refill: Capillary refill takes less than 2 seconds.  Neurological:     Mental Status: He is alert. Mental status is at baseline.       filed       Lab Results  Component Value Date   WBC 9.8 05/30/2020   HGB 14.3 05/30/2020   HCT 43.4 05/30/2020   PLT 303 05/30/2020   GLUCOSE 122 (H) 05/30/2020   CHOL 182 05/30/2020   TRIG 206 (H) 05/30/2020   HDL 30 (L) 05/30/2020   LDLCALC 116 (H) 05/30/2020   ALT 25 05/30/2020   AST 23 05/30/2020   NA 141 05/30/2020   K 4.3 05/30/2020   CL 104 05/30/2020   CREATININE 0.96 05/30/2020   BUN 15 05/30/2020   CO2 23 05/30/2020   TSH 1.160 11/01/2019   INR 1.1 12/01/2008   HGBA1C 6.0 (H) 05/30/2020      Assessment & Plan:   1. Generalized abdominal  pain -Stat abd CT at [place] with Differential/Platelet-stat at [place] metabolic panel-stat at [facility]  2. Other constipation  3. Mentally disabled    Report to [facility] for abd CT and labs Wait for results to be called to on-call physician (Dr Henrene Pastor) Return to home-take Mag Citrate as directed pending labs Return in 1-week for follow-up      Follow-up: 1 week pending CT and lab reports  An After Visit Summary was printed and given to the patient.  Jerrell Belfast, DNP [facility] [phone]

## 2020-06-01 NOTE — Patient Instructions (Addendum)
Report to [facility] for abd CT and labs Wait for results to be called to on-call physician (Dr Henrene Pastor) Return to home-take Mag Citrate as directed pending labs Return in 1-week for follow-up  Abdominal Pain, Adult Many things can cause belly (abdominal) pain. Most times, belly pain is not dangerous. Many cases of belly pain can be watched and treated at home. Sometimes, though, belly pain is serious. Your doctor will try to find the cause of your belly pain. Follow these instructions at home:  Medicines  Take over-the-counter and prescription medicines only as told by your doctor.  Do not take medicines that help you poop (laxatives) unless told by your doctor. General instructions  Watch your belly pain for any changes.  Drink enough fluid to keep your pee (urine) pale yellow.  Keep all follow-up visits as told by your doctor. This is important. Contact a doctor if:  Your belly pain changes or gets worse.  You are not hungry, or you lose weight without trying.  You are having trouble pooping (constipated) or have watery poop (diarrhea) for more than 2-3 days.  You have pain when you pee or poop.  Your belly pain wakes you up at night.  Your pain gets worse with meals, after eating, or with certain foods.  You are vomiting and cannot keep anything down.  You have a fever.  You have blood in your pee. Get help right away if:  Your pain does not go away as soon as your doctor says it should.  You cannot stop vomiting.  Your pain is only in areas of your belly, such as the right side or the left lower part of the belly.  You have bloody or black poop, or poop that looks like tar.  You have very bad pain, cramping, or bloating in your belly.  You have signs of not having enough fluid or water in your body (dehydration), such as: ? Dark pee, very little pee, or no pee. ? Cracked lips. ? Dry mouth. ? Sunken eyes. ? Sleepiness. ? Weakness.  You have trouble  breathing or chest pain. Summary  Many cases of belly pain can be watched and treated at home.  Watch your belly pain for any changes.  Take over-the-counter and prescription medicines only as told by your doctor.  Contact a doctor if your belly pain changes or gets worse.  Get help right away if you have very bad pain, cramping, or bloating in your belly. This information is not intended to replace advice given to you by your health care provider. Make sure you discuss any questions you have with your health care provider. Document Revised: 11/22/2018 Document Reviewed: 11/22/2018 Elsevier Patient Education  [place]. Constipation, Adult Constipation is when a person:  Poops (has a bowel movement) fewer times in a week than normal.  Has a hard time pooping.  Has poop that is dry, hard, or bigger than normal. Follow these instructions at home: Eating and drinking   Eat foods that have a lot of fiber, such as: ? Fresh fruits and vegetables. ? Whole grains. ? Beans.  Eat less of foods that are high in fat, low in fiber, or overly processed, such as: ? [place] fries. ? Hamburgers. ? Cookies. ? Candy. ? Soda.  Drink enough fluid to keep your pee (urine) clear or pale yellow. General instructions  Exercise regularly or as told by your doctor.  Go to the restroom when you feel like you need to poop.  Do not hold it in.  Take over-the-counter and prescription medicines only as told by your doctor. These include any fiber supplements.  Do pelvic floor retraining exercises, such as: ? Doing deep breathing while relaxing your lower belly (abdomen). ? Relaxing your pelvic floor while pooping.  Watch your condition for any changes.  Keep all follow-up visits as told by your doctor. This is important. Contact a doctor if:  You have pain that gets worse.  You have a fever.  You have not pooped for 4 days.  You throw up (vomit).  You are not hungry.  You  lose weight.  You are bleeding from the anus.  You have thin, pencil-like poop (stool). Get help right away if:  You have a fever, and your symptoms suddenly get worse.  You leak poop or have blood in your poop.  Your belly feels hard or bigger than normal (is bloated).  You have very bad belly pain.  You feel dizzy or you faint. This information is not intended to replace advice given to you by your health care provider. Make sure you discuss any questions you have with your health care provider. Document Revised: 06/26/2017 Document Reviewed: 01/02/2016 Elsevier Patient Education  2020 [employer].

## 2020-06-02 DIAGNOSIS — J9601 Acute respiratory failure with hypoxia: Secondary | ICD-10-CM | POA: Diagnosis not present

## 2020-06-02 DIAGNOSIS — K562 Volvulus: Secondary | ICD-10-CM | POA: Diagnosis not present

## 2020-06-02 DIAGNOSIS — F840 Autistic disorder: Secondary | ICD-10-CM | POA: Diagnosis not present

## 2020-06-02 DIAGNOSIS — J690 Pneumonitis due to inhalation of food and vomit: Secondary | ICD-10-CM | POA: Diagnosis not present

## 2020-06-03 DIAGNOSIS — J9811 Atelectasis: Secondary | ICD-10-CM | POA: Diagnosis not present

## 2020-06-03 DIAGNOSIS — K562 Volvulus: Secondary | ICD-10-CM | POA: Diagnosis not present

## 2020-06-03 DIAGNOSIS — J690 Pneumonitis due to inhalation of food and vomit: Secondary | ICD-10-CM | POA: Diagnosis not present

## 2020-06-03 DIAGNOSIS — J9601 Acute respiratory failure with hypoxia: Secondary | ICD-10-CM | POA: Diagnosis not present

## 2020-06-04 DIAGNOSIS — J690 Pneumonitis due to inhalation of food and vomit: Secondary | ICD-10-CM | POA: Diagnosis not present

## 2020-06-04 DIAGNOSIS — K562 Volvulus: Secondary | ICD-10-CM | POA: Diagnosis not present

## 2020-06-04 DIAGNOSIS — K567 Ileus, unspecified: Secondary | ICD-10-CM | POA: Diagnosis not present

## 2020-06-04 DIAGNOSIS — J9601 Acute respiratory failure with hypoxia: Secondary | ICD-10-CM | POA: Diagnosis not present

## 2020-06-04 DIAGNOSIS — K6389 Other specified diseases of intestine: Secondary | ICD-10-CM | POA: Diagnosis not present

## 2020-06-05 DIAGNOSIS — J9601 Acute respiratory failure with hypoxia: Secondary | ICD-10-CM | POA: Diagnosis not present

## 2020-06-05 DIAGNOSIS — K562 Volvulus: Secondary | ICD-10-CM | POA: Diagnosis not present

## 2020-06-05 DIAGNOSIS — J690 Pneumonitis due to inhalation of food and vomit: Secondary | ICD-10-CM | POA: Diagnosis not present

## 2020-06-06 DIAGNOSIS — R140 Abdominal distension (gaseous): Secondary | ICD-10-CM | POA: Diagnosis not present

## 2020-06-06 DIAGNOSIS — K6389 Other specified diseases of intestine: Secondary | ICD-10-CM | POA: Diagnosis not present

## 2020-06-08 ENCOUNTER — Ambulatory Visit: Payer: Medicare Other | Admitting: Nurse Practitioner

## 2020-06-08 DIAGNOSIS — R109 Unspecified abdominal pain: Secondary | ICD-10-CM | POA: Diagnosis not present

## 2020-06-08 DIAGNOSIS — R0902 Hypoxemia: Secondary | ICD-10-CM | POA: Diagnosis not present

## 2020-06-08 DIAGNOSIS — J9811 Atelectasis: Secondary | ICD-10-CM | POA: Diagnosis not present

## 2020-06-09 DIAGNOSIS — K562 Volvulus: Secondary | ICD-10-CM | POA: Diagnosis not present

## 2020-06-09 DIAGNOSIS — J9601 Acute respiratory failure with hypoxia: Secondary | ICD-10-CM | POA: Diagnosis not present

## 2020-06-09 DIAGNOSIS — J690 Pneumonitis due to inhalation of food and vomit: Secondary | ICD-10-CM | POA: Diagnosis not present

## 2020-06-10 DIAGNOSIS — K562 Volvulus: Secondary | ICD-10-CM | POA: Diagnosis not present

## 2020-06-10 DIAGNOSIS — J690 Pneumonitis due to inhalation of food and vomit: Secondary | ICD-10-CM | POA: Diagnosis not present

## 2020-06-10 DIAGNOSIS — J9601 Acute respiratory failure with hypoxia: Secondary | ICD-10-CM | POA: Diagnosis not present

## 2020-06-11 DIAGNOSIS — K562 Volvulus: Secondary | ICD-10-CM | POA: Diagnosis not present

## 2020-06-11 DIAGNOSIS — J690 Pneumonitis due to inhalation of food and vomit: Secondary | ICD-10-CM | POA: Diagnosis not present

## 2020-06-11 DIAGNOSIS — J9601 Acute respiratory failure with hypoxia: Secondary | ICD-10-CM | POA: Diagnosis not present

## 2020-06-12 DIAGNOSIS — J690 Pneumonitis due to inhalation of food and vomit: Secondary | ICD-10-CM | POA: Diagnosis not present

## 2020-06-12 DIAGNOSIS — R1084 Generalized abdominal pain: Secondary | ICD-10-CM | POA: Diagnosis not present

## 2020-06-12 DIAGNOSIS — J9601 Acute respiratory failure with hypoxia: Secondary | ICD-10-CM | POA: Diagnosis not present

## 2020-06-12 DIAGNOSIS — K562 Volvulus: Secondary | ICD-10-CM | POA: Diagnosis not present

## 2020-06-13 DIAGNOSIS — K562 Volvulus: Secondary | ICD-10-CM | POA: Diagnosis not present

## 2020-06-13 DIAGNOSIS — J9601 Acute respiratory failure with hypoxia: Secondary | ICD-10-CM | POA: Diagnosis not present

## 2020-06-13 DIAGNOSIS — J690 Pneumonitis due to inhalation of food and vomit: Secondary | ICD-10-CM | POA: Diagnosis not present

## 2020-06-14 DIAGNOSIS — K562 Volvulus: Secondary | ICD-10-CM | POA: Diagnosis not present

## 2020-06-14 DIAGNOSIS — J690 Pneumonitis due to inhalation of food and vomit: Secondary | ICD-10-CM | POA: Diagnosis not present

## 2020-06-14 DIAGNOSIS — J9601 Acute respiratory failure with hypoxia: Secondary | ICD-10-CM | POA: Diagnosis not present

## 2020-06-15 DIAGNOSIS — J690 Pneumonitis due to inhalation of food and vomit: Secondary | ICD-10-CM | POA: Diagnosis not present

## 2020-06-15 DIAGNOSIS — K562 Volvulus: Secondary | ICD-10-CM | POA: Diagnosis not present

## 2020-06-15 DIAGNOSIS — J9601 Acute respiratory failure with hypoxia: Secondary | ICD-10-CM | POA: Diagnosis not present

## 2020-06-16 DIAGNOSIS — J690 Pneumonitis due to inhalation of food and vomit: Secondary | ICD-10-CM | POA: Diagnosis not present

## 2020-06-16 DIAGNOSIS — J9601 Acute respiratory failure with hypoxia: Secondary | ICD-10-CM | POA: Diagnosis not present

## 2020-06-16 DIAGNOSIS — K562 Volvulus: Secondary | ICD-10-CM | POA: Diagnosis not present

## 2020-06-17 DIAGNOSIS — J9601 Acute respiratory failure with hypoxia: Secondary | ICD-10-CM | POA: Diagnosis not present

## 2020-06-17 DIAGNOSIS — K562 Volvulus: Secondary | ICD-10-CM | POA: Diagnosis not present

## 2020-06-17 DIAGNOSIS — J690 Pneumonitis due to inhalation of food and vomit: Secondary | ICD-10-CM | POA: Diagnosis not present

## 2020-06-18 DIAGNOSIS — K562 Volvulus: Secondary | ICD-10-CM | POA: Diagnosis not present

## 2020-06-18 DIAGNOSIS — J9601 Acute respiratory failure with hypoxia: Secondary | ICD-10-CM | POA: Diagnosis not present

## 2020-06-18 DIAGNOSIS — J690 Pneumonitis due to inhalation of food and vomit: Secondary | ICD-10-CM | POA: Diagnosis not present

## 2020-06-25 ENCOUNTER — Encounter: Payer: Self-pay | Admitting: Nurse Practitioner

## 2020-06-25 ENCOUNTER — Other Ambulatory Visit: Payer: Self-pay

## 2020-06-25 ENCOUNTER — Ambulatory Visit (INDEPENDENT_AMBULATORY_CARE_PROVIDER_SITE_OTHER): Payer: Medicare Other | Admitting: Nurse Practitioner

## 2020-06-25 VITALS — BP 122/90 | HR 103 | Temp 97.2°F | Ht 69.0 in | Wt 199.0 lb

## 2020-06-25 DIAGNOSIS — Z87828 Personal history of other (healed) physical injury and trauma: Secondary | ICD-10-CM

## 2020-06-25 DIAGNOSIS — J9811 Atelectasis: Secondary | ICD-10-CM | POA: Diagnosis not present

## 2020-06-25 DIAGNOSIS — E876 Hypokalemia: Secondary | ICD-10-CM | POA: Diagnosis not present

## 2020-06-25 DIAGNOSIS — Z8701 Personal history of pneumonia (recurrent): Secondary | ICD-10-CM

## 2020-06-25 DIAGNOSIS — Z09 Encounter for follow-up examination after completed treatment for conditions other than malignant neoplasm: Secondary | ICD-10-CM

## 2020-06-25 DIAGNOSIS — K562 Volvulus: Secondary | ICD-10-CM | POA: Diagnosis not present

## 2020-06-25 DIAGNOSIS — R1319 Other dysphagia: Secondary | ICD-10-CM | POA: Diagnosis not present

## 2020-06-25 DIAGNOSIS — G40909 Epilepsy, unspecified, not intractable, without status epilepticus: Secondary | ICD-10-CM

## 2020-06-25 DIAGNOSIS — F840 Autistic disorder: Secondary | ICD-10-CM

## 2020-06-25 DIAGNOSIS — Z8709 Personal history of other diseases of the respiratory system: Secondary | ICD-10-CM

## 2020-06-25 NOTE — Progress Notes (Signed)
Established Patient Office Visit  Subjective:  Patient ID: [name], male    DOB: 10/03/1960  Age: 59 y.o. MRN: [id number]  CC:  Chief Complaint  Patient presents with  . Hospitalization Follow-up    HPI [name] presents for hospital follow-up of cecal volvulus, appendectomy, and aspiration pneumonia with respiratory failure. Pt is autistic, non-verbal with cognitive disability. He resides in [facility]. He is accompanied by a group home staff member that reports medical history. Pt was taken to [facility] ED on 06/01/20 for evaluation of abdominal pain and constipation. An exploratory laparotomy was performed by Dr Lilia Pro where he was noted to have colonic dilation rather than volvulus. An incidental appendectomy was performed. [name] developed acute hypoxic failure post-operatively with aspiration pneumonia. He has a history of dysphagia and aspiration of fluids/food. He currently requires thickener to be added to liquids. Pneumonia was treated with IV antibiotics and oxygen. He was also treated with IV fluid bolus for acute kidney injury which resolved. He is receiving outpatient physical therapy in the group home per hospital physician for gait and balance training and speech therapy for swallowing evaluation for dysphagia. Pt returned to [facility] on 06/18/20. Per group home staff member, [name] is eating and drinking a moderate amount, continent of urine, and has had moderate amount of soft semi-formed brown stools daily. He has been out-of-bed ambulating without falling. The staff member states that he does seem fatigued due to daily napping in the afternoon.   Past Medical History:  Diagnosis Date  . Essential hypertension   . GERD without esophagitis   . Mixed hyperlipidemia   . Prediabetes   . Seizure disorder ([place])   . Severe intellectual disabilities     History reviewed. No pertinent surgical history.  History reviewed. No pertinent family  history.  Social History   Socioeconomic History  . Marital status: Single    Spouse name: Not on file  . Number of children: Not on file  . Years of education: Not on file  . Highest education level: Not on file  Occupational History  . Not on file  Tobacco Use  . Smoking status: Never Smoker  . Smokeless tobacco: Never Used  Substance and Sexual Activity  . Alcohol use: Never  . Drug use: Never  . Sexual activity: Not on file  Other Topics Concern  . Not on file  Social History Narrative  . Not on file   Social Determinants of Health   Financial Resource Strain:   . Difficulty of Paying Living Expenses: Not on file  Food Insecurity:   . Worried About Charity fundraiser in the Last Year: Not on file  . Ran Out of Food in the Last Year: Not on file  Transportation Needs:   . Lack of Transportation (Medical): Not on file  . Lack of Transportation (Non-Medical): Not on file  Physical Activity:   . Days of Exercise per Week: Not on file  . Minutes of Exercise per Session: Not on file  Stress:   . Feeling of Stress : Not on file  Social Connections:   . Frequency of Communication with Friends and Family: Not on file  . Frequency of Social Gatherings with Friends and Family: Not on file  . Attends Religious Services: Not on file  . Active Member of Clubs or Organizations: Not on file  . Attends Archivist Meetings: Not on file  . Marital Status: Not on file  Intimate Partner  Violence:   . Fear of Current or Ex-Partner: Not on file  . Emotionally Abused: Not on file  . Physically Abused: Not on file  . Sexually Abused: Not on file    Outpatient Medications Prior to Visit  Medication Sig Dispense Refill  . acetaminophen (TYLENOL) 500 MG tablet Take 500 mg by mouth every 6 (six) hours as needed.    Marland Kitchen amLODipine (NORVASC) 10 MG tablet Take 1 tablet (10 mg total) by mouth daily. 90 tablet 1  . atorvastatin (LIPITOR) 10 MG tablet Take 1 tablet (10 mg total) by  mouth daily. 30 tablet 10  . citalopram (CELEXA) 40 MG tablet Take 40 mg by mouth daily.    . clonazePAM (KLONOPIN) 0.5 MG tablet Take 0.5 mg by mouth 3 (three) times daily as needed. Take 1 tablet every morning, 1 at noon, 1/2 at 4 pm and 1 at bedtime    . Ensure (ENSURE) Take 237 mLs by mouth 2 (two) times daily between meals.    . lactulose (CHRONULAC) 10 GM/15ML solution GIVE 45 ML (30 GM) BY MOUTH 3 TIMES A DAY 1892 mL 10  . loratadine (CLARITIN) 10 MG tablet Take 10 mg by mouth daily.    . montelukast (SINGULAIR) 10 MG tablet Take 1 tablet (10 mg total) by mouth daily. 90 tablet 1  . Multiple Vitamin (MULTIVITAMIN) tablet Take 1 tablet by mouth daily. 90 tablet 0  . pantoprazole (PROTONIX) 40 MG tablet Take 1 tablet (40 mg total) by mouth 2 (two) times daily. Take twice daily 180 tablet 1  . potassium chloride (MICRO-K) 10 MEQ CR capsule Take 10 mEq by mouth daily.    . psyllium (REGULOID) 0.52 g capsule Take 28.3 g by mouth.    . QUEtiapine (SEROQUEL) 200 MG tablet Take 200 mg by mouth. Take 1 every morning, 1 1/2 at noon , 1/2 at 4 pm and 1 at bedtime    . Starch, Thickening, LIQD 3 tsps per 4 oz of liquid. Recommend 12 oz liquid (water,tea, or juice) three times a day. 237 mL 11  . valsartan (DIOVAN) 40 MG tablet Take 1 tablet (40 mg total) by mouth daily. Take 1 daily 30 tablet 10  . dextromethorphan-guaiFENesin (MUCINEX DM) 30-600 MG 12hr tablet Take 1 tablet by mouth 2 (two) times daily. 28 tablet 0  . oxyCODONE (OXY IR/ROXICODONE) 5 MG immediate release tablet Take 10 mg by mouth every 4 (four) hours as needed.     No facility-administered medications prior to visit.    Allergies  Allergen Reactions  . Augmentin [Amoxicillin-Pot Clavulanate]     ROS Review of Systems  Reason unable to perform ROS: Pt autistic, non-verbal-group home staff member present to supplement history.  Constitutional: Positive for fatigue (group home staff member states pt has been napping daily).  Negative for fever.  Respiratory: Positive for cough.   Gastrointestinal: Negative for diarrhea and vomiting.  Genitourinary: Negative for frequency.  Skin: Negative for rash.      Objective:    BP 122/90 (BP Location: Left Arm, Patient Position: Sitting)   Pulse (!) 103   Temp (!) 97.2 F (36.2 C) (Temporal)   Ht 5\' 9"  (1.753 m)   Wt 199 lb (90.3 kg)   SpO2 95%   BMI 29.39 kg/m   Physical Exam Vitals and nursing note reviewed.  Constitutional:      Appearance: Normal appearance. He is well-developed.  HENT:     Head: Normocephalic.     Right Ear: There  is impacted cerumen.     Left Ear: There is impacted cerumen.     Mouth/Throat:     Comments: Unable to visualize oral airway; pt unable to follow commands to open mouth Cardiovascular:     Rate and Rhythm: Normal rate and regular rhythm.     Pulses: Normal pulses.     Heart sounds: Normal heart sounds.  Pulmonary:     Effort: Pulmonary effort is normal. No respiratory distress.     Breath sounds: Normal breath sounds.  Abdominal:     General: Bowel sounds are normal.     Palpations: Abdomen is soft.     Tenderness: There is no abdominal tenderness.  Musculoskeletal:        General: Normal range of motion.  Skin:    General: Skin is warm and dry.     Capillary Refill: Capillary refill takes less than 2 seconds.  Neurological:     Mental Status: He is alert. Mental status is at baseline.  Psychiatric:        Behavior: Behavior normal.     Comments: At baseline     BP 122/90 (BP Location: Left Arm, Patient Position: Sitting)   Pulse (!) 103   Temp (!) 97.2 F (36.2 C) (Temporal)   Ht 5\' 9"  (1.753 m)   Wt 199 lb (90.3 kg)   SpO2 95%   BMI 29.39 kg/m  Wt Readings from Last 3 Encounters:  06/25/20 199 lb (90.3 kg)  06/01/20 216 lb (98 kg)  03/26/20 206 lb (93.4 kg)     Health Maintenance Due  Topic Date Due  . Hepatitis C Screening  Never done  . COVID-19 Vaccine (1) Never done  . HIV Screening   Never done  . TETANUS/TDAP  Never done     Lab Results  Component Value Date   TSH 1.160 11/01/2019   Lab Results  Component Value Date   WBC 9.8 05/30/2020   HGB 14.3 05/30/2020   HCT 43.4 05/30/2020   MCV 89 05/30/2020   PLT 303 05/30/2020   Lab Results  Component Value Date   NA 141 05/30/2020   K 4.3 05/30/2020   CO2 23 05/30/2020   GLUCOSE 122 (H) 05/30/2020   BUN 15 05/30/2020   CREATININE 0.96 05/30/2020   BILITOT 0.2 05/30/2020   ALKPHOS 112 05/30/2020   AST 23 05/30/2020   ALT 25 05/30/2020   PROT 7.0 05/30/2020   ALBUMIN 4.0 05/30/2020   CALCIUM 9.1 05/30/2020   Lab Results  Component Value Date   CHOL 182 05/30/2020   Lab Results  Component Value Date   HDL 30 (L) 05/30/2020   Lab Results  Component Value Date   LDLCALC 116 (H) 05/30/2020   Lab Results  Component Value Date   TRIG 206 (H) 05/30/2020   Lab Results  Component Value Date   CHOLHDL 6.1 (H) 05/30/2020   Lab Results  Component Value Date   HGBA1C 6.0 (H) 05/30/2020      Assessment & Plan:   1. Hospital discharge follow-up -Continue to monitor bowel movements -Continue Lactulose TID  2. Cecal volvulus -Continue Lactulose TID -Continue Psyllium daily  3. Hypokalemia - potassium chloride (MICRO-K) 10 MEQ CR capsule; Take 10 mEq by mouth daily. - Comprehensive metabolic panel  4. History of aspiration pneumonia -Encourage deep breathing and coughing -Flutter device twice daily -Monitor pulse ox as needed -Encourage physical activity -Continue starch thickening to liquids  5. Atelectasis of both lungs -Encourage deep breathing  and coughing -Flutter device twice daily -Monitor pulse ox as needed -Encourage physical activity  6. Autism disorder -Maintain safe environment at [facility]  7. Seizure disorder ([place]) -continue to monitor for seizure activity  8. History of respiratory failure -Monitor for signs of respiratory distress   9. Dysphagia  causing pulmonary aspiration with swallowing -Continue thickened liquids per speech pathology recommendations  10. 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

## 2020-06-25 NOTE — Patient Instructions (Addendum)
Encourage deep breathing and coughing Incentive spirometer daily Monitor oxygen saturation as needed Encourage physical activity daily  Keep follow-up appt with general surgeon on Thursday Return for annual physical exam  Aspiration Pneumonia Aspiration pneumonia is an infection in the lungs. It occurs when saliva or liquid contaminated with bacteria is inhaled (aspirated) into the lungs. When these things get into the lungs, swelling (inflammation) and infection can occur. This can make it difficult to breathe. Aspiration pneumonia is a serious condition and can be life threatening. What are the causes? This condition is caused when saliva or liquid from the mouth, throat, or stomach is inhaled into the lungs, and when those fluids are contaminated with bacteria. What increases the risk? The following factors may make you more likely to develop this condition:  A narrowing of the tube that carries food to the stomach (esophageal narrowing).  Having gastroesophageal reflux disease (GERD).  Having a weak immune system.  Having diabetes.  Having poor oral hygiene.  Being malnourished. The condition is more likely to occur when a person's cough (gag) reflex, or ability to swallow, has decreased. Some things that can cause this decrease include:  Having a brain injury or disease, such as stroke, seizures, Parkinson disease, dementia, or amyotrophic lateral sclerosis (ALS).  Being given a general anesthetic for procedures.  Drinking too much alcohol. If a person passes out and vomits, vomit can be inhaled into the lungs.  Taking certain medicines, such as tranquilizers or sedatives. What are the signs or symptoms? Symptoms of this condition include:  Fever.  A cough with secretions that are yellow, tan, or green.  Breathing problems, such as wheezing or shortness of breath.  Chest pain.  Being more tired than usual (fatigue).  Having a history of coughing while eating or  drinking.  Bad breath.  Bluish color to the lips, skin, or fingers. How is this diagnosed? This condition may be diagnosed based on:  A physical exam.  Tests, such as: ? Chest X-ray. ? Sputum culture. Saliva and mucus (sputum) are collected from the lungs or the tubes that carry air to the lungs (bronchi). The sputum is then tested for bacteria. ? Oximetry. A sensor or clip is placed on areas such as a finger, earlobe, or toe to measure the oxygen level in your blood. ? Blood tests. ? Swallowing study. This test looks at how food is swallowed and whether it goes into your breathing tube (trachea) or esophagus. ? Bronchoscopy. This test uses a flexible tube (bronchoscope) to see inside the lungs. How is this treated? This condition may be treated with:  Medicines. Antibiotic medicine will be given to kill the pneumonia bacteria. Other medicines may also be used to reduce fever or pain.  Breathing assistance and oxygen therapy. Depending on how well you are breathing, you may need to be given oxygen, or you may need breathing support from a breathing machine (ventilator).  Thoracentesis. This is a procedure to remove fluid that has built up in the space between the linings of the chest wall and the lungs.  Feeding tube and diet change. For people who have difficulty swallowing, a feeding tube might be placed in the stomach, or they may be asked to avoid certain food textures or liquids when eating. Follow these instructions at home: Medicines  Take over-the-counter and prescription medicines only as told by your health care provider. ? If you were prescribed an antibiotic medicine, take it as told by your health care provider. Do not  stop taking the antibiotic even if you start to feel better. ? Take cough medicine only if you are losing sleep. Cough medicine can prevent your body's natural ability to remove mucus from your lungs. General instructions  Carefully follow any eating  instructions you were given, such as avoiding certain food textures or thickening your liquids. Thickening liquids reduces the risk of developing aspiration pneumonia again.  Use breathing exercises such as postural drainage, deep breathing, and incentive spirometry to help expel secretions.  Rest as instructed by your health care provider.  Sleep in a semi-upright position at night. Try to sleep in a reclining chair, or place a few pillows under your head.  Do not use any products that contain nicotine or tobacco, such as cigarettes and e-cigarettes. If you need help quitting, ask your health care provider.  Keep all follow-up visits as told by your health care provider. This is important. Contact a health care provider if:  You have a fever.  You have a worsening cough with yellow, tan, or green secretions.  You have coughing while eating or drinking. Get help right away if:  You have worsening shortness of breath, wheezing, or difficulty breathing.  You have chest pain. Summary  Aspiration pneumonia is an infection in the lungs. It is caused when saliva or liquid from the mouth, throat, or stomach is inhaled into the lungs.  Aspiration pneumonia is more likely to occur when a person's cough reflex or ability to swallow has decreased.  Symptoms of aspiration pneumonia include coughing, breathing problems, fever, and chest pain.  Aspiration pneumonia may be treated with antibiotic medicine, other medicines to reduce pain or fever, and breathing assistance or oxygen therapy. This information is not intended to replace advice given to you by your health care provider. Make sure you discuss any questions you have with your health care provider. Document Revised: 06/26/2017 Document Reviewed: 08/19/2016 Elsevier Patient Education  [place].  Atelectasis, Adult  Atelectasis is a collapse of air sacs in the lungs (alveoli). The condition causes all or part of a lung to  collapse. Atelectasis is a common problem after surgery. Its severity depends on the size of lung tissue area involved and the underlying cause. When severe, it can lead to shortness of breath and heart problems. Atelectasis can develop suddenly or over a long period of time. Atelectasis that develops over a long period of time (chronic atelectasis) often leads to infection, scarring, and other problems. What are the causes? This condition may be caused by:  Shallow breathing.  Medicines that make breathing more shallow.  A blockage in an airway. Blockages can result from: ? A buildup of mucus. ? A tumor. ? An inhaled object (foreign body). ? Enlarged lymph nodes. ? Fluid in the lungs (pleural effusion). ? A blood clot in the lungs.  Outside pressure on the lung. Pressure can be due to: ? A tumor. ? Fluid in the lungs (pleural effusion). ? Air leaking between the lung and rib cage (pneumothorax). ? Enlarged lymph nodes.  Improper expansion of the lungs. This may occur in newborns because of: ? Prematurity. ? Low oxygen levels. ? Secretions at birth that block the airway. ? Amniotic fluid that goes into the lungs (aspiration). What increases the risk? This condition is more likely to develop in people who:  Have an injury or health problem that makes taking deep breaths difficult or painful.  Have certain infections or diseases, such as pneumonia or cystic fibrosis.  Have  had surgery on the chest or abdomen.  Have broken ribs.  Have a tight bandage around their chest.  Have a collapsed lung due to pneumothorax.  Take medicines that decrease the rate of their breathing or how deeply they breathe, like sedatives.  Lie flat for long periods of time. What are the signs or symptoms? Often, there are no symptoms for this condition. When symptoms do appear, they may include:  Shortness of breath.  Bluish color to the nails, lips, or mouth (cyanosis).  A cough. How is  this diagnosed? This condition may be diagnosed based on:  Symptoms.  A physical exam.  A chest X-ray. Sometimes specialized imaging tests are needed to diagnose the condition. How is this treated? Treatment for this condition depends on what caused the condition. Treatment may involve:  Coughing. Coughing helps loosen mucus in the airway.  Chest physiotherapy. This is a treatment to help loosen and clear mucus from the airways. It is done by clapping the chest.  Postural drainage techniques. This treatment involves positioning your body so your head is lower than your chest. It helps mucus drain from your airways.  An incentive spirometer. This is a device that is used to help with taking deeper breaths.  Positive pressure breathing. This is a form of breathing assistance in which air is forced into the lungs when you breathe in (inhale). You may have this treatment if your condition is severe.  Treatment of the underlying condition. Follow these instructions at home:  Take over-the-counter and prescription medicines only as told by your health care provider.  Practice taking relaxed and deep breaths when you are sitting. A good time to practice is when you are watching TV. Take a few deep breaths during each commercial break.  Make sure to lie on your unaffected side when you are lying down. For example, if you have atelectasis in your left lung, lie on your right side. This will help mucus drain from your airway.  Cough several times a day as told by your health care provider.  Perform chest physiotherapy or postural drainage techniques as told by your health care provider. If necessary, have someone help you.  If you were given a device to help with breathing, use it as told by your health care provider.  Stay as active as possible. Get help right away if:  Your breathing problems get worse.  You have severe chest pain.  You develop severe coughing.  You cough up  blood.  You have a fever.  You have persistent symptoms for more than 2-3 days.  Your symptoms suddenly get worse. This information is not intended to replace advice given to you by your health care provider. Make sure you discuss any questions you have with your health care provider. Document Revised: 06/26/2017 Document Reviewed: 12/17/2015 Elsevier Patient Education  2020 [employer]. How to Use an Incentive Spirometer An incentive spirometer is a tool that measures how well you are filling your lungs with each breath. Learning to take long, deep breaths using this tool can help you keep your lungs clear and active. This may help to reverse or lessen your chance of developing breathing (pulmonary) problems, especially infection. You may be asked to use a spirometer:  After a surgery.  If you have a lung problem or a history of smoking.  After a long period of time when you have been unable to move or be active. If the spirometer includes an indicator to show the highest  number that you have reached, your health care provider or respiratory therapist will help you set a goal. Keep a list (log) of your progress as told by your health care provider. What are the risks?  Breathing too quickly may cause dizziness or cause you to pass out. Take your time so you do not get dizzy or light-headed.  If you are in pain, you may need to take pain medicine before doing incentive spirometry. It is harder to take a deep breath if you are having pain. How to use your incentive spirometer  1. Sit up on the edge of your bed or on a chair. 2. Hold the incentive spirometer so that it is in an upright position. 3. Before you use the spirometer, breathe out normally. 4. Place the mouthpiece in your mouth. Make sure your lips are closed tightly around it. 5. Breathe in slowly and as deeply as you can through your mouth, causing the piston or the ball to rise toward the top of the chamber. 6. Hold your  breath for 3-5 seconds, or for as long as possible. ? If the spirometer includes a coach indicator, use this to guide you in breathing. Slow down your breathing if the indicator goes above the marked areas. 7. Remove the mouthpiece from your mouth and breathe out normally. The piston or ball will return to the bottom of the chamber. 8. Rest for a few seconds, then repeat the steps 10 or more times. ? Take your time and take a few normal breaths between deep breaths so that you do not get dizzy or light-headed. ? Do this every 1-2 hours when you are awake. 9. If the spirometer includes a goal marker to show the highest number you have reached (best effort), use this as a goal to work toward during each repetition. 10. After each set of 10 deep breaths, cough a few times. This will help to make sure that your lungs are clear. ? If you have an incision on your chest or abdomen from surgery, place a pillow or a rolled-up towel firmly against the incision when you cough. This can help to reduce pain from coughing. General tips  When you become able to get out of bed, walk around often and continue to cough to help clear your lungs.  Keep using the incentive spirometer until your health care provider says it is okay to stop using it. If you have been in the hospital, you may be told to keep using the spirometer at home. Contact a health care provider if:  You are having difficulty using the spirometer.  You have trouble using the spirometer as often as instructed.  Your pain medicine is not giving enough relief for you to use the spirometer as told.  You have a fever.  You develop shortness of breath. Get help right away if:  You develop a cough with bloody mucus from the lungs (bloody sputum).  You have fluid or blood coming from an incision site after you cough. Summary  An incentive spirometer is a tool that can help you learn to take long, deep breaths to keep your lungs clear and  active.  You may be asked to use a spirometer after a surgery, if you have a lung problem or a history of smoking, or if you have been inactive for a long period of time.  Use your incentive spirometer as instructed every 1-2 hours while you are awake.  If you have an incision on  your chest or abdomen, place a pillow or a rolled-up towel firmly against your incision when you cough. This will help to reduce pain. This information is not intended to replace advice given to you by your health care provider. Make sure you discuss any questions you have with your health care provider. Document Revised: 02/11/2019 Document Reviewed: 05/27/2017 Elsevier Patient Education  2020 [employer].

## 2020-06-26 LAB — COMPREHENSIVE METABOLIC PANEL
ALT: 26 IU/L (ref 0–44)
AST: 25 IU/L (ref 0–40)
Albumin/Globulin Ratio: 1.3 (ref 1.2–2.2)
Albumin: 4.3 g/dL (ref 3.8–4.9)
Alkaline Phosphatase: 126 IU/L — ABNORMAL HIGH (ref 44–121)
BUN/Creatinine Ratio: 15 (ref 9–20)
BUN: 12 mg/dL (ref 6–24)
Bilirubin Total: 0.2 mg/dL (ref 0.0–1.2)
CO2: 24 mmol/L (ref 20–29)
Calcium: 9.4 mg/dL (ref 8.7–10.2)
Chloride: 100 mmol/L (ref 96–106)
Creatinine, Ser: 0.82 mg/dL (ref 0.76–1.27)
GFR calc Af Amer: 113 mL/min/{1.73_m2} (ref >59)
GFR calc non Af Amer: 98 mL/min/{1.73_m2} (ref >59)
Globulin, Total: 3.3 g/dL (ref 1.5–4.5)
Glucose: 104 mg/dL — ABNORMAL HIGH (ref 65–99)
Potassium: 4.9 mmol/L (ref 3.5–5.2)
Sodium: 137 mmol/L (ref 134–144)
Total Protein: 7.6 g/dL (ref 6.0–8.5)

## 2020-06-29 DIAGNOSIS — Z09 Encounter for follow-up examination after completed treatment for conditions other than malignant neoplasm: Secondary | ICD-10-CM | POA: Diagnosis not present

## 2020-07-02 ENCOUNTER — Other Ambulatory Visit: Payer: Self-pay

## 2020-07-02 ENCOUNTER — Encounter: Payer: Self-pay | Admitting: Nurse Practitioner

## 2020-07-02 ENCOUNTER — Ambulatory Visit (INDEPENDENT_AMBULATORY_CARE_PROVIDER_SITE_OTHER): Payer: Medicare Other | Admitting: Nurse Practitioner

## 2020-07-02 VITALS — BP 102/68 | HR 111 | Temp 97.4°F | Ht 69.0 in | Wt 197.0 lb

## 2020-07-02 DIAGNOSIS — Z8709 Personal history of other diseases of the respiratory system: Secondary | ICD-10-CM

## 2020-07-02 DIAGNOSIS — G40909 Epilepsy, unspecified, not intractable, without status epilepticus: Secondary | ICD-10-CM

## 2020-07-02 DIAGNOSIS — I10 Essential (primary) hypertension: Secondary | ICD-10-CM | POA: Diagnosis not present

## 2020-07-02 DIAGNOSIS — J690 Pneumonitis due to inhalation of food and vomit: Secondary | ICD-10-CM | POA: Diagnosis not present

## 2020-07-02 DIAGNOSIS — R000 Tachycardia, unspecified: Secondary | ICD-10-CM | POA: Diagnosis not present

## 2020-07-02 DIAGNOSIS — J189 Pneumonia, unspecified organism: Secondary | ICD-10-CM | POA: Diagnosis not present

## 2020-07-02 DIAGNOSIS — R4701 Aphasia: Secondary | ICD-10-CM | POA: Diagnosis not present

## 2020-07-02 DIAGNOSIS — F840 Autistic disorder: Secondary | ICD-10-CM | POA: Diagnosis not present

## 2020-07-02 DIAGNOSIS — H6123 Impacted cerumen, bilateral: Secondary | ICD-10-CM | POA: Diagnosis not present

## 2020-07-02 DIAGNOSIS — Z8701 Personal history of pneumonia (recurrent): Secondary | ICD-10-CM | POA: Diagnosis not present

## 2020-07-02 DIAGNOSIS — J9811 Atelectasis: Secondary | ICD-10-CM | POA: Diagnosis not present

## 2020-07-02 NOTE — Patient Instructions (Addendum)
Obtain chest x-ray at [facility] today Continue prescribed medications Monitor bowel movements and document  Keep follow-up with Dr Tobie Poet on September 28, 2020 at [place] The ears produce a substance called earwax that helps keep bacteria out of the ear and protects the skin in the ear canal. Occasionally, earwax can build up in the ear and cause discomfort or hearing loss. What increases the risk? This condition is more likely to develop in people who:  Are male.  Are elderly.  Naturally produce more earwax.  Clean their ears often with cotton swabs.  Use earplugs often.  Use in-ear headphones often.  Wear hearing aids.  Have narrow ear canals.  Have earwax that is overly thick or sticky.  Have eczema.  Are dehydrated.  Have excess hair in the ear canal. What are the signs or symptoms? Symptoms of this condition include:  Reduced or muffled hearing.  A feeling of fullness in the ear or feeling that the ear is plugged.  Fluid coming from the ear.  Ear pain.  Ear itch.  Ringing in the ear.  Coughing.  An obvious piece of earwax that can be seen inside the ear canal. How is this diagnosed? This condition may be diagnosed based on:  Your symptoms.  Your medical history.  An ear exam. During the exam, your health care provider will look into your ear with an instrument called an otoscope. You may have tests, including a hearing test. How is this treated? This condition may be treated by:  Using ear drops to soften the earwax.  Having the earwax removed by a health care provider. The health care provider may: ? Flush the ear with water. ? Use an instrument that has a loop on the end (curette). ? Use a suction device.  Surgery to remove the wax buildup. This may be done in severe cases. Follow these instructions at home:   Take over-the-counter and prescription medicines only as told by your health care provider.  Do  not put any objects, including cotton swabs, into your ear. You can clean the opening of your ear canal with a washcloth or facial tissue.  Follow instructions from your health care provider about cleaning your ears. Do not over-clean your ears.  Drink enough fluid to keep your urine clear or pale yellow. This will help to thin the earwax.  Keep all follow-up visits as told by your health care provider. If earwax builds up in your ears often or if you use hearing aids, consider seeing your health care provider for routine, preventive ear cleanings. Ask your health care provider how often you should schedule your cleanings.  If you have hearing aids, clean them according to instructions from the manufacturer and your health care provider. Contact a health care provider if:  You have ear pain.  You develop a fever.  You have blood, pus, or other fluid coming from your ear.  You have hearing loss.  You have ringing in your ears that does not go away.  Your symptoms do not improve with treatment.  You feel like the room is spinning (vertigo). Summary  Earwax can build up in the ear and cause discomfort or hearing loss.  The most common symptoms of this condition include reduced or muffled hearing and a feeling of fullness in the ear or feeling that the ear is plugged.  This condition may be diagnosed based on your symptoms, your medical history, and an ear exam.  This  condition may be treated by using ear drops to soften the earwax or by having the earwax removed by a health care provider.  Do not put any objects, including cotton swabs, into your ear. You can clean the opening of your ear canal with a washcloth or facial tissue. This information is not intended to replace advice given to you by your health care provider. Make sure you discuss any questions you have with your health care provider. Document Revised: 06/26/2017 Document Reviewed: 09/24/2016 Elsevier Patient Education   2020 [employer].  Chest X-Ray A chest X-ray is a painless test that uses radiation to create images of the structures inside of your chest. Chest X-rays are used to look for many health conditions, including heart failure, pneumonia, tuberculosis, rib fractures, breathing disorders, and cancer. They may be used to diagnose chest pain, constant coughing, or trouble breathing. Tell a health care provider about:  Any allergies you have.  All medicines you are taking, including vitamins, herbs, eye drops, creams, and over-the-counter medicines.  Any surgeries you have had.  Any medical conditions you have.  Whether you are pregnant or may be pregnant. What are the risks? Getting a chest X-ray is a safe procedure. However, you will be exposed to a small amount of radiation. Being exposed to too much radiation over a lifetime can increase the risk of cancer. This risk is small, but it may occur if you have many X-rays throughout your life. What happens before the procedure?  You may be asked to remove glasses, jewelry, and any other metal objects.  You will be asked to undress from the waist up. You may be given a hospital gown to wear.  You may be asked to wear a protective lead apron to protect parts of your body from radiation. What happens during the procedure?   You will be asked to stand still as each picture is taken to get the best possible images.  You will be asked to take a deep breath and hold your breath for a few seconds.  The X-ray machine will create a picture of your chest using a tiny burst of radiation. This is painless.  More pictures may be taken from other angles. Typically, one picture will be taken while you face the X-ray camera, and another picture will be taken from the side while you stand. If you cannot stand, you may be asked to lie down. The procedure may vary among health care providers and hospitals. What happens after the procedure?  The X-ray(s)  will be reviewed by your health care provider or an X-ray (radiology) specialist.  It is up to you to get your test results. Ask your health care provider, or the department that is doing the test, when your results will be ready.  Your health care provider will tell you if you need more tests or a follow-up exam. Keep all follow-up visits as told by your health care provider. This is important. Summary  A chest X-ray is a safe, painless test that is used to examine the inside of the chest, heart, and lungs.  You will need to undress from the waist up and remove jewelry and metal objects before the procedure.  You will be exposed to a small amount of radiation during the procedure.  The X-ray machine will take one or more pictures of your chest while you remain as still as possible.  Later, a health care provider or specialist will review the test results with you.  This information is not intended to replace advice given to you by your health care provider. Make sure you discuss any questions you have with your health care provider. Document Revised: 11/03/2018 Document Reviewed: 09/09/2016 Elsevier Patient Education  [place]. Aspiration Pneumonia Aspiration pneumonia is an infection in the lungs. It occurs when saliva or liquid contaminated with bacteria is inhaled (aspirated) into the lungs. When these things get into the lungs, swelling (inflammation) and infection can occur. This can make it difficult to breathe. Aspiration pneumonia is a serious condition and can be life threatening. What are the causes? This condition is caused when saliva or liquid from the mouth, throat, or stomach is inhaled into the lungs, and when those fluids are contaminated with bacteria. What increases the risk? The following factors may make you more likely to develop this condition:  A narrowing of the tube that carries food to the stomach (esophageal narrowing).  Having gastroesophageal reflux  disease (GERD).  Having a weak immune system.  Having diabetes.  Having poor oral hygiene.  Being malnourished. The condition is more likely to occur when a person's cough (gag) reflex, or ability to swallow, has decreased. Some things that can cause this decrease include:  Having a brain injury or disease, such as stroke, seizures, Parkinson disease, dementia, or amyotrophic lateral sclerosis (ALS).  Being given a general anesthetic for procedures.  Drinking too much alcohol. If a person passes out and vomits, vomit can be inhaled into the lungs.  Taking certain medicines, such as tranquilizers or sedatives. What are the signs or symptoms? Symptoms of this condition include:  Fever.  A cough with secretions that are yellow, tan, or green.  Breathing problems, such as wheezing or shortness of breath.  Chest pain.  Being more tired than usual (fatigue).  Having a history of coughing while eating or drinking.  Bad breath.  Bluish color to the lips, skin, or fingers. How is this diagnosed? This condition may be diagnosed based on:  A physical exam.  Tests, such as: ? Chest X-ray. ? Sputum culture. Saliva and mucus (sputum) are collected from the lungs or the tubes that carry air to the lungs (bronchi). The sputum is then tested for bacteria. ? Oximetry. A sensor or clip is placed on areas such as a finger, earlobe, or toe to measure the oxygen level in your blood. ? Blood tests. ? Swallowing study. This test looks at how food is swallowed and whether it goes into your breathing tube (trachea) or esophagus. ? Bronchoscopy. This test uses a flexible tube (bronchoscope) to see inside the lungs. How is this treated? This condition may be treated with:  Medicines. Antibiotic medicine will be given to kill the pneumonia bacteria. Other medicines may also be used to reduce fever or pain.  Breathing assistance and oxygen therapy. Depending on how well you are breathing, you  may need to be given oxygen, or you may need breathing support from a breathing machine (ventilator).  Thoracentesis. This is a procedure to remove fluid that has built up in the space between the linings of the chest wall and the lungs.  Feeding tube and diet change. For people who have difficulty swallowing, a feeding tube might be placed in the stomach, or they may be asked to avoid certain food textures or liquids when eating. Follow these instructions at home: Medicines  Take over-the-counter and prescription medicines only as told by your health care provider. ? If you were prescribed an antibiotic medicine,  take it as told by your health care provider. Do not stop taking the antibiotic even if you start to feel better. ? Take cough medicine only if you are losing sleep. Cough medicine can prevent your body's natural ability to remove mucus from your lungs. General instructions  Carefully follow any eating instructions you were given, such as avoiding certain food textures or thickening your liquids. Thickening liquids reduces the risk of developing aspiration pneumonia again.  Use breathing exercises such as postural drainage, deep breathing, and incentive spirometry to help expel secretions.  Rest as instructed by your health care provider.  Sleep in a semi-upright position at night. Try to sleep in a reclining chair, or place a few pillows under your head.  Do not use any products that contain nicotine or tobacco, such as cigarettes and e-cigarettes. If you need help quitting, ask your health care provider.  Keep all follow-up visits as told by your health care provider. This is important. Contact a health care provider if:  You have a fever.  You have a worsening cough with yellow, tan, or green secretions.  You have coughing while eating or drinking. Get help right away if:  You have worsening shortness of breath, wheezing, or difficulty breathing.  You have chest  pain. Summary  Aspiration pneumonia is an infection in the lungs. It is caused when saliva or liquid from the mouth, throat, or stomach is inhaled into the lungs.  Aspiration pneumonia is more likely to occur when a person's cough reflex or ability to swallow has decreased.  Symptoms of aspiration pneumonia include coughing, breathing problems, fever, and chest pain.  Aspiration pneumonia may be treated with antibiotic medicine, other medicines to reduce pain or fever, and breathing assistance or oxygen therapy. This information is not intended to replace advice given to you by your health care provider. Make sure you discuss any questions you have with your health care provider. Document Revised: 06/26/2017 Document Reviewed: 08/19/2016 Elsevier Patient Education  2020 [employer].

## 2020-07-02 NOTE — Progress Notes (Signed)
Subjective:  Patient ID: [name], male    DOB: 10/27/1960  Age: 59 y.o. MRN: [id number]  Chief Complaint  Patient presents with  . Chronic Follow up    HPI  [name] is a 59 year old Caucasian male that presents for follow-up of impacted ear wax that was noted during hospital follow-up visit one week ago. Pt recently was discharged from [facility] s/p exploratory laparotomy for dilated colon. He subsequently developed aspiration pneumonia and acute kidney injury. Patient is autistic with developmental delays and non-verbal. He resides in a group home. Group home staff member present today. Other medical history includes hypertension, GERD, seizure disorder, and mixed hyperlipidemia.     Current Outpatient Medications on File Prior to Visit  Medication Sig Dispense Refill  . acetaminophen (TYLENOL) 500 MG tablet Take 500 mg by mouth every 6 (six) hours as needed.    Marland Kitchen amLODipine (NORVASC) 10 MG tablet Take 1 tablet (10 mg total) by mouth daily. 90 tablet 1  . atorvastatin (LIPITOR) 10 MG tablet Take 1 tablet (10 mg total) by mouth daily. 30 tablet 10  . citalopram (CELEXA) 40 MG tablet Take 40 mg by mouth daily.    . clonazePAM (KLONOPIN) 0.5 MG tablet Take 0.5 mg by mouth 3 (three) times daily as needed. Take 1 tablet every morning, 1 at noon, 1/2 at 4 pm and 1 at bedtime    . Ensure (ENSURE) Take 237 mLs by mouth 2 (two) times daily between meals.    . lactulose (CHRONULAC) 10 GM/15ML solution GIVE 45 ML (30 GM) BY MOUTH 3 TIMES A DAY 1892 mL 10  . loratadine (CLARITIN) 10 MG tablet Take 10 mg by mouth daily.    . montelukast (SINGULAIR) 10 MG tablet Take 1 tablet (10 mg total) by mouth daily. 90 tablet 1  . Multiple Vitamin (MULTIVITAMIN) tablet Take 1 tablet by mouth daily. 90 tablet 0  . pantoprazole (PROTONIX) 40 MG tablet Take 1 tablet (40 mg total) by mouth 2 (two) times daily. Take twice daily 180 tablet 1  . potassium chloride (MICRO-K) 10 MEQ CR capsule Take 10 mEq  by mouth daily.    . psyllium (REGULOID) 0.52 g capsule Take 28.3 g by mouth.    . QUEtiapine (SEROQUEL) 200 MG tablet Take 200 mg by mouth. Take 1 every morning, 1 1/2 at noon , 1/2 at 4 pm and 1 at bedtime    . Starch, Thickening, LIQD 3 tsps per 4 oz of liquid. Recommend 12 oz liquid (water,tea, or juice) three times a day. 237 mL 11  . valsartan (DIOVAN) 40 MG tablet Take 1 tablet (40 mg total) by mouth daily. Take 1 daily 30 tablet 10   No current facility-administered medications on file prior to visit.   Past Medical History:  Diagnosis Date  . Essential hypertension   . GERD without esophagitis   . Mixed hyperlipidemia   . Prediabetes   . Seizure disorder ([place])   . Severe intellectual disabilities     Social History   Socioeconomic History  . Marital status: Single    Spouse name: Not on file  . Number of children: Not on file  . Years of education: Not on file  . Highest education level: Not on file  Occupational History  . Not on file  Tobacco Use  . Smoking status: Never Smoker  . Smokeless tobacco: Never Used  Substance and Sexual Activity  . Alcohol use: Never  . Drug use: Never  .  Sexual activity: Not on file  Other Topics Concern  . Not on file  Social History Narrative  . Not on file   Social Determinants of Health   Financial Resource Strain:   . Difficulty of Paying Living Expenses: Not on file  Food Insecurity:   . Worried About Charity fundraiser in the Last Year: Not on file  . Ran Out of Food in the Last Year: Not on file  Transportation Needs:   . Lack of Transportation (Medical): Not on file  . Lack of Transportation (Non-Medical): Not on file  Physical Activity:   . Days of Exercise per Week: Not on file  . Minutes of Exercise per Session: Not on file  Stress:   . Feeling of Stress : Not on file  Social Connections:   . Frequency of Communication with Friends and Family: Not on file  . Frequency of Social Gatherings with Friends and  Family: Not on file  . Attends Religious Services: Not on file  . Active Member of Clubs or Organizations: Not on file  . Attends Archivist Meetings: Not on file  . Marital Status: Not on file    Review of Systems  Unable to perform ROS: Patient nonverbal     Objective:  BP 102/68 (BP Location: Left Arm, Patient Position: Sitting)   Pulse (!) 111   Temp (!) 97.4 F (36.3 C) (Temporal)   Ht 5\' 9"  (1.753 m)   Wt 197 lb (89.4 kg)   SpO2 96%   BMI 29.09 kg/m   BP/Weight 07/02/2020 06/25/2020 41/03/3789  Systolic BP 240 973 532  Diastolic BP 68 90 72  Wt. (Lbs) 197 199 216  BMI 29.09 29.39 31.9    Physical Exam Constitutional:      Appearance: Normal appearance.  HENT:     Head: Normocephalic.     Right Ear: There is impacted cerumen.     Left Ear: There is impacted cerumen.     Nose: Nose normal.  Eyes:     Pupils: Pupils are equal, round, and reactive to light.  Cardiovascular:     Rate and Rhythm: Regular rhythm. Tachycardia present.     Pulses: Normal pulses.     Heart sounds: Normal heart sounds.  Pulmonary:     Effort: Pulmonary effort is normal.     Comments: Breath sound diminished in all lobes Abdominal:     Palpations: Abdomen is soft.     Comments: Hypoactive bowel sounds in all four quadrants  Musculoskeletal:     Cervical back: Normal range of motion.  Skin:    General: Skin is warm and dry.     Capillary Refill: Capillary refill takes less than 2 seconds.  Neurological:     Mental Status: He is alert. Mental status is at baseline.  Psychiatric:     Comments: Pt appears agitated; humming, facial grimacing noted            Lab Results  Component Value Date   WBC 9.8 05/30/2020   HGB 14.3 05/30/2020   HCT 43.4 05/30/2020   PLT 303 05/30/2020   GLUCOSE 104 (H) 06/25/2020   CHOL 182 05/30/2020   TRIG 206 (H) 05/30/2020   HDL 30 (L) 05/30/2020   LDLCALC 116 (H) 05/30/2020   ALT 26 06/25/2020   AST 25 06/25/2020   NA 137  06/25/2020   K 4.9 06/25/2020   CL 100 06/25/2020   CREATININE 0.82 06/25/2020   BUN 12 06/25/2020  CO2 24 06/25/2020   TSH 1.160 11/01/2019   INR 1.1 12/01/2008   HGBA1C 6.0 (H) 05/30/2020      Assessment & Plan:   1. Impacted cerumen of both ears -Removal of cerumen from bilateral ears  2. Seizure disorder ([place]) -continue Klonipin TID PRN  3. Autism disorder  4. Aphasia  5. Essential hypertension-well controlled -Continue Diovan and Norvasc daily  6. History of aspiration pneumonia -Continue thickener to foods and beverages  7. History of respiratory failure -chest x-ray -monitor pulse oximeter daily  8. Tachycardia -chest x-ray   Follow-up: Return in about 3 months (around 09/30/2020) Has f/u 09/28/20 with Dr Tobie Poet.  An After Visit Summary was printed and given to the patient.  Rip Harbour, NP [place] [phone]

## 2020-07-03 ENCOUNTER — Other Ambulatory Visit: Payer: Self-pay | Admitting: Nurse Practitioner

## 2020-07-03 DIAGNOSIS — K5939 Other megacolon: Secondary | ICD-10-CM | POA: Diagnosis not present

## 2020-07-03 DIAGNOSIS — I517 Cardiomegaly: Secondary | ICD-10-CM | POA: Diagnosis not present

## 2020-07-03 DIAGNOSIS — R0989 Other specified symptoms and signs involving the circulatory and respiratory systems: Secondary | ICD-10-CM | POA: Diagnosis not present

## 2020-07-03 DIAGNOSIS — K562 Volvulus: Secondary | ICD-10-CM

## 2020-07-03 DIAGNOSIS — Z8719 Personal history of other diseases of the digestive system: Secondary | ICD-10-CM | POA: Diagnosis not present

## 2020-07-03 DIAGNOSIS — R194 Change in bowel habit: Secondary | ICD-10-CM | POA: Diagnosis not present

## 2020-07-05 ENCOUNTER — Ambulatory Visit (INDEPENDENT_AMBULATORY_CARE_PROVIDER_SITE_OTHER): Payer: Medicare Other | Admitting: Nurse Practitioner

## 2020-07-05 ENCOUNTER — Other Ambulatory Visit: Payer: Self-pay

## 2020-07-05 DIAGNOSIS — Q433 Congenital malformations of intestinal fixation: Secondary | ICD-10-CM | POA: Diagnosis not present

## 2020-07-05 DIAGNOSIS — R8290 Unspecified abnormal findings in urine: Secondary | ICD-10-CM | POA: Diagnosis not present

## 2020-07-05 DIAGNOSIS — K5909 Other constipation: Secondary | ICD-10-CM

## 2020-07-05 DIAGNOSIS — R1084 Generalized abdominal pain: Secondary | ICD-10-CM | POA: Diagnosis not present

## 2020-07-05 DIAGNOSIS — K7689 Other specified diseases of liver: Secondary | ICD-10-CM | POA: Diagnosis not present

## 2020-07-05 DIAGNOSIS — K429 Umbilical hernia without obstruction or gangrene: Secondary | ICD-10-CM | POA: Diagnosis not present

## 2020-07-09 ENCOUNTER — Other Ambulatory Visit: Payer: Self-pay | Admitting: Nurse Practitioner

## 2020-07-09 ENCOUNTER — Ambulatory Visit (INDEPENDENT_AMBULATORY_CARE_PROVIDER_SITE_OTHER): Payer: Medicare Other

## 2020-07-09 DIAGNOSIS — J690 Pneumonitis due to inhalation of food and vomit: Secondary | ICD-10-CM | POA: Diagnosis not present

## 2020-07-09 MED ORDER — CEFUROXIME AXETIL 500 MG PO TABS
500.0000 mg | ORAL_TABLET | Freq: Two times a day (BID) | ORAL | 0 refills | Status: DC
Start: 2020-07-09 — End: 2020-09-28

## 2020-07-10 MED ORDER — CEFTRIAXONE SODIUM 1 G IJ SOLR
1.0000 g | Freq: Once | INTRAMUSCULAR | Status: AC
  Administered 2020-07-09: 11:00:00 1 g via INTRAMUSCULAR

## 2020-07-11 ENCOUNTER — Other Ambulatory Visit: Payer: Self-pay

## 2020-07-11 ENCOUNTER — Telehealth: Payer: Self-pay

## 2020-07-11 ENCOUNTER — Other Ambulatory Visit: Payer: Self-pay | Admitting: Family Medicine

## 2020-07-11 MED ORDER — PSYLLIUM 58.6 % PO PACK: 1.0000 | PACK | Freq: Every day | ORAL | 12 refills | Status: DC

## 2020-07-11 NOTE — Telephone Encounter (Signed)
Metamucil sent. Similar, but different brand. Hopefully this will go through. Kc

## 2020-07-11 NOTE — Telephone Encounter (Signed)
The Home Health Nurse called to report that [name] is clinically much improved since his rocephin injection and beginning the oral medication.  His color is improved and he is moaning less and more active again.

## 2020-07-11 NOTE — Telephone Encounter (Signed)
Pharmacy called statig that reguloid is on backorder until February, they are requesting we discontinue the order for reguloid and rx something different.

## 2020-07-12 NOTE — Telephone Encounter (Signed)
Letter to d/c reguloid and to start metamucil has been faxed.

## 2020-07-24 ENCOUNTER — Other Ambulatory Visit: Payer: Self-pay

## 2020-07-24 DIAGNOSIS — J690 Pneumonitis due to inhalation of food and vomit: Secondary | ICD-10-CM

## 2020-07-31 ENCOUNTER — Ambulatory Visit (INDEPENDENT_AMBULATORY_CARE_PROVIDER_SITE_OTHER): Payer: Medicare Other

## 2020-07-31 DIAGNOSIS — Z20822 Contact with and (suspected) exposure to covid-19: Secondary | ICD-10-CM

## 2020-07-31 LAB — POC COVID19 BINAXNOW: SARS Coronavirus 2 Ag: POSITIVE — AB

## 2020-08-03 ENCOUNTER — Telehealth: Payer: Self-pay

## 2020-08-03 NOTE — Telephone Encounter (Signed)
[name] called to report that she was out to visit [name] today.  He tested positive for covid on Tuesday but she is doing well with no shortness of breath. Lungs were clear, and she reports that he is eating and drinking very well.  The nurse wanted approval for an extra visit next week with [name].  Approval was given for an additional visit.

## 2020-09-27 NOTE — Progress Notes (Signed)
Subjective:  Patient ID: [name], male    DOB: 03-03-1961  Age: 60 y.o. MRN: [id number]  Chief Complaint  Patient presents with  . Hypertension  . Hyperlipidemia    HPI  Essential hypertension Takes norvasc 10 mg once daily and valsartan 40 mg once daily.  GERD without esophagitis Taking protonix 40 mg one twice daily.  Mixed hyperlipidemia Eats fairly healthy. He eats meals he is given at group home.  Taking lipitor 10 mg once daily.  Prediabetes Eats fairly healthy. He eats meals he is given at group home.   Depression, major, recurrent, mild (HCC)  Controlled with celexa 40 mg daily, quetiapine, and clonazepam 0.5 mg one three times a day prn. Sees Dr. Bryan Lemma.  Constipation Good appetite, BM's good. He is no longer sitting for long time periods/rocking on toilet. No pain  Current Outpatient Medications on File Prior to Visit  Medication Sig Dispense Refill  . acetaminophen (TYLENOL) 500 MG tablet Take 500 mg by mouth every 6 (six) hours as needed.    Marland Kitchen amLODipine (NORVASC) 10 MG tablet Take 1 tablet (10 mg total) by mouth daily. 90 tablet 1  . atorvastatin (LIPITOR) 10 MG tablet Take 1 tablet (10 mg total) by mouth daily. 30 tablet 10  . citalopram (CELEXA) 40 MG tablet Take 40 mg by mouth daily.    . clonazePAM (KLONOPIN) 0.5 MG tablet Take 0.5 mg by mouth 3 (three) times daily as needed. Take 1 tablet every morning, 1 at noon, 1/2 at 4 pm and 1 at bedtime    . Ensure (ENSURE) Take 237 mLs by mouth 2 (two) times daily between meals.    . lactulose (CHRONULAC) 10 GM/15ML solution GIVE 45 ML (30 GM) BY MOUTH 3 TIMES A DAY 1892 mL 10  . loratadine (CLARITIN) 10 MG tablet Take 10 mg by mouth daily.    . montelukast (SINGULAIR) 10 MG tablet Take 1 tablet (10 mg total) by mouth daily. 90 tablet 1  . Multiple Vitamin (MULTIVITAMIN) tablet Take 1 tablet by mouth daily. 90 tablet 0  . pantoprazole (PROTONIX) 40 MG tablet Take 1 tablet (40 mg total) by mouth 2 (two)  times daily. Take twice daily 180 tablet 1  . potassium chloride (MICRO-K) 10 MEQ CR capsule Take 10 mEq by mouth daily.    . psyllium (METAMUCIL) 58.6 % packet Take 1 packet by mouth daily. 30 each 12  . QUEtiapine (SEROQUEL) 200 MG tablet Take 200 mg by mouth. Take 1 every morning, 1 1/2 at noon , 1/2 at 4 pm and 1 at bedtime    . Starch, Thickening, LIQD 3 tsps per 4 oz of liquid. Recommend 12 oz liquid (water,tea, or juice) three times a day. 237 mL 11  . valsartan (DIOVAN) 40 MG tablet Take 1 tablet (40 mg total) by mouth daily. Take 1 daily 30 tablet 10   No current facility-administered medications on file prior to visit.   Past Medical History:  Diagnosis Date  . Essential hypertension   . GERD without esophagitis   . Mixed hyperlipidemia   . Prediabetes   . Seizure disorder ([place])   . Severe intellectual disabilities    No past surgical history on file.  No family history on file. Social History   Socioeconomic History  . Marital status: Single    Spouse name: Not on file  . Number of children: Not on file  . Years of education: Not on file  . Highest education level: Not  on file  Occupational History  . Not on file  Tobacco Use  . Smoking status: Never Smoker  . Smokeless tobacco: Never Used  Substance and Sexual Activity  . Alcohol use: Never  . Drug use: Never  . Sexual activity: Not on file  Other Topics Concern  . Not on file  Social History Narrative  . Not on file   Social Determinants of Health   Financial Resource Strain: Not on file  Food Insecurity: Not on file  Transportation Needs: Not on file  Physical Activity: Not on file  Stress: Not on file  Social Connections: Not on file    Review of Systems  Constitutional: Negative for chills, diaphoresis, fatigue and fever.  HENT: Negative for congestion, ear pain, rhinorrhea and sore throat.   Respiratory: Negative for cough and shortness of breath.   Cardiovascular: Negative for chest pain,  palpitations and leg swelling.  Gastrointestinal: Negative for abdominal pain and constipation.     Objective:  BP 120/60   Pulse 72   Temp (!) 97.3 F (36.3 C)   Resp 16   Ht 5\' 9"  (1.753 m)   Wt 204 lb (92.5 kg)   BMI 30.13 kg/m   BP/Weight 09/28/2020 07/02/2020 76/81/1572  Systolic BP 620 355 974  Diastolic BP 60 68 90  Wt. (Lbs) 204 197 199  BMI 30.13 29.09 29.39    Physical Exam Vitals reviewed.  Constitutional:      Appearance: Normal appearance. He is normal weight.     Comments: Non-verbal  Cardiovascular:     Rate and Rhythm: Normal rate and regular rhythm.     Heart sounds: No murmur heard.   Pulmonary:     Effort: Pulmonary effort is normal.     Breath sounds: Normal breath sounds.  Abdominal:     General: Abdomen is flat. Bowel sounds are normal.     Palpations: Abdomen is soft.     Tenderness: There is no abdominal tenderness.  Musculoskeletal:     Comments: Large scar midline, healing well  Neurological:     Mental Status: He is alert and oriented to person, place, and time.  Psychiatric:        Mood and Affect: Mood normal.        Behavior: Behavior normal.     Diabetic Foot Exam - Simple   No data filed      Lab Results  Component Value Date   WBC 9.8 05/30/2020   HGB 14.3 05/30/2020   HCT 43.4 05/30/2020   PLT 303 05/30/2020   GLUCOSE 104 (H) 06/25/2020   CHOL 182 05/30/2020   TRIG 206 (H) 05/30/2020   HDL 30 (L) 05/30/2020   LDLCALC 116 (H) 05/30/2020   ALT 26 06/25/2020   AST 25 06/25/2020   NA 137 06/25/2020   K 4.9 06/25/2020   CL 100 06/25/2020   CREATININE 0.82 06/25/2020   BUN 12 06/25/2020   CO2 24 06/25/2020   TSH 1.160 11/01/2019   INR 1.1 12/01/2008   HGBA1C 6.0 (H) 05/30/2020      Assessment & Plan:   1. Essential hypertension Well controlled.  No changes to medicines.  Continue a healthy diet and exercise.  - Comprehensive metabolic panel; Future  2. GERD without esophagitis The current medical  regimen is effective;  continue present plan and medications.  3. Mixed hyperlipidemia Well controlled.  No changes to medicines.  Continue eating a healthy diet and exercise.  - Lipid panel; Future  4.  Prediabetes - Hemoglobin A1c; Future - CBC with Differential/Platelet; Future  5. Depression, major, recurrent, mild ([place]) The current medical regimen is effective;  continue present plan and medications. Management per specialist (Dr. Missy Sabins.)   6. Mentally disabled 7. Autism   Orders Placed This Encounter  Procedures  . Lipid panel  . Hemoglobin A1c  . CBC with Differential/Platelet  . Comprehensive metabolic panel    Follow-up: Return in about 6 months (around 03/31/2021) for fasting.  An After Visit Summary was printed and given to the patient.  Rochel Brome, MD [facility] [phone]

## 2020-09-28 ENCOUNTER — Other Ambulatory Visit: Payer: Self-pay

## 2020-09-28 ENCOUNTER — Ambulatory Visit (INDEPENDENT_AMBULATORY_CARE_PROVIDER_SITE_OTHER): Payer: Medicare Other | Admitting: Family Medicine

## 2020-09-28 VITALS — BP 120/60 | HR 72 | Temp 97.3°F | Resp 16 | Ht 69.0 in | Wt 204.0 lb

## 2020-09-28 DIAGNOSIS — E782 Mixed hyperlipidemia: Secondary | ICD-10-CM | POA: Diagnosis not present

## 2020-09-28 DIAGNOSIS — F840 Autistic disorder: Secondary | ICD-10-CM

## 2020-09-28 DIAGNOSIS — K219 Gastro-esophageal reflux disease without esophagitis: Secondary | ICD-10-CM | POA: Diagnosis not present

## 2020-09-28 DIAGNOSIS — I10 Essential (primary) hypertension: Secondary | ICD-10-CM

## 2020-09-28 DIAGNOSIS — F330 Major depressive disorder, recurrent, mild: Secondary | ICD-10-CM

## 2020-09-28 DIAGNOSIS — R7303 Prediabetes: Secondary | ICD-10-CM

## 2020-09-28 DIAGNOSIS — F79 Unspecified intellectual disabilities: Secondary | ICD-10-CM

## 2020-09-29 ENCOUNTER — Encounter: Payer: Self-pay | Admitting: Family Medicine

## 2020-10-01 ENCOUNTER — Other Ambulatory Visit: Payer: Self-pay

## 2020-10-01 ENCOUNTER — Other Ambulatory Visit: Payer: Medicare Other

## 2020-10-01 DIAGNOSIS — I10 Essential (primary) hypertension: Secondary | ICD-10-CM

## 2020-10-01 DIAGNOSIS — E782 Mixed hyperlipidemia: Secondary | ICD-10-CM

## 2020-10-01 DIAGNOSIS — R7303 Prediabetes: Secondary | ICD-10-CM

## 2020-10-02 LAB — HEMOGLOBIN A1C
Est. average glucose Bld gHb Est-mCnc: 120 mg/dL
Hgb A1c MFr Bld: 5.8 % — ABNORMAL HIGH (ref 4.8–5.6)

## 2020-10-02 LAB — LIPID PANEL
Chol/HDL Ratio: 5.9 ratio — ABNORMAL HIGH (ref 0.0–5.0)
Cholesterol, Total: 201 mg/dL — ABNORMAL HIGH (ref 100–199)
HDL: 34 mg/dL — ABNORMAL LOW (ref >39)
LDL Chol Calc (NIH): 142 mg/dL — ABNORMAL HIGH (ref 0–99)
Triglycerides: 136 mg/dL (ref 0–149)
VLDL Cholesterol Cal: 25 mg/dL (ref 5–40)

## 2020-10-02 LAB — CBC WITH DIFFERENTIAL/PLATELET
Basophils Absolute: 0.1 10*3/uL (ref 0.0–0.2)
Basos: 0 %
EOS (ABSOLUTE): 0.3 10*3/uL (ref 0.0–0.4)
Eos: 2 %
Hematocrit: 45.4 % (ref 37.5–51.0)
Hemoglobin: 14.6 g/dL (ref 13.0–17.7)
Immature Grans (Abs): 0 10*3/uL (ref 0.0–0.1)
Immature Granulocytes: 0 %
Lymphocytes Absolute: 2 10*3/uL (ref 0.7–3.1)
Lymphs: 17 %
MCH: 27.8 pg (ref 26.6–33.0)
MCHC: 32.2 g/dL (ref 31.5–35.7)
MCV: 87 fL (ref 79–97)
Monocytes Absolute: 0.9 10*3/uL (ref 0.1–0.9)
Monocytes: 8 %
Neutrophils Absolute: 8.3 10*3/uL — ABNORMAL HIGH (ref 1.4–7.0)
Neutrophils: 73 %
Platelets: 339 10*3/uL (ref 150–450)
RBC: 5.25 x10E6/uL (ref 4.14–5.80)
RDW: 14 % (ref 11.6–15.4)
WBC: 11.4 10*3/uL — ABNORMAL HIGH (ref 3.4–10.8)

## 2020-10-02 LAB — COMPREHENSIVE METABOLIC PANEL
ALT: 20 IU/L (ref 0–44)
AST: 20 IU/L (ref 0–40)
Albumin/Globulin Ratio: 1.4 (ref 1.2–2.2)
Albumin: 4.3 g/dL (ref 3.8–4.9)
Alkaline Phosphatase: 126 IU/L — ABNORMAL HIGH (ref 44–121)
BUN/Creatinine Ratio: 18 (ref 9–20)
BUN: 16 mg/dL (ref 6–24)
Bilirubin Total: 0.2 mg/dL (ref 0.0–1.2)
CO2: 19 mmol/L — ABNORMAL LOW (ref 20–29)
Calcium: 9.3 mg/dL (ref 8.7–10.2)
Chloride: 104 mmol/L (ref 96–106)
Creatinine, Ser: 0.87 mg/dL (ref 0.76–1.27)
Globulin, Total: 3 g/dL (ref 1.5–4.5)
Glucose: 91 mg/dL (ref 65–99)
Potassium: 4.4 mmol/L (ref 3.5–5.2)
Sodium: 140 mmol/L (ref 134–144)
Total Protein: 7.3 g/dL (ref 6.0–8.5)
eGFR: 100 mL/min/{1.73_m2} (ref >59)

## 2020-10-02 LAB — CARDIOVASCULAR RISK ASSESSMENT

## 2020-10-03 ENCOUNTER — Other Ambulatory Visit: Payer: Self-pay

## 2020-10-03 MED ORDER — ATORVASTATIN CALCIUM 20 MG PO TABS: 20.0000 mg | ORAL_TABLET | Freq: Every day | ORAL | 2 refills | Status: DC

## 2020-10-04 ENCOUNTER — Other Ambulatory Visit: Payer: Self-pay | Admitting: Family Medicine

## 2020-10-04 DIAGNOSIS — K219 Gastro-esophageal reflux disease without esophagitis: Secondary | ICD-10-CM

## 2020-10-04 DIAGNOSIS — I10 Essential (primary) hypertension: Secondary | ICD-10-CM

## 2020-10-26 ENCOUNTER — Other Ambulatory Visit: Payer: Self-pay

## 2020-10-26 MED ORDER — LACTULOSE 10 GM/15ML PO SOLN: ORAL | 10 refills | Status: DC

## 2020-11-05 ENCOUNTER — Other Ambulatory Visit: Payer: Self-pay | Admitting: Physician Assistant

## 2020-11-05 DIAGNOSIS — F79 Unspecified intellectual disabilities: Secondary | ICD-10-CM

## 2020-11-29 ENCOUNTER — Encounter: Payer: Self-pay | Admitting: Nurse Practitioner

## 2020-11-29 ENCOUNTER — Other Ambulatory Visit: Payer: Self-pay

## 2020-11-29 ENCOUNTER — Ambulatory Visit (INDEPENDENT_AMBULATORY_CARE_PROVIDER_SITE_OTHER): Payer: Medicare Other | Admitting: Nurse Practitioner

## 2020-11-29 VITALS — BP 122/68 | HR 84 | Temp 96.8°F | Ht 69.0 in | Wt 205.0 lb

## 2020-11-29 DIAGNOSIS — F840 Autistic disorder: Secondary | ICD-10-CM

## 2020-11-29 DIAGNOSIS — F79 Unspecified intellectual disabilities: Secondary | ICD-10-CM | POA: Diagnosis not present

## 2020-11-29 DIAGNOSIS — W19XXXS Unspecified fall, sequela: Secondary | ICD-10-CM | POA: Diagnosis not present

## 2020-11-29 DIAGNOSIS — R2689 Other abnormalities of gait and mobility: Secondary | ICD-10-CM | POA: Diagnosis not present

## 2020-11-29 DIAGNOSIS — F72 Severe intellectual disabilities: Secondary | ICD-10-CM

## 2020-11-29 NOTE — Patient Instructions (Signed)
We will contact you with physical therapy Follow-up as needed  Fall Prevention in the Home, Adult Falls can cause injuries and can happen to people of all ages. There are many things you can do to make your home safe and to help prevent falls. Ask for help when making these changes. What actions can I take to prevent falls? General Instructions  Use good lighting in all rooms. Replace any light bulbs that burn out.  Turn on the lights in dark areas. Use night-lights.  Keep items that you use often in easy-to-reach places. Lower the shelves around your home if needed.  Set up your furniture so you have a clear path. Avoid moving your furniture around.  Do not have throw rugs or other things on the floor that can make you trip.  Avoid walking on wet floors.  If any of your floors are uneven, fix them.  Add color or contrast paint or tape to clearly mark and help you see: ? Grab bars or handrails. ? First and last steps of staircases. ? Where the edge of each step is.  If you use a stepladder: ? Make sure that it is fully opened. Do not climb a closed stepladder. ? Make sure the sides of the stepladder are locked in place. ? Ask someone to hold the stepladder while you use it.  Know where your pets are when moving through your home. What can I do in the bathroom?  Keep the floor dry. Clean up any water on the floor right away.  Remove soap buildup in the tub or shower.  Use nonskid mats or decals on the floor of the tub or shower.  Attach bath mats securely with double-sided, nonslip rug tape.  If you need to sit down in the shower, use a plastic, nonslip stool.  Install grab bars by the toilet and in the tub and shower. Do not use towel bars as grab bars.      What can I do in the bedroom?  Make sure that you have a light by your bed that is easy to reach.  Do not use any sheets or blankets for your bed that hang to the floor.  Have a firm chair with side arms that  you can use for support when you get dressed. What can I do in the kitchen?  Clean up any spills right away.  If you need to reach something above you, use a step stool with a grab bar.  Keep electrical cords out of the way.  Do not use floor polish or wax that makes floors slippery. What can I do with my stairs?  Do not leave any items on the stairs.  Make sure that you have a light switch at the top and the bottom of the stairs.  Make sure that there are handrails on both sides of the stairs. Fix handrails that are broken or loose.  Install nonslip stair treads on all your stairs.  Avoid having throw rugs at the top or bottom of the stairs.  Choose a carpet that does not hide the edge of the steps on the stairs.  Check carpeting to make sure that it is firmly attached to the stairs. Fix carpet that is loose or worn. What can I do on the outside of my home?  Use bright outdoor lighting.  Fix the edges of walkways and driveways and fix any cracks.  Remove anything that might make you trip as you walk through a  door, such as a raised step or threshold.  Trim any bushes or trees on paths to your home.  Check to see if handrails are loose or broken and that both sides of all steps have handrails.  Install guardrails along the edges of any raised decks and porches.  Clear paths of anything that can make you trip, such as tools or rocks.  Have leaves, snow, or ice cleared regularly.  Use sand or salt on paths during winter.  Clean up any spills in your garage right away. This includes grease or oil spills. What other actions can I take?  Wear shoes that: ? Have a low heel. Do not wear high heels. ? Have rubber bottoms. ? Feel good on your feet and fit well. ? Are closed at the toe. Do not wear open-toe sandals.  Use tools that help you move around if needed. These include: ? Canes. ? Walkers. ? Scooters. ? Crutches.  Review your medicines with your doctor. Some  medicines can make you feel dizzy. This can increase your chance of falling. Ask your doctor what else you can do to help prevent falls. Where to find more information  Centers for Disease Control and Prevention, STEADI: http://www.wolf.info/  National Institute on Aging: http://kim-miller.com/ Contact a doctor if:  You are afraid of falling at home.  You feel weak, drowsy, or dizzy at home.  You fall at home. Summary  There are many simple things that you can do to make your home safe and to help prevent falls.  Ways to make your home safe include removing things that can make you trip and installing grab bars in the bathroom.  Ask for help when making these changes in your home. This information is not intended to replace advice given to you by your health care provider. Make sure you discuss any questions you have with your health care provider. Document Revised: 02/15/2020 Document Reviewed: 02/15/2020 Elsevier Patient Education  [place].

## 2020-11-29 NOTE — Progress Notes (Signed)
Established Patient Office Visit  Subjective:  Patient ID: [name], male    DOB: 1961-03-19  Age: 60 y.o. MRN: [id number]  CC: Hospital follow-up s/p fall   HPI [name] presents for hospital follow-up status post fall on 11/16/20. He is accompanied by staff member from group home, Tammy, who helps supplement history. [name] has autism, non-verbal, with intellectual disabilities. Tammy states that [name] sustained an abrasion to left forehead and left knee after falling onto his face in gravel while stepping out of activity van. ED medical record review reveals negative findings on head and cervical spine CT. Tammy denies recurrent falls at group home but states he has experienced "close calls". She states he is able to follow commands with encouragement. Denies any recent illnesses, changes to appetite/intake, or physical activity.   Past Medical History:  Diagnosis Date  . Essential hypertension   . GERD without esophagitis   . Mixed hyperlipidemia   . Prediabetes   . Seizure disorder ([place])   . Severe intellectual disabilities       Social History   Socioeconomic History  . Marital status: Single    Spouse name: Not on file  . Number of children: Not on file  . Years of education: Not on file  . Highest education level: Not on file  Occupational History  . Not on file  Tobacco Use  . Smoking status: Never Smoker  . Smokeless tobacco: Never Used  Substance and Sexual Activity  . Alcohol use: Never  . Drug use: Never  . Sexual activity: Not on file  Other Topics Concern  . Not on file  Social History Narrative  . Not on file   Social Determinants of Health   Financial Resource Strain: Not on file  Food Insecurity: Not on file  Transportation Needs: Not on file  Physical Activity: Not on file  Stress: Not on file  Social Connections: Not on file  Intimate Partner Violence: Not on file    Outpatient Medications Prior to Visit  Medication Sig  Dispense Refill  . acetaminophen (TYLENOL) 500 MG tablet Take 500 mg by mouth every 6 (six) hours as needed.    Marland Kitchen amLODipine (NORVASC) 10 MG tablet TAKE 1 TABLET BY MOUTH EVERY DAY 30 tablet 2  . atorvastatin (LIPITOR) 20 MG tablet Take 1 tablet (20 mg total) by mouth daily. 30 tablet 2  . citalopram (CELEXA) 40 MG tablet Take 40 mg by mouth daily.    . clonazePAM (KLONOPIN) 0.5 MG tablet Take 0.5 mg by mouth 3 (three) times daily as needed. Take 1 tablet every morning, 1 at noon, 1/2 at 4 pm and 1 at bedtime    . Ensure (ENSURE) Take 237 mLs by mouth 2 (two) times daily between meals.    . lactulose (CHRONULAC) 10 GM/15ML solution GIVE 45 ML (30 GM) BY MOUTH 3 TIMES A DAY 1892 mL 10  . loratadine (CLARITIN) 10 MG tablet Take 10 mg by mouth daily.    . montelukast (SINGULAIR) 10 MG tablet TAKE 1 TABLET BY MOUTH EVERY DAY 30 tablet 11  . Multiple Vitamin (MULTIVITAMIN) tablet Take 1 tablet by mouth daily. 90 tablet 0  . pantoprazole (PROTONIX) 40 MG tablet TAKE 1 TABLET BY MOUTH TWICE A DAY 60 tablet 2  . potassium chloride (MICRO-K) 10 MEQ CR capsule Take 10 mEq by mouth daily.    . psyllium (METAMUCIL) 58.6 % packet Take 1 packet by mouth daily. 30 each 12  .  QUEtiapine (SEROQUEL) 200 MG tablet Take 200 mg by mouth. Take 1 every morning, 1 1/2 at noon , 1/2 at 4 pm and 1 at bedtime    . Starch, Thickening, LIQD 3 tsps per 4 oz of liquid. Recommend 12 oz liquid (water,tea, or juice) three times a day. 237 mL 11  . valsartan (DIOVAN) 40 MG tablet Take 1 tablet (40 mg total) by mouth daily. Take 1 daily 30 tablet 10   No facility-administered medications prior to visit.    Allergies  Allergen Reactions  . Augmentin [Amoxicillin-Pot Clavulanate]     ROS Review of Systems  Unable to perform ROS: Patient nonverbal  Skin: Positive for wound (abrasion left knee, forehead per group home staff member).      Objective:    Physical Exam Vitals reviewed.  Constitutional:      Appearance:  Normal appearance.  HENT:     Head: Normocephalic.     Right Ear: Tympanic membrane normal.     Left Ear: Tympanic membrane normal.  Cardiovascular:     Rate and Rhythm: Normal rate and regular rhythm.     Pulses: Normal pulses.     Heart sounds: Normal heart sounds.  Pulmonary:     Effort: Pulmonary effort is normal.     Breath sounds: Normal breath sounds.  Abdominal:     General: Bowel sounds are normal.     Palpations: Abdomen is soft.  Skin:    General: Skin is warm and dry.     Capillary Refill: Capillary refill takes less than 2 seconds.     Comments: Healing abrasions left forehead and left knee  Neurological:     Mental Status: He is alert. Mental status is at baseline.  Psychiatric:        Behavior: Behavior normal.   1. Fall, sequela - Ambulatory referral to Physical Therapy  2. Balance problem - Ambulatory referral to Physical Therapy  Wt Readings from Last 3 Encounters:  09/28/20 204 lb (92.5 kg)  07/02/20 197 lb (89.4 kg)  06/25/20 199 lb (90.3 kg)   BP 122/68 (BP Location: Left Arm, Patient Position: Sitting)   Pulse 84   Temp (!) 96.8 F (36 C) (Temporal)   Ht '5\' 9"'  (1.753 m)   Wt 205 lb (93 kg)   SpO2 98%   BMI 30.27 kg/m   Health Maintenance Due  Topic Date Due  . Hepatitis C Screening  Never done  . COVID-19 Vaccine (1) Never done  . HIV Screening  Never done  . TETANUS/TDAP  Never done    Lab Results  Component Value Date   TSH 1.160 11/01/2019   Lab Results  Component Value Date   WBC 11.4 (H) 10/01/2020   HGB 14.6 10/01/2020   HCT 45.4 10/01/2020   MCV 87 10/01/2020   PLT 339 10/01/2020   Lab Results  Component Value Date   NA 140 10/01/2020   K 4.4 10/01/2020   CO2 19 (L) 10/01/2020   GLUCOSE 91 10/01/2020   BUN 16 10/01/2020   CREATININE 0.87 10/01/2020   BILITOT 0.2 10/01/2020   ALKPHOS 126 (H) 10/01/2020   AST 20 10/01/2020   ALT 20 10/01/2020   PROT 7.3 10/01/2020   ALBUMIN 4.3 10/01/2020   CALCIUM 9.3  10/01/2020   EGFR 100 10/01/2020   Lab Results  Component Value Date   CHOL 201 (H) 10/01/2020   Lab Results  Component Value Date   HDL 34 (L) 10/01/2020   Lab Results  Component Value Date   LDLCALC 142 (H) 10/01/2020   Lab Results  Component Value Date   TRIG 136 10/01/2020   Lab Results  Component Value Date   CHOLHDL 5.9 (H) 10/01/2020   Lab Results  Component Value Date   HGBA1C 5.8 (H) 10/01/2020      Assessment & Plan:   1. Fall, sequela - Ambulatory referral to Physical Therapy  2. Balance problem - Ambulatory referral to Physical Therapy  3. Autism -Continue medications -Continue care management at group home  4. Mentally disabled -Continue medications -Continue care management at group home  5. Severe intellectual disabilities -continue medication -continue care management at group home  We will contact you with physical therapy Follow-up as needed   Follow-up: As needed   Signed, Rip Harbour, NP

## 2020-12-03 ENCOUNTER — Other Ambulatory Visit: Payer: Self-pay

## 2020-12-03 MED ORDER — ATORVASTATIN CALCIUM 20 MG PO TABS: 20.0000 mg | ORAL_TABLET | Freq: Every day | ORAL | 2 refills | Status: DC

## 2020-12-06 ENCOUNTER — Other Ambulatory Visit: Payer: Self-pay

## 2020-12-06 DIAGNOSIS — F330 Major depressive disorder, recurrent, mild: Secondary | ICD-10-CM

## 2020-12-06 MED ORDER — CLONAZEPAM 0.5 MG PO TABS: 0.5000 mg | ORAL_TABLET | Freq: Three times a day (TID) | ORAL | 0 refills | Status: DC | PRN

## 2020-12-07 ENCOUNTER — Other Ambulatory Visit: Payer: Self-pay

## 2020-12-07 DIAGNOSIS — F330 Major depressive disorder, recurrent, mild: Secondary | ICD-10-CM

## 2020-12-07 NOTE — Telephone Encounter (Signed)
Pharmacy calling due to confusion. Please fix sig and double check dosage of clonazepam. Pended medication to resend after changes.   Royce Macadamia, [place] 12/07/20 11:01 AM

## 2020-12-10 ENCOUNTER — Telehealth: Payer: Self-pay

## 2020-12-10 ENCOUNTER — Other Ambulatory Visit: Payer: Self-pay | Admitting: Family Medicine

## 2020-12-10 DIAGNOSIS — F330 Major depressive disorder, recurrent, mild: Secondary | ICD-10-CM

## 2020-12-10 MED ORDER — CLONAZEPAM 0.5 MG PO TABS: ORAL_TABLET | ORAL | 3 refills | Status: DC

## 2020-12-10 NOTE — Telephone Encounter (Signed)
[facility] calling again. Needs before lunch.   Royce Macadamia, [place] 12/10/20 10:05 AM

## 2020-12-10 NOTE — Telephone Encounter (Signed)
Resent

## 2021-01-03 ENCOUNTER — Other Ambulatory Visit: Payer: Self-pay

## 2021-01-03 ENCOUNTER — Ambulatory Visit: Payer: Medicare Other

## 2021-01-03 DIAGNOSIS — Z79899 Other long term (current) drug therapy: Secondary | ICD-10-CM

## 2021-01-03 NOTE — Progress Notes (Signed)
Patient is here for a nurse visit for labs to be done by [facility].

## 2021-01-04 LAB — HEMOGLOBIN A1C
Est. average glucose Bld gHb Est-mCnc: 123 mg/dL
Hgb A1c MFr Bld: 5.9 % — ABNORMAL HIGH (ref 4.8–5.6)

## 2021-01-04 LAB — LIPID PANEL
Chol/HDL Ratio: 6.4 ratio — ABNORMAL HIGH (ref 0.0–5.0)
Cholesterol, Total: 148 mg/dL (ref 100–199)
HDL: 23 mg/dL — ABNORMAL LOW (ref >39)
LDL Chol Calc (NIH): 97 mg/dL (ref 0–99)
Triglycerides: 155 mg/dL — ABNORMAL HIGH (ref 0–149)
VLDL Cholesterol Cal: 28 mg/dL (ref 5–40)

## 2021-01-04 LAB — CARDIOVASCULAR RISK ASSESSMENT

## 2021-01-31 ENCOUNTER — Other Ambulatory Visit: Payer: Self-pay | Admitting: Family Medicine

## 2021-01-31 DIAGNOSIS — K219 Gastro-esophageal reflux disease without esophagitis: Secondary | ICD-10-CM

## 2021-01-31 DIAGNOSIS — I10 Essential (primary) hypertension: Secondary | ICD-10-CM

## 2021-02-01 ENCOUNTER — Other Ambulatory Visit: Payer: Self-pay

## 2021-02-04 ENCOUNTER — Telehealth: Payer: Self-pay

## 2021-02-04 NOTE — Telephone Encounter (Signed)
Dr from [facility] calling due to pt BMI. Pt BMI was 19. Today pt weighed 211 lbs in their office, putting BMI at 30. Dr questioning if pt should be continuing ensure w/ the weight gain. Please advise.   Also requesting lab results. Faxed to [phone].  [name] 02/04/21 3:18 PM

## 2021-02-04 NOTE — Telephone Encounter (Signed)
[facility] faxed the labs results. Aarush Stukey called [facility] and I let them know that Dr Tobie Poet wants to he stop ensure.

## 2021-03-05 ENCOUNTER — Other Ambulatory Visit: Payer: Self-pay | Admitting: Family Medicine

## 2021-03-21 ENCOUNTER — Telehealth: Payer: Self-pay

## 2021-03-21 NOTE — Telephone Encounter (Signed)
      LM for return call.  Reached out to check on patient after he was discharged from [facility] 03/18/21.  He presented on 03/17/21 with complaints of severe abdominal pain and no BM x 5 days.  He was treated with Lactulose and Miralax with good results.  Findings of microscopic blood in urine - concerning for bladder lesion - recommended follow-up with Dr Nila Nephew  CT with Contrast - scan shows large stool burden throughout the colon - no evidence of obstruction inflammatory process  Recommended increase water intake and add MiraLax.  Shelle Iron, LPN [id number] AM

## 2021-03-21 NOTE — Telephone Encounter (Signed)
      Baldo Ash returned call and stated that [name] has been taking the MiraLax.  She reports that he seems to be having pain when trying to have a BM and states that he is not having "normal bowel movements for him".  Appointment scheduled with Dr Henrene Pastor tomorrow at 11:00 am.  Shelle Iron, LPN [id number] AM

## 2021-03-22 ENCOUNTER — Other Ambulatory Visit: Payer: Self-pay

## 2021-03-22 ENCOUNTER — Ambulatory Visit (INDEPENDENT_AMBULATORY_CARE_PROVIDER_SITE_OTHER): Payer: Medicare Other | Admitting: Legal Medicine

## 2021-03-22 ENCOUNTER — Encounter: Payer: Self-pay | Admitting: Legal Medicine

## 2021-03-22 VITALS — BP 120/60 | HR 92 | Temp 97.2°F | Resp 17 | Ht 69.0 in | Wt 208.0 lb

## 2021-03-22 DIAGNOSIS — K5909 Other constipation: Secondary | ICD-10-CM

## 2021-03-22 MED ORDER — FLEET ENEMA 7-19 GM/118ML RE ENEM: 1.0000 | ENEMA | Freq: Every day | RECTAL | 6 refills | Status: AC | PRN

## 2021-03-22 NOTE — Progress Notes (Signed)
Established Patient Office Visit  Subjective:  Patient ID: [name], male    DOB: 01/28/1961  Age: 60 y.o. MRN: [id number]  CC:  Chief Complaint  Patient presents with   Transitions Of Care    Patient was admitted at [facility] on 03/17/2021 to 03/18/2021,   Abdominal Pain    Patient has not Bowel movement normally for one week.    HPI BRENTT FREAD presents for chronic constipation.  He presents for transition of care and reconciliation of medicines.  He was admitted to [facility] on 03/17/2021 for abdominal pain and found that he was severely constipated.  He has had a chronic problem with this since childhood.  He is on lactulose 89m TID and other  stool sofeners.  He was discharged on 03/18/2021. He had 2 BM since discharge.  Past Medical History:  Diagnosis Date   Autism    Developmental non-verbal disorder    Essential hypertension    GERD without esophagitis    Mixed hyperlipidemia    Prediabetes    [place]    Severe intellectual disabilities     Past Surgical History:  Procedure Laterality Date   LAPAROTOMY  2021    History reviewed. No pertinent family history.  Social History   Socioeconomic History   Marital status: Single    Spouse name: Not on file   Number of children: Not on file   Years of education: Not on file   Highest education level: Not on file  Occupational History   Not on file  Tobacco Use   Smoking status: Never   Smokeless tobacco: Never  Substance and Sexual Activity   Alcohol use: Never   Drug use: Never   Sexual activity: Not on file  Other Topics Concern   Not on file  Social History Narrative   Not on file   Social Determinants of Health   Financial Resource Strain: Not on file  Food Insecurity: Not on file  Transportation Needs: Not on file  Physical Activity: Not on file  Stress: Not on file  Social Connections: Not on file  Intimate Partner Violence: Not on file     Outpatient Medications Prior to Visit  Medication Sig Dispense Refill   acetaminophen (TYLENOL) 500 MG tablet Take 500 mg by mouth every 6 (six) hours as needed.     amLODipine (NORVASC) 10 MG tablet TAKE 1 TABLET BY MOUTH EVERY DAY 30 tablet 11   atorvastatin (LIPITOR) 20 MG tablet TAKE 1 TABLET BY MOUTH ONCE DAILY 30 tablet 3   bisacodyl (DULCOLAX) 10 MG suppository Place 10 mg rectally daily as needed for moderate constipation.     citalopram (CELEXA) 40 MG tablet Take 40 mg by mouth daily.     clonazePAM (KLONOPIN) 0.5 MG tablet Take 1 tablet every morning, 1 at noon, 1/2 at 4 pm and 1 at bedtime 105 tablet 3   docusate sodium (COLACE) 100 MG capsule Take 100 mg by mouth 2 (two) times daily as needed for mild constipation.     Ensure (ENSURE) Take 237 mLs by mouth 2 (two) times daily between meals.     lactulose (CHRONULAC) 10 GM/15ML solution GIVE 45 ML (30 GM) BY MOUTH 3 TIMES A DAY 1892 mL 10   loratadine (CLARITIN) 10 MG tablet Take 10 mg by mouth daily.     montelukast (SINGULAIR) 10 MG tablet TAKE 1 TABLET BY MOUTH EVERY DAY 30 tablet 11   Multiple Vitamin (MULTIVITAMIN) tablet  Take 1 tablet by mouth daily. 90 tablet 0   pantoprazole (PROTONIX) 40 MG tablet TAKE 1 TABLET BY MOUTH TWICE A DAY 60 tablet 11   polyethylene glycol (MIRALAX / GLYCOLAX) 17 g packet Take 17 g by mouth daily.     potassium chloride (MICRO-K) 10 MEQ CR capsule Take 10 mEq by mouth daily.     psyllium (METAMUCIL) 58.6 % packet Take 1 packet by mouth daily. 30 each 12   QUEtiapine (SEROQUEL) 200 MG tablet Take 200 mg by mouth. Take 1 every morning, 1 1/2 at noon , 1/2 at 4 pm and 1 at bedtime     Starch, Thickening, LIQD 3 tsps per 4 oz of liquid. Recommend 12 oz liquid (water,tea, or juice) three times a day. 237 mL 11   valsartan (DIOVAN) 40 MG tablet Take 1 tablet (40 mg total) by mouth daily. Take 1 daily 30 tablet 10   sodium phosphate (FLEET) 7-19 GM/118ML ENEM Place 1 enema rectally daily as needed  for severe constipation. Dose 4.5 OZ Q72 H PRN.     No facility-administered medications prior to visit.    Allergies  Allergen Reactions   Augmentin [Amoxicillin-Pot Clavulanate]     ROS Review of Systems  Constitutional:  Negative for chills, fatigue and fever.  HENT:  Negative for congestion, ear pain and sore throat.   Respiratory:  Negative for cough and shortness of breath.   Cardiovascular:  Negative for chest pain.  Gastrointestinal:  Negative for abdominal pain, constipation, diarrhea, nausea and vomiting.  Endocrine: Negative for polydipsia, polyphagia and polyuria.  Genitourinary:  Negative for dysuria, frequency and scrotal swelling.  Musculoskeletal:  Negative for arthralgias and myalgias.  Neurological:  Negative for dizziness and headaches.  Psychiatric/Behavioral:  Negative for dysphoric mood.        No dysphoria     Objective:    Physical Exam Vitals reviewed.  Constitutional:      Appearance: Normal appearance.  HENT:     Head: Normocephalic.     Right Ear: Tympanic membrane, ear canal and external ear normal.     Left Ear: Tympanic membrane, ear canal and external ear normal.  Eyes:     Extraocular Movements: Extraocular movements intact.     Conjunctiva/sclera: Conjunctivae normal.     Pupils: Pupils are equal, round, and reactive to light.  Cardiovascular:     Rate and Rhythm: Normal rate and regular rhythm.     Pulses: Normal pulses.     Heart sounds: Normal heart sounds. No murmur heard.   No gallop.  Pulmonary:     Effort: Pulmonary effort is normal. No respiratory distress.     Breath sounds: Normal breath sounds. No wheezing.  Abdominal:     General: Abdomen is flat. Bowel sounds are normal. There is no distension.     Palpations: Abdomen is soft.     Tenderness: There is no abdominal tenderness.  Musculoskeletal:        General: Normal range of motion.     Cervical back: Normal range of motion and neck supple.  Skin:    General: Skin  is warm.     Capillary Refill: Capillary refill takes less than 2 seconds.  Neurological:     General: No focal deficit present.     Mental Status: He is alert and oriented to person, place, and time. Mental status is at baseline.    BP 120/60   Pulse 92   Temp (!) 97.2 F (36.2 C)  Resp 17   Ht _0  (1.753 m)   Wt 208 lb (94.3 kg)   SpO2 96%   BMI 30.72 kg/m  Wt Readings from Last 3 Encounters:  03/22/21 208 lb (94.3 kg)  11/29/20 205 lb (93 kg)  09/28/20 204 lb (92.5 kg)     Health Maintenance Due  Topic Date Due   COVID-19 Vaccine (1) Never done   Pneumococcal Vaccine 54-87 Years old (1 - PCV) Never done   HIV Screening  Never done   Hepatitis C Screening  Never done   TETANUS/TDAP  Never done   Zoster Vaccines- Shingrix (1 of 2) Never done   INFLUENZA VACCINE  02/25/2021    There are no preventive care reminders to display for this patient.  Lab Results  Component Value Date   TSH 1.160 11/01/2019   Lab Results  Component Value Date   WBC 11.4 (H) 10/01/2020   HGB 14.6 10/01/2020   HCT 45.4 10/01/2020   MCV 87 10/01/2020   PLT 339 10/01/2020   Lab Results  Component Value Date   NA 140 10/01/2020   K 4.4 10/01/2020   CO2 19 (L) 10/01/2020   GLUCOSE 91 10/01/2020   BUN 16 10/01/2020   CREATININE 0.87 10/01/2020   BILITOT 0.2 10/01/2020   ALKPHOS 126 (H) 10/01/2020   AST 20 10/01/2020   ALT 20 10/01/2020   PROT 7.3 10/01/2020   ALBUMIN 4.3 10/01/2020   CALCIUM 9.3 10/01/2020   EGFR 100 10/01/2020   Lab Results  Component Value Date   CHOL 148 01/03/2021   Lab Results  Component Value Date   HDL 23 (L) 01/03/2021   Lab Results  Component Value Date   LDLCALC 97 01/03/2021   Lab Results  Component Value Date   TRIG 155 (H) 01/03/2021   Lab Results  Component Value Date   CHOLHDL 6.4 (H) 01/03/2021   Lab Results  Component Value Date   HGBA1C 5.9 (H) 01/03/2021      Assessment & Plan:  Diagnoses and all orders for this  visit: Chronic constipation  Patient has a lifelong constipation problems with problem of taking in fluids without caregiver attention to this.  I explained to the caregiver the options all which are not practical nor wanted.  She understands     Follow-up: No follow-ups on file.    Reinaldo Meeker, MD

## 2021-03-29 ENCOUNTER — Telehealth: Payer: Self-pay

## 2021-03-29 NOTE — Telephone Encounter (Signed)
[name] calling and questioning if [name] can begin ensure again. States they realized pt stopped ensure and that is when his BM decreased. Gerald tried giving ensure again and [name] has been having normal BM. She is questioning if this can be restarted and if so can it be sent to pharmacy so it can be placed back on pt's MAR.   Royce Macadamia, [place] 03/29/21 11:56 AM

## 2021-03-31 NOTE — Telephone Encounter (Signed)
Increase miralax to twice a day to see if this helps constipation..  Pt is already on metamucil.  I prefer not to start him back on ensure.

## 2021-04-03 NOTE — Telephone Encounter (Signed)
Called Tammy. She VU, states he has been taking miralax once in AM daily and it has not helped. They did give him left over ensure from previous script which makes him go regularly. Advised home try miralax twice daily, if this does not help they will return call.   Royce Macadamia, [place] 04/03/21 8:40 AM

## 2021-04-05 ENCOUNTER — Other Ambulatory Visit: Payer: Self-pay | Admitting: Family Medicine

## 2021-04-05 DIAGNOSIS — I10 Essential (primary) hypertension: Secondary | ICD-10-CM

## 2021-04-10 ENCOUNTER — Encounter: Payer: Self-pay | Admitting: Family Medicine

## 2021-04-10 ENCOUNTER — Ambulatory Visit (INDEPENDENT_AMBULATORY_CARE_PROVIDER_SITE_OTHER): Payer: Medicare Other | Admitting: Family Medicine

## 2021-04-10 ENCOUNTER — Other Ambulatory Visit: Payer: Self-pay

## 2021-04-10 VITALS — BP 120/70 | HR 78 | Temp 97.2°F | Resp 18 | Ht 69.0 in | Wt 207.0 lb

## 2021-04-10 DIAGNOSIS — F840 Autistic disorder: Secondary | ICD-10-CM

## 2021-04-10 DIAGNOSIS — Z23 Encounter for immunization: Secondary | ICD-10-CM | POA: Diagnosis not present

## 2021-04-10 DIAGNOSIS — I10 Essential (primary) hypertension: Secondary | ICD-10-CM

## 2021-04-10 DIAGNOSIS — R600 Localized edema: Secondary | ICD-10-CM

## 2021-04-10 DIAGNOSIS — R3981 Functional urinary incontinence: Secondary | ICD-10-CM

## 2021-04-10 DIAGNOSIS — R7303 Prediabetes: Secondary | ICD-10-CM | POA: Diagnosis not present

## 2021-04-10 DIAGNOSIS — K219 Gastro-esophageal reflux disease without esophagitis: Secondary | ICD-10-CM

## 2021-04-10 DIAGNOSIS — E782 Mixed hyperlipidemia: Secondary | ICD-10-CM | POA: Diagnosis not present

## 2021-04-10 DIAGNOSIS — K5904 Chronic idiopathic constipation: Secondary | ICD-10-CM

## 2021-04-10 DIAGNOSIS — F330 Major depressive disorder, recurrent, mild: Secondary | ICD-10-CM

## 2021-04-10 DIAGNOSIS — F72 Severe intellectual disabilities: Secondary | ICD-10-CM

## 2021-04-10 HISTORY — DX: Localized edema: R60.0

## 2021-04-10 MED ORDER — AMLODIPINE BESYLATE 5 MG PO TABS: 5.0000 mg | ORAL_TABLET | Freq: Every day | ORAL | 1 refills | Status: DC

## 2021-04-10 NOTE — Assessment & Plan Note (Signed)
The current medical regimen is effective;  continue present plan and medications. Continue thickening food.

## 2021-04-10 NOTE — Assessment & Plan Note (Signed)
Receives care from group home.

## 2021-04-10 NOTE — Assessment & Plan Note (Signed)
Restart ensure 1 can in am for breakfast. The current medical regimen is effective;  continue present plan and medications.

## 2021-04-10 NOTE — Assessment & Plan Note (Addendum)
Decreased amlodipine due to swelling.  Continue to work on eating a healthy diet and exercise.  Labs drawn today.

## 2021-04-10 NOTE — Assessment & Plan Note (Signed)
Recommend continue to work on eating healthy diet and exercise.  

## 2021-04-10 NOTE — Progress Notes (Addendum)
Subjective:  Patient ID: [name], male    DOB: Nov 18, 1960  Age: 60 y.o. MRN: [id number]  Chief Complaint  Patient presents with   Hypertension   Hyperlipidemia   Gastroesophageal Reflux    HPI Pt is a severely disabled, non verbal 60 yo WM who presents for chronic follow up. The patient eats healthy except he does not like breakfast. Pt is on lipitor for hyperlipidemia.  Autism/Depression: On celexa, seroquel, clonazepam. GERD: on pantoprazole. HTN: on amlodipine 10 mg once daily and diovan 40 mg daily.  Staff has noticed an indentation on his left leg where he always crosses his legs.  Also noted to have a rash on his rt arm. Unsure how long it has been there. Does not seem to itch or be painful. Pt has [facility] and was well controlled on miralax and metamucil until I held his ensure due to weight gain over the last year. They tried giving him an ensure and his constipation resolved. They are asking to restart ensure in place of breakfast.  Bladder incontinence: Uses depends. Changes up to 6 times per day    Current Outpatient Medications on File Prior to Visit  Medication Sig Dispense Refill   citalopram (CELEXA) 40 MG tablet Take 40 mg by mouth daily.     clonazePAM (KLONOPIN) 0.5 MG tablet Take 1 tablet every morning, 1 at noon, 1/2 at 4 pm and 1 at bedtime 105 tablet 3   docusate sodium (COLACE) 100 MG capsule Take 100 mg by mouth 2 (two) times daily as needed for mild constipation.     loratadine (CLARITIN) 10 MG tablet Take 10 mg by mouth daily.     Multiple Vitamin (MULTIVITAMIN) tablet Take 1 tablet by mouth daily. 90 tablet 0   pantoprazole (PROTONIX) 40 MG tablet TAKE 1 TABLET BY MOUTH TWICE A DAY 60 tablet 11   polyethylene glycol (MIRALAX / GLYCOLAX) 17 g packet Take 17 g by mouth daily.     valsartan (DIOVAN) 40 MG tablet TAKE 1 TABLET BY MOUTH EVERY DAY 30 tablet 11   acetaminophen (TYLENOL) 500 MG tablet Take 500 mg by mouth every 6 (six) hours as needed.      bisacodyl (DULCOLAX) 10 MG suppository Place 10 mg rectally daily as needed for moderate constipation.     psyllium (METAMUCIL) 58.6 % packet Take 1 packet by mouth daily. 30 each 12   QUEtiapine (SEROQUEL) 200 MG tablet Take 200 mg by mouth. Take 1 1/2 every morning, 1 1/2 at noon , 1/2 at 4 pm and 1 at bedtime     sodium phosphate (FLEET) 7-19 GM/118ML ENEM Place 133 mLs (1 enema total) rectally daily as needed for severe constipation. Dose 4.5 OZ Q72 H PRN. 133 mL 6   Starch, Thickening, LIQD 3 tsps per 4 oz of liquid. Recommend 12 oz liquid (water,tea, or juice) three times a day. 237 mL 11   No current facility-administered medications on file prior to visit.   Past Medical History:  Diagnosis Date   Aspiration pneumonia of both upper lobes due to regurgitated food ([place]) 11/01/2019   Autism    Developmental non-verbal disorder    Essential hypertension    GERD without esophagitis    Mixed hyperlipidemia    Prediabetes    Seizure disorder ([place])    Severe intellectual disabilities    Past Surgical History:  Procedure Laterality Date   LAPAROTOMY  2021    History reviewed. No pertinent family history. Social History  Socioeconomic History   Marital status: Single    Spouse name: Not on file   Number of children: Not on file   Years of education: Not on file   Highest education level: Not on file  Occupational History   Not on file  Tobacco Use   Smoking status: Never   Smokeless tobacco: Never  Substance and Sexual Activity   Alcohol use: Never   Drug use: Never   Sexual activity: Not on file  Other Topics Concern   Not on file  Social History Narrative   Not on file   Social Determinants of Health   Financial Resource Strain: Not on file  Food Insecurity: Not on file  Transportation Needs: Not on file  Physical Activity: Not on file  Stress: Not on file  Social Connections: Not on file    Review of Systems  Constitutional:  Negative for activity change,  appetite change and fever.  HENT:  Negative for rhinorrhea.   Respiratory:  Positive for cough.   Skin:  Positive for rash (right forearm).    Objective:  BP 120/70   Pulse 78   Temp (!) 97.2 F (36.2 C)   Resp 18   Ht '5\' 9"'$  (1.753 m)   Wt 207 lb (93.9 kg)   BMI 30.57 kg/m   BP/Weight 04/10/2021 [id number]  Systolic BP 123456 [id number]  Diastolic BP 70 60 68  Wt. (Lbs) 207 208 205  BMI 30.57 30.72 30.27    Physical Exam Vitals reviewed.  Constitutional:      Appearance: Normal appearance.  Neck:     Vascular: No carotid bruit.  Cardiovascular:     Rate and Rhythm: Normal rate.     Heart sounds: Normal heart sounds.  Pulmonary:     Effort: Pulmonary effort is normal.     Breath sounds: Normal breath sounds.  Abdominal:     General: Bowel sounds are normal.     Palpations: Abdomen is soft.     Tenderness: There is no abdominal tenderness.  Musculoskeletal:     Right lower leg: No edema.     Left lower leg: No edema.  Skin:    Findings: Rash (mild erythema on rt forearm. looks like sunburn but not in sun this weekedn. no excoriations.) present.  Neurological:     Mental Status: He is alert.  Psychiatric:     Comments: Behavior baseline. Sitting quietly.    Diabetic Foot Exam - Simple   No data filed      Lab Results  Component Value Date   WBC 7.8 04/11/2021   HGB 14.9 04/11/2021   HCT 44.8 04/11/2021   PLT 285 04/11/2021   GLUCOSE 99 04/11/2021   CHOL 177 04/11/2021   TRIG 127 04/11/2021   HDL 32 (L) 04/11/2021   LDLCALC 122 (H) 04/11/2021   ALT 18 04/11/2021   AST 19 04/11/2021   NA 142 04/11/2021   K 4.4 04/11/2021   CL 104 04/11/2021   CREATININE 0.84 04/11/2021   BUN 6 04/11/2021   CO2 23 04/11/2021   TSH 1.160 11/01/2019   INR 1.1 12/01/2008   HGBA1C 6.1 (H) 04/11/2021      Assessment & Plan:    Problem List Items Addressed This Visit       Cardiovascular and Mediastinum   Essential hypertension - Primary    Decreased  amlodipine due to swelling.  Continue to work on eating a healthy diet and exercise.  Labs drawn today.  Relevant Medications   amLODipine (NORVASC) 5 MG tablet   Other Relevant Orders   CBC with Differential/Platelet (Completed)   Comprehensive metabolic panel (Completed)     Digestive   Chronic idiopathic constipation (Chronic)    Restart ensure 1 can in am for breakfast. The current medical regimen is effective;  continue present plan and medications.       Gastroesophageal reflux disease without esophagitis    The current medical regimen is effective;  continue present plan and medications. Continue thickening food.         Other   Urinary incontinence due to cognitive impairment    Disposable briefs up to 6 per day.  Needs under pads 150/month. Gloves. 4 boxes per month.      Mixed hyperlipidemia    Well controlled.  No changes to medicines.  Continue to work on eating a healthy diet and exercise.  Labs drawn today.        Relevant Medications   amLODipine (NORVASC) 5 MG tablet   Other Relevant Orders   Lipid panel (Completed)   Autism    Management per specialist.        Depression, major, recurrent, mild ([place])    Management per specialist.        Prediabetes    Recommend continue to work on eating healthy diet and exercise.       Relevant Orders   Hemoglobin A1c (Completed)   Severe intellectual disabilities    Receives care from group home.       Pedal edema    Decrease amlodipine to 5 mg daily.       Other Visit Diagnoses     Need for influenza vaccination       Relevant Orders   Flu Vaccine MDCK QUAD PF (Completed)     .  Meds ordered this encounter  Medications   amLODipine (NORVASC) 5 MG tablet    Sig: Take 1 tablet (5 mg total) by mouth daily.    Dispense:  90 tablet    Refill:  1     Orders Placed This Encounter  Procedures   Flu Vaccine MDCK QUAD PF   CBC with Differential/Platelet   Hemoglobin A1c    Comprehensive metabolic panel   Lipid panel     Follow-up: Return in about 3 months (around 07/10/2021) for chronic fasting.  An After Visit Summary was printed and given to the patient.  Rochel Brome, MD [facility] [phone]

## 2021-04-10 NOTE — Assessment & Plan Note (Signed)
Management per specialist. 

## 2021-04-10 NOTE — Assessment & Plan Note (Signed)
Well controlled.  ?No changes to medicines.  ?Continue to work on eating a healthy diet and exercise.  ?Labs drawn today.  ?

## 2021-04-10 NOTE — Assessment & Plan Note (Signed)
Decrease amlodipine to 5mg daily.

## 2021-04-11 ENCOUNTER — Other Ambulatory Visit: Payer: Medicare Other

## 2021-04-11 ENCOUNTER — Other Ambulatory Visit: Payer: Self-pay

## 2021-04-11 DIAGNOSIS — E782 Mixed hyperlipidemia: Secondary | ICD-10-CM

## 2021-04-11 DIAGNOSIS — I10 Essential (primary) hypertension: Secondary | ICD-10-CM

## 2021-04-11 DIAGNOSIS — R7303 Prediabetes: Secondary | ICD-10-CM

## 2021-04-12 LAB — LIPID PANEL
Chol/HDL Ratio: 5.5 ratio — ABNORMAL HIGH (ref 0.0–5.0)
Cholesterol, Total: 177 mg/dL (ref 100–199)
HDL: 32 mg/dL — ABNORMAL LOW (ref >39)
LDL Chol Calc (NIH): 122 mg/dL — ABNORMAL HIGH (ref 0–99)
Triglycerides: 127 mg/dL (ref 0–149)
VLDL Cholesterol Cal: 23 mg/dL (ref 5–40)

## 2021-04-12 LAB — CBC WITH DIFFERENTIAL/PLATELET
Basophils Absolute: 0.1 10*3/uL (ref 0.0–0.2)
Basos: 1 %
EOS (ABSOLUTE): 0.3 10*3/uL (ref 0.0–0.4)
Eos: 3 %
Hematocrit: 44.8 % (ref 37.5–51.0)
Hemoglobin: 14.9 g/dL (ref 13.0–17.7)
Immature Grans (Abs): 0 10*3/uL (ref 0.0–0.1)
Immature Granulocytes: 0 %
Lymphocytes Absolute: 1.6 10*3/uL (ref 0.7–3.1)
Lymphs: 20 %
MCH: 28.4 pg (ref 26.6–33.0)
MCHC: 33.3 g/dL (ref 31.5–35.7)
MCV: 86 fL (ref 79–97)
Monocytes Absolute: 0.7 10*3/uL (ref 0.1–0.9)
Monocytes: 9 %
Neutrophils Absolute: 5.2 10*3/uL (ref 1.4–7.0)
Neutrophils: 67 %
Platelets: 285 10*3/uL (ref 150–450)
RBC: 5.24 x10E6/uL (ref 4.14–5.80)
RDW: 12.7 % (ref 11.6–15.4)
WBC: 7.8 10*3/uL (ref 3.4–10.8)

## 2021-04-12 LAB — COMPREHENSIVE METABOLIC PANEL
ALT: 18 IU/L (ref 0–44)
AST: 19 IU/L (ref 0–40)
Albumin/Globulin Ratio: 1.6 (ref 1.2–2.2)
Albumin: 4.4 g/dL (ref 3.8–4.9)
Alkaline Phosphatase: 117 IU/L (ref 44–121)
BUN/Creatinine Ratio: 7 — ABNORMAL LOW (ref 9–20)
BUN: 6 mg/dL (ref 6–24)
Bilirubin Total: 0.3 mg/dL (ref 0.0–1.2)
CO2: 23 mmol/L (ref 20–29)
Calcium: 9.5 mg/dL (ref 8.7–10.2)
Chloride: 104 mmol/L (ref 96–106)
Creatinine, Ser: 0.84 mg/dL (ref 0.76–1.27)
Globulin, Total: 2.8 g/dL (ref 1.5–4.5)
Glucose: 99 mg/dL (ref 65–99)
Potassium: 4.4 mmol/L (ref 3.5–5.2)
Sodium: 142 mmol/L (ref 134–144)
Total Protein: 7.2 g/dL (ref 6.0–8.5)
eGFR: 101 mL/min/{1.73_m2} (ref >59)

## 2021-04-12 LAB — HEMOGLOBIN A1C
Est. average glucose Bld gHb Est-mCnc: 128 mg/dL
Hgb A1c MFr Bld: 6.1 % — ABNORMAL HIGH (ref 4.8–5.6)

## 2021-04-12 LAB — CARDIOVASCULAR RISK ASSESSMENT

## 2021-04-16 ENCOUNTER — Other Ambulatory Visit: Payer: Self-pay | Admitting: Family Medicine

## 2021-04-16 ENCOUNTER — Other Ambulatory Visit: Payer: Self-pay

## 2021-04-16 MED ORDER — ENSURE COMPLETE PO LIQD: 237.0000 mL | Freq: Every day | ORAL | 11 refills | Status: DC

## 2021-04-16 MED ORDER — LACTULOSE 10 GM/15ML PO SOLN: ORAL | 10 refills | Status: DC

## 2021-04-16 NOTE — Telephone Encounter (Signed)
Caregiver calling requesting lactulose refill as pharmacy let them know it would be needed. Also requesting order for ensure in AM for their MAR.   Harrell Lark 04/16/21 7:49 AM

## 2021-04-26 ENCOUNTER — Other Ambulatory Visit: Payer: Self-pay

## 2021-04-26 DIAGNOSIS — F79 Unspecified intellectual disabilities: Secondary | ICD-10-CM

## 2021-04-26 MED ORDER — MONTELUKAST SODIUM 10 MG PO TABS: 10.0000 mg | ORAL_TABLET | Freq: Every day | ORAL | 11 refills | Status: DC

## 2021-04-30 ENCOUNTER — Other Ambulatory Visit: Payer: Self-pay

## 2021-04-30 DIAGNOSIS — R3981 Functional urinary incontinence: Secondary | ICD-10-CM

## 2021-04-30 HISTORY — DX: Functional urinary incontinence: R39.81

## 2021-04-30 MED ORDER — ATORVASTATIN CALCIUM 20 MG PO TABS: 20.0000 mg | ORAL_TABLET | Freq: Every day | ORAL | 1 refills | Status: DC

## 2021-04-30 NOTE — Assessment & Plan Note (Signed)
Disposable briefs up to 6 per day.  Needs under pads 150/month. Gloves. 4 boxes per month.

## 2021-05-01 ENCOUNTER — Other Ambulatory Visit: Payer: Self-pay | Admitting: Family Medicine

## 2021-05-01 DIAGNOSIS — F330 Major depressive disorder, recurrent, mild: Secondary | ICD-10-CM

## 2021-05-06 NOTE — Progress Notes (Deleted)
Acute Office Visit  Subjective:    Patient ID: [name], male    DOB: 10/31/1960, 60 y.o.   MRN: [id number]  No chief complaint on file.   HPI Patient is in today for ***  Past Medical History:  Diagnosis Date   Aspiration pneumonia of both upper lobes due to regurgitated food ([place]) 11/01/2019   Autism    Developmental non-verbal disorder    Essential hypertension    GERD without esophagitis    Mixed hyperlipidemia    Prediabetes    Seizure disorder ([place])    Severe intellectual disabilities     Past Surgical History:  Procedure Laterality Date   LAPAROTOMY  2021    No family history on file.  Social History   Socioeconomic History   Marital status: Single    Spouse name: Not on file   Number of children: Not on file   Years of education: Not on file   Highest education level: Not on file  Occupational History   Not on file  Tobacco Use   Smoking status: Never   Smokeless tobacco: Never  Substance and Sexual Activity   Alcohol use: Never   Drug use: Never   Sexual activity: Not on file  Other Topics Concern   Not on file  Social History Narrative   Not on file   Social Determinants of Health   Financial Resource Strain: Not on file  Food Insecurity: Not on file  Transportation Needs: Not on file  Physical Activity: Not on file  Stress: Not on file  Social Connections: Not on file  Intimate Partner Violence: Not on file    Outpatient Medications Prior to Visit  Medication Sig Dispense Refill   acetaminophen (TYLENOL) 500 MG tablet Take 500 mg by mouth every 6 (six) hours as needed.     amLODipine (NORVASC) 5 MG tablet Take 1 tablet (5 mg total) by mouth daily. 90 tablet 1   atorvastatin (LIPITOR) 20 MG tablet Take 1 tablet (20 mg total) by mouth daily. 90 tablet 1   bisacodyl (DULCOLAX) 10 MG suppository Place 10 mg rectally daily as needed for moderate constipation.     citalopram (CELEXA) 40 MG tablet TAKE ONE TABLET BY MOUTH EVERY  MORNING 30 tablet 11   clonazePAM (KLONOPIN) 0.5 MG tablet Take 1 tablet every morning, 1 at noon, 1/2 at 4 pm and 1 at bedtime 105 tablet 3   docusate sodium (COLACE) 100 MG capsule Take 100 mg by mouth 2 (two) times daily as needed for mild constipation.     feeding supplement, ENSURE COMPLETE, (ENSURE COMPLETE) LIQD Take 237 mLs by mouth daily with breakfast. 7110 mL 11   lactulose (CHRONULAC) 10 GM/15ML solution GIVE 45 ML (30 GM) BY MOUTH 3 TIMES A DAY 1892 mL 10   loratadine (CLARITIN) 10 MG tablet Take 10 mg by mouth daily.     montelukast (SINGULAIR) 10 MG tablet Take 1 tablet (10 mg total) by mouth daily. 30 tablet 11   Multiple Vitamin (MULTIVITAMIN) tablet Take 1 tablet by mouth daily. 90 tablet 0   pantoprazole (PROTONIX) 40 MG tablet TAKE 1 TABLET BY MOUTH TWICE A DAY 60 tablet 11   polyethylene glycol (MIRALAX / GLYCOLAX) 17 g packet Take 17 g by mouth daily.     psyllium (METAMUCIL) 58.6 % packet Take 1 packet by mouth daily. 30 each 12   QUEtiapine (SEROQUEL) 200 MG tablet Take 200 mg by mouth. Take 1 1/2 every morning, 1  1/2 at noon , 1/2 at 4 pm and 1 at bedtime     sodium phosphate (FLEET) 7-19 GM/118ML ENEM Place 133 mLs (1 enema total) rectally daily as needed for severe constipation. Dose 4.5 OZ Q72 H PRN. 133 mL 6   Starch, Thickening, LIQD 3 tsps per 4 oz of liquid. Recommend 12 oz liquid (water,tea, or juice) three times a day. 237 mL 11   valsartan (DIOVAN) 40 MG tablet TAKE 1 TABLET BY MOUTH EVERY DAY 30 tablet 11   No facility-administered medications prior to visit.    Allergies  Allergen Reactions   Augmentin [Amoxicillin-Pot Clavulanate]     Review of Systems     Objective:    Physical Exam  There were no vitals taken for this visit. Wt Readings from Last 3 Encounters:  04/10/21 207 lb (93.9 kg)  03/22/21 208 lb (94.3 kg)  11/29/20 205 lb (93 kg)    Health Maintenance Due  Topic Date Due   COVID-19 Vaccine (1) Never done   HIV Screening  Never  done   Hepatitis C Screening  Never done   TETANUS/TDAP  Never done   Zoster Vaccines- Shingrix (1 of 2) Never done    There are no preventive care reminders to display for this patient.   Lab Results  Component Value Date   TSH 1.160 11/01/2019   Lab Results  Component Value Date   WBC 7.8 04/11/2021   HGB 14.9 04/11/2021   HCT 44.8 04/11/2021   MCV 86 04/11/2021   PLT 285 04/11/2021   Lab Results  Component Value Date   NA 142 04/11/2021   K 4.4 04/11/2021   CO2 23 04/11/2021   GLUCOSE 99 04/11/2021   BUN 6 04/11/2021   CREATININE 0.84 04/11/2021   BILITOT 0.3 04/11/2021   ALKPHOS 117 04/11/2021   AST 19 04/11/2021   ALT 18 04/11/2021   PROT 7.2 04/11/2021   ALBUMIN 4.4 04/11/2021   CALCIUM 9.5 04/11/2021   EGFR 101 04/11/2021   Lab Results  Component Value Date   CHOL 177 04/11/2021   Lab Results  Component Value Date   HDL 32 (L) 04/11/2021   Lab Results  Component Value Date   LDLCALC 122 (H) 04/11/2021   Lab Results  Component Value Date   TRIG 127 04/11/2021   Lab Results  Component Value Date   CHOLHDL 5.5 (H) 04/11/2021   Lab Results  Component Value Date   HGBA1C 6.1 (H) 04/11/2021       Assessment & Plan:   Problem List Items Addressed This Visit   None  No orders of the defined types were placed in this encounter.   No orders of the defined types were placed in this encounter.    Follow-up: No follow-ups on file.  An After Visit Summary was printed and given to the patient.  Rochel Brome, MD [facility] [phone]

## 2021-05-07 ENCOUNTER — Other Ambulatory Visit: Payer: Self-pay

## 2021-05-07 ENCOUNTER — Encounter: Payer: Self-pay | Admitting: Family Medicine

## 2021-05-07 ENCOUNTER — Ambulatory Visit: Payer: Medicare Other | Admitting: Family Medicine

## 2021-05-07 ENCOUNTER — Ambulatory Visit (INDEPENDENT_AMBULATORY_CARE_PROVIDER_SITE_OTHER): Payer: Medicare Other | Admitting: Family Medicine

## 2021-05-07 VITALS — BP 110/76 | HR 98 | Temp 97.3°F | Ht 69.0 in | Wt 211.0 lb

## 2021-05-07 DIAGNOSIS — L602 Onychogryphosis: Secondary | ICD-10-CM

## 2021-05-07 DIAGNOSIS — M545 Low back pain, unspecified: Secondary | ICD-10-CM

## 2021-05-07 DIAGNOSIS — L03031 Cellulitis of right toe: Secondary | ICD-10-CM

## 2021-05-07 MED ORDER — CEPHALEXIN 500 MG PO CAPS: 500.0000 mg | ORAL_CAPSULE | Freq: Two times a day (BID) | ORAL | 0 refills | Status: DC

## 2021-05-07 MED ORDER — CYCLOBENZAPRINE HCL 5 MG PO TABS: 5.0000 mg | ORAL_TABLET | Freq: Three times a day (TID) | ORAL | 1 refills | Status: DC | PRN

## 2021-05-07 NOTE — Progress Notes (Signed)
Acute Office Visit  Subjective:    Patient ID: [name], male    DOB: Jul 26, 1961, 60 y.o.   MRN: [id number]  Chief Complaint  Patient presents with   Big toe on right foot infected    HPI Patient is in today with his caretaker for a bleeding toe.  The patient's right great toe started to bleed along the medial side.  The patient has significant onychomycosis and very thickened nails.  Patient is nonverbal and is unable to tell me if it is hurting.  Past Medical History:  Diagnosis Date   Aspiration pneumonia of both upper lobes due to regurgitated food ([place]) 11/01/2019   Autism    Developmental non-verbal disorder    Essential hypertension    GERD without esophagitis    Mixed hyperlipidemia    Prediabetes    Seizure disorder ([place])    Severe intellectual disabilities     Past Surgical History:  Procedure Laterality Date   LAPAROTOMY  2021    History reviewed. No pertinent family history.  Social History   Socioeconomic History   Marital status: Single    Spouse name: Not on file   Number of children: Not on file   Years of education: Not on file   Highest education level: Not on file  Occupational History   Not on file  Tobacco Use   Smoking status: Never   Smokeless tobacco: Never  Substance and Sexual Activity   Alcohol use: Never   Drug use: Never   Sexual activity: Not on file  Other Topics Concern   Not on file  Social History Narrative   Not on file   Social Determinants of Health   Financial Resource Strain: Not on file  Food Insecurity: Not on file  Transportation Needs: Not on file  Physical Activity: Not on file  Stress: Not on file  Social Connections: Not on file  Intimate Partner Violence: Not on file    Outpatient Medications Prior to Visit  Medication Sig Dispense Refill   acetaminophen (TYLENOL) 500 MG tablet Take 500 mg by mouth every 6 (six) hours as needed.     amLODipine (NORVASC) 5 MG tablet Take 1 tablet (5 mg total)  by mouth daily. 90 tablet 1   atorvastatin (LIPITOR) 20 MG tablet Take 1 tablet (20 mg total) by mouth daily. 90 tablet 1   bisacodyl (DULCOLAX) 10 MG suppository Place 10 mg rectally daily as needed for moderate constipation.     citalopram (CELEXA) 40 MG tablet TAKE ONE TABLET BY MOUTH EVERY MORNING 30 tablet 11   clonazePAM (KLONOPIN) 0.5 MG tablet Take 1 tablet every morning, 1 at noon, 1/2 at 4 pm and 1 at bedtime 105 tablet 3   docusate sodium (COLACE) 100 MG capsule Take 100 mg by mouth 2 (two) times daily as needed for mild constipation.     feeding supplement, ENSURE COMPLETE, (ENSURE COMPLETE) LIQD Take 237 mLs by mouth daily with breakfast. 7110 mL 11   lactulose (CHRONULAC) 10 GM/15ML solution GIVE 45 ML (30 GM) BY MOUTH 3 TIMES A DAY 1892 mL 10   loratadine (CLARITIN) 10 MG tablet Take 10 mg by mouth daily.     montelukast (SINGULAIR) 10 MG tablet Take 1 tablet (10 mg total) by mouth daily. 30 tablet 11   Multiple Vitamin (MULTIVITAMIN) tablet Take 1 tablet by mouth daily. 90 tablet 0   pantoprazole (PROTONIX) 40 MG tablet TAKE 1 TABLET BY MOUTH TWICE A DAY 60 tablet  11   polyethylene glycol (MIRALAX / GLYCOLAX) 17 g packet Take 17 g by mouth daily.     psyllium (METAMUCIL) 58.6 % packet Take 1 packet by mouth daily. 30 each 12   QUEtiapine (SEROQUEL) 200 MG tablet Take 200 mg by mouth. Take 1 1/2 every morning, 1 1/2 at noon , 1/2 at 4 pm and 1 at bedtime     sodium phosphate (FLEET) 7-19 GM/118ML ENEM Place 133 mLs (1 enema total) rectally daily as needed for severe constipation. Dose 4.5 OZ Q72 H PRN. 133 mL 6   Starch, Thickening, LIQD 3 tsps per 4 oz of liquid. Recommend 12 oz liquid (water,tea, or juice) three times a day. 237 mL 11   valsartan (DIOVAN) 40 MG tablet TAKE 1 TABLET BY MOUTH EVERY DAY 30 tablet 11   No facility-administered medications prior to visit.    Allergies  Allergen Reactions   Augmentin [Amoxicillin-Pot Clavulanate]     Review of Systems   Constitutional:  Negative for appetite change, fatigue and fever.  HENT:  Negative for congestion, ear pain and sore throat.   Respiratory:  Negative for cough and shortness of breath.   Cardiovascular:  Negative for chest pain and leg swelling.  Gastrointestinal:  Negative for abdominal pain, constipation, diarrhea, nausea and vomiting.  Genitourinary:  Negative for dysuria and frequency.  Musculoskeletal:  Negative for arthralgias and myalgias.  Skin:  Positive for wound (Right foot big toe infected).  Neurological:  Negative for dizziness and headaches.  Psychiatric/Behavioral:  Negative for dysphoric mood. The patient is not nervous/anxious.       Objective:    Physical Exam Skin:    Comments: Very thickened onychomycosis.  Right lateral great toe proximally at the cuticle has been bleeding.  Erythema around the nail.  Patient does not respond to pressure on the nail or the surrounding cuticle.    BP 110/76 (BP Location: Right Arm, Patient Position: Sitting)   Pulse 98   Temp (!) 97.3 F (36.3 C) (Temporal)   Ht 5' 9" (1.753 m)   Wt 211 lb (95.7 kg)   SpO2 98%   BMI 31.16 kg/m  Wt Readings from Last 3 Encounters:  05/07/21 211 lb (95.7 kg)  04/10/21 207 lb (93.9 kg)  03/22/21 208 lb (94.3 kg)    Health Maintenance Due  Topic Date Due   HIV Screening  Never done   Hepatitis C Screening  Never done   TETANUS/TDAP  Never done   Zoster Vaccines- Shingrix (1 of 2) Never done    There are no preventive care reminders to display for this patient.   Lab Results  Component Value Date   TSH 1.160 11/01/2019   Lab Results  Component Value Date   WBC 7.8 04/11/2021   HGB 14.9 04/11/2021   HCT 44.8 04/11/2021   MCV 86 04/11/2021   PLT 285 04/11/2021   Lab Results  Component Value Date   NA 142 04/11/2021   K 4.4 04/11/2021   CO2 23 04/11/2021   GLUCOSE 99 04/11/2021   BUN 6 04/11/2021   CREATININE 0.84 04/11/2021   BILITOT 0.3 04/11/2021   ALKPHOS 117  04/11/2021   AST 19 04/11/2021   ALT 18 04/11/2021   PROT 7.2 04/11/2021   ALBUMIN 4.4 04/11/2021   CALCIUM 9.5 04/11/2021   EGFR 101 04/11/2021   Lab Results  Component Value Date   CHOL 177 04/11/2021   Lab Results  Component Value Date   HDL 32 (L)  04/11/2021   Lab Results  Component Value Date   LDLCALC 122 (H) 04/11/2021   Lab Results  Component Value Date   TRIG 127 04/11/2021   Lab Results  Component Value Date   CHOLHDL 5.5 (H) 04/11/2021   Lab Results  Component Value Date   HGBA1C 6.1 (H) 04/11/2021         Assessment & Plan:   Problem List Items Addressed This Visit       Musculoskeletal and Integument   Onychogryphosis - Primary    For to podiatry for nail care.      Relevant Orders   Ambulatory referral to Podiatry   Infection of nail bed of toe of right foot    Started Keflex 500 mg twice daily for 1 week. Refer to podiatry.      Relevant Medications   cephALEXin (KEFLEX) 500 MG capsule     I,Lauren M Auman,acting as a scribe for Rochel Brome, MD.,have documented all relevant documentation on the behalf of Rochel Brome, MD,as directed by  Rochel Brome, MD while in the presence of Rochel Brome, MD.    Oleta Mouse, CMA

## 2021-05-09 DIAGNOSIS — L602 Onychogryphosis: Secondary | ICD-10-CM | POA: Insufficient documentation

## 2021-05-09 DIAGNOSIS — L03031 Cellulitis of right toe: Secondary | ICD-10-CM | POA: Insufficient documentation

## 2021-05-09 HISTORY — DX: Onychogryphosis: L60.2

## 2021-05-09 NOTE — Assessment & Plan Note (Signed)
Started Keflex 500 mg twice daily for 1 week. Refer to podiatry.

## 2021-05-09 NOTE — Assessment & Plan Note (Addendum)
Refer to podiatry for nail care.

## 2021-05-30 ENCOUNTER — Ambulatory Visit: Payer: Medicare Other | Admitting: Podiatry

## 2021-06-03 ENCOUNTER — Other Ambulatory Visit: Payer: Self-pay

## 2021-06-03 ENCOUNTER — Ambulatory Visit (INDEPENDENT_AMBULATORY_CARE_PROVIDER_SITE_OTHER): Payer: Medicare Other | Admitting: Podiatry

## 2021-06-03 DIAGNOSIS — M79609 Pain in unspecified limb: Secondary | ICD-10-CM

## 2021-06-03 DIAGNOSIS — B351 Tinea unguium: Secondary | ICD-10-CM

## 2021-06-03 NOTE — Progress Notes (Signed)
  Subjective:  Patient ID: [name], male    DOB: [birth year],  MRN: [id number]  Chief Complaint  Patient presents with   debride    [facility] -Bl nails care    60 y.o. male presents with the above complaint. History confirmed with patient. Patient non-verbal, does not participate in history. Nails appear to hurt him with withdrawing of foot on touch of the nails.  PCP: Rochel Brome, MD; last seen 05/07/21  Objective:  Physical Exam: warm, good capillary refill, nail exam onychomycosis of the toenails and POP about the toenails, no trophic changes or ulcerative lesions. DP pulses palpable, PT pulses palpable, and protective sensation intact Left Foot: normal exam, no swelling, tenderness, instability; ligaments intact, full range of motion of all ankle/foot joints  Right Foot: normal exam, no swelling, tenderness, instability; ligaments intact, full range of motion of all ankle/foot joints   Hgb A1c MFr Bld  Date Value Ref Range Status  04/11/2021 6.1 (H) 4.8 - 5.6 % Final    Comment:             Prediabetes: 5.7 - 6.4          Diabetes: >6.4          Glycemic control for adults with diabetes: <7.0    Assessment:   1. Pain due to onychomycosis of nail    Plan:  Patient was evaluated and treated and all questions answered.  Onychodystrophy -Nails palliatively debrided secondary to pain  Procedure: Nail Debridement Type of Debridement: manual, sharp debridement. Instrumentation: Nail nipper, rotary burr. Number of Nails: 10  Return if symptoms worsen or fail to improve.

## 2021-06-04 ENCOUNTER — Encounter: Payer: Self-pay | Admitting: Family Medicine

## 2021-06-25 ENCOUNTER — Other Ambulatory Visit: Payer: Self-pay

## 2021-06-25 MED ORDER — ATORVASTATIN CALCIUM 20 MG PO TABS: 20.0000 mg | ORAL_TABLET | Freq: Every day | ORAL | 1 refills | Status: DC

## 2021-07-29 NOTE — Progress Notes (Signed)
Acute Office Visit  Subjective:    Patient ID: [name], male    DOB: [birth year], 61 y.o.   MRN: [id number]  Chief Complaint  Patient presents with   Wound Check   sore on bottom    HPI: Patient is in today for sore on bottom. No fever, chills. no other sores.   Past Medical History:  Diagnosis Date   Aspiration pneumonia of both upper lobes due to regurgitated food ([place]) 11/01/2019   Autism    Developmental non-verbal disorder    Essential hypertension    GERD without esophagitis    Mixed hyperlipidemia    Prediabetes    Seizure disorder ([place])    Severe intellectual disabilities     Past Surgical History:  Procedure Laterality Date   LAPAROTOMY  2021    No family history on file.  Social History   Socioeconomic History   Marital status: Single    Spouse name: Not on file   Number of children: Not on file   Years of education: Not on file   Highest education level: Not on file  Occupational History   Not on file  Tobacco Use   Smoking status: Never   Smokeless tobacco: Never  Substance and Sexual Activity   Alcohol use: Never   Drug use: Never   Sexual activity: Not on file  Other Topics Concern   Not on file  Social History Narrative   Not on file   Social Determinants of Health   Financial Resource Strain: Not on file  Food Insecurity: Not on file  Transportation Needs: Not on file  Physical Activity: Not on file  Stress: Not on file  Social Connections: Not on file  Intimate Partner Violence: Not on file    Outpatient Medications Prior to Visit  Medication Sig Dispense Refill   acetaminophen (TYLENOL) 500 MG tablet Take 500 mg by mouth every 6 (six) hours as needed.     amLODipine (NORVASC) 5 MG tablet Take 1 tablet (5 mg total) by mouth daily. 90 tablet 1   atorvastatin (LIPITOR) 20 MG tablet Take by mouth.     atorvastatin (LIPITOR) 20 MG tablet Take 1 tablet (20 mg total) by mouth daily. 90 tablet 1   bisacodyl (DULCOLAX) 10 MG  suppository Place 10 mg rectally daily as needed for moderate constipation.     cephALEXin (KEFLEX) 500 MG capsule Take 1 capsule (500 mg total) by mouth 2 (two) times daily. 14 capsule 0   citalopram (CELEXA) 40 MG tablet TAKE ONE TABLET BY MOUTH EVERY MORNING 30 tablet 11   clonazePAM (KLONOPIN) 0.5 MG tablet Take 1 tablet every morning, 1 at noon, 1/2 at 4 pm and 1 at bedtime 105 tablet 3   docusate sodium (COLACE) 100 MG capsule Take 100 mg by mouth 2 (two) times daily as needed for mild constipation.     feeding supplement, ENSURE COMPLETE, (ENSURE COMPLETE) LIQD Take 237 mLs by mouth daily with breakfast. 7110 mL 11   lactulose (CHRONULAC) 10 GM/15ML solution GIVE 45 ML (30 GM) BY MOUTH 3 TIMES A DAY 1892 mL 10   loratadine (CLARITIN) 10 MG tablet Take 10 mg by mouth daily.     montelukast (SINGULAIR) 10 MG tablet Take 1 tablet (10 mg total) by mouth daily. 30 tablet 11   Multiple Vitamin (MULTIVITAMIN) tablet Take 1 tablet by mouth daily. 90 tablet 0   pantoprazole (PROTONIX) 40 MG tablet TAKE 1 TABLET BY MOUTH TWICE A DAY 60  tablet 11   polyethylene glycol (MIRALAX / GLYCOLAX) 17 g packet Take 17 g by mouth daily.     polyethylene glycol powder (MIRALAX) 17 GM/SCOOP powder Take 17 gm bid (noon and 6pm)     psyllium (METAMUCIL) 58.6 % packet Take 1 packet by mouth daily. 30 each 12   QUEtiapine (SEROQUEL) 200 MG tablet Take 200 mg by mouth. Take 1 1/2 every morning, 1 1/2 at noon , 1/2 at 4 pm and 1 at bedtime     sodium phosphate (FLEET) 7-19 GM/118ML ENEM Place 133 mLs (1 enema total) rectally daily as needed for severe constipation. Dose 4.5 OZ Q72 H PRN. 133 mL 6   Starch, Thickening, LIQD 3 tsps per 4 oz of liquid. Recommend 12 oz liquid (water,tea, or juice) three times a day. 237 mL 11   valsartan (DIOVAN) 40 MG tablet TAKE 1 TABLET BY MOUTH EVERY DAY 30 tablet 11   No facility-administered medications prior to visit.    Allergies  Allergen Reactions   Augmentin [Amoxicillin-Pot  Clavulanate]     Review of Systems  Constitutional:  Negative for chills, fatigue, fever and unexpected weight change.  HENT:  Negative for congestion, ear pain, sinus pain and sore throat.   Cardiovascular:  Negative for chest pain and palpitations.  Gastrointestinal:  Negative for abdominal pain, blood in stool, constipation, diarrhea, nausea and vomiting.  Endocrine: Negative for polydipsia.  Genitourinary:  Negative for dysuria.  Musculoskeletal:  Negative for back pain.  Skin:  Negative for rash.  Neurological:  Negative for headaches.      Objective:    Physical Exam Vitals reviewed.  Constitutional:      Appearance: Normal appearance.  Skin:    Findings: Erythema (top of buttock crease. no open sore.) present.  Neurological:     Mental Status: He is alert.    BP 110/78    Pulse (!) 102    Temp (!) 97.2 F (36.2 C)    Resp 16    Wt 203 lb 6.4 oz (92.3 kg)    BMI 30.04 kg/m  Wt Readings from Last 3 Encounters:  08/02/21 204 lb (92.5 kg)  07/30/21 203 lb 6.4 oz (92.3 kg)  05/07/21 211 lb (95.7 kg)    Health Maintenance Due  Topic Date Due   COVID-19 Vaccine (1) Never done   Pneumococcal Vaccine 56-53 Years old (1 - PCV) Never done   HIV Screening  Never done   Hepatitis C Screening  Never done   TETANUS/TDAP  Never done   Zoster Vaccines- Shingrix (1 of 2) Never done    There are no preventive care reminders to display for this patient.   Lab Results  Component Value Date   TSH 1.160 11/01/2019   Lab Results  Component Value Date   WBC 7.8 04/11/2021   HGB 14.9 04/11/2021   HCT 44.8 04/11/2021   MCV 86 04/11/2021   PLT 285 04/11/2021   Lab Results  Component Value Date   NA 142 04/11/2021   K 4.4 04/11/2021   CO2 23 04/11/2021   GLUCOSE 99 04/11/2021   BUN 6 04/11/2021   CREATININE 0.84 04/11/2021   BILITOT 0.3 04/11/2021   ALKPHOS 117 04/11/2021   AST 19 04/11/2021   ALT 18 04/11/2021   PROT 7.2 04/11/2021   ALBUMIN 4.4 04/11/2021    CALCIUM 9.5 04/11/2021   EGFR 101 04/11/2021   Lab Results  Component Value Date   CHOL 177 04/11/2021   Lab Results  Component Value Date   HDL 32 (L) 04/11/2021   Lab Results  Component Value Date   LDLCALC 122 (H) 04/11/2021   Lab Results  Component Value Date   TRIG 127 04/11/2021   Lab Results  Component Value Date   CHOLHDL 5.5 (H) 04/11/2021   Lab Results  Component Value Date   HGBA1C 6.1 (H) 04/11/2021       Assessment & Plan:   Problem List Items Addressed This Visit   None  Meds ordered this encounter  Medications   nystatin cream (MYCOSTATIN)    Sig: Apply 1 application topically 2 (two) times daily. X  2    Dispense:  30 g    Refill:  0    No orders of the defined types were placed in this encounter.    Follow-up: No follow-ups on file.  An After Visit Summary was printed and given to the patient.  Rochel Brome, MD [facility] [phone]

## 2021-07-30 ENCOUNTER — Other Ambulatory Visit: Payer: Self-pay

## 2021-07-30 ENCOUNTER — Ambulatory Visit (INDEPENDENT_AMBULATORY_CARE_PROVIDER_SITE_OTHER): Payer: Medicare Other | Admitting: Family Medicine

## 2021-07-30 VITALS — BP 110/78 | HR 102 | Temp 97.2°F | Resp 16 | Wt 203.4 lb

## 2021-07-30 DIAGNOSIS — B379 Candidiasis, unspecified: Secondary | ICD-10-CM

## 2021-07-30 MED ORDER — NYSTATIN 100000 UNIT/GM EX CREA: 1.0000 "application " | TOPICAL_CREAM | Freq: Two times a day (BID) | CUTANEOUS | 0 refills | Status: DC

## 2021-07-30 NOTE — Patient Instructions (Signed)
Nystatin cream one twice a day for one week.

## 2021-08-02 ENCOUNTER — Ambulatory Visit (INDEPENDENT_AMBULATORY_CARE_PROVIDER_SITE_OTHER): Payer: Medicare Other | Admitting: Nurse Practitioner

## 2021-08-02 ENCOUNTER — Encounter: Payer: Self-pay | Admitting: Nurse Practitioner

## 2021-08-02 VITALS — BP 124/78 | HR 88 | Temp 97.0°F | Ht 69.0 in | Wt 204.0 lb

## 2021-08-02 DIAGNOSIS — Z20822 Contact with and (suspected) exposure to covid-19: Secondary | ICD-10-CM | POA: Diagnosis not present

## 2021-08-02 DIAGNOSIS — R5383 Other fatigue: Secondary | ICD-10-CM | POA: Diagnosis not present

## 2021-08-02 DIAGNOSIS — Z8709 Personal history of other diseases of the respiratory system: Secondary | ICD-10-CM

## 2021-08-02 DIAGNOSIS — Z8701 Personal history of pneumonia (recurrent): Secondary | ICD-10-CM

## 2021-08-02 LAB — POC COVID19 BINAXNOW: SARS Coronavirus 2 Ag: NEGATIVE

## 2021-08-02 LAB — POCT INFLUENZA A/B
Influenza A, POC: NEGATIVE
Influenza B, POC: NEGATIVE

## 2021-08-02 MED ORDER — MOLNUPIRAVIR EUA 200MG CAPSULE: 4.0000 | ORAL_CAPSULE | Freq: Two times a day (BID) | ORAL | 0 refills | Status: AC

## 2021-08-02 NOTE — Progress Notes (Signed)
Acute Office Visit  Subjective:    Patient ID: [name], male    DOB: 10-09-1960, 61 y.o.   MRN: [id number]  Chief Complaint  Patient presents with   Covid Exposure    HPI: Patient is in today for lethargy. He is a resident of a local group home. He is accompanied by group home staff member, Otila Kluver. She tells me [name] began being lethargic yesterday, laid in bed more than usual.Tina stated [name] has chest congestion. Denies changes in oral intake or fever. A group home resident tested positive for COVID-19. [name] has a past history of aspiration pneumonia and respiratory failure. Past Medical History:  Diagnosis Date   Aspiration pneumonia of both upper lobes due to regurgitated food ([place]) 11/01/2019   Autism    Developmental non-verbal disorder    Essential hypertension    GERD without esophagitis    Mixed hyperlipidemia    Prediabetes    Seizure disorder ([place])    Severe intellectual disabilities     Past Surgical History:  Procedure Laterality Date   LAPAROTOMY  2021    No family history on file.  Social History   Socioeconomic History   Marital status: Single    Spouse name: Not on file   Number of children: Not on file   Years of education: Not on file   Highest education level: Not on file  Occupational History   Not on file  Tobacco Use   Smoking status: Never   Smokeless tobacco: Never  Substance and Sexual Activity   Alcohol use: Never   Drug use: Never   Sexual activity: Not on file  Other Topics Concern   Not on file  Social History Narrative   Not on file   Social Determinants of Health   Financial Resource Strain: Not on file  Food Insecurity: Not on file  Transportation Needs: Not on file  Physical Activity: Not on file  Stress: Not on file  Social Connections: Not on file  Intimate Partner Violence: Not on file    Outpatient Medications Prior to Visit  Medication Sig Dispense Refill   acetaminophen (TYLENOL) 500 MG tablet Take  500 mg by mouth every 6 (six) hours as needed.     amLODipine (NORVASC) 5 MG tablet Take 1 tablet (5 mg total) by mouth daily. 90 tablet 1   atorvastatin (LIPITOR) 20 MG tablet Take by mouth.     atorvastatin (LIPITOR) 20 MG tablet Take 1 tablet (20 mg total) by mouth daily. 90 tablet 1   bisacodyl (DULCOLAX) 10 MG suppository Place 10 mg rectally daily as needed for moderate constipation.     cephALEXin (KEFLEX) 500 MG capsule Take 1 capsule (500 mg total) by mouth 2 (two) times daily. 14 capsule 0   citalopram (CELEXA) 40 MG tablet TAKE ONE TABLET BY MOUTH EVERY MORNING 30 tablet 11   clonazePAM (KLONOPIN) 0.5 MG tablet Take 1 tablet every morning, 1 at noon, 1/2 at 4 pm and 1 at bedtime 105 tablet 3   docusate sodium (COLACE) 100 MG capsule Take 100 mg by mouth 2 (two) times daily as needed for mild constipation.     feeding supplement, ENSURE COMPLETE, (ENSURE COMPLETE) LIQD Take 237 mLs by mouth daily with breakfast. 7110 mL 11   lactulose (CHRONULAC) 10 GM/15ML solution GIVE 45 ML (30 GM) BY MOUTH 3 TIMES A DAY 1892 mL 10   loratadine (CLARITIN) 10 MG tablet Take 10 mg by mouth daily.  montelukast (SINGULAIR) 10 MG tablet Take 1 tablet (10 mg total) by mouth daily. 30 tablet 11   Multiple Vitamin (MULTIVITAMIN) tablet Take 1 tablet by mouth daily. 90 tablet 0   nystatin cream (MYCOSTATIN) Apply 1 application topically 2 (two) times daily. X  2 30 g 0   pantoprazole (PROTONIX) 40 MG tablet TAKE 1 TABLET BY MOUTH TWICE A DAY 60 tablet 11   polyethylene glycol (MIRALAX / GLYCOLAX) 17 g packet Take 17 g by mouth daily.     polyethylene glycol powder (MIRALAX) 17 GM/SCOOP powder Take 17 gm bid (noon and 6pm)     psyllium (METAMUCIL) 58.6 % packet Take 1 packet by mouth daily. 30 each 12   QUEtiapine (SEROQUEL) 200 MG tablet Take 200 mg by mouth. Take 1 1/2 every morning, 1 1/2 at noon , 1/2 at 4 pm and 1 at bedtime     sodium phosphate (FLEET) 7-19 GM/118ML ENEM Place 133 mLs (1 enema total)  rectally daily as needed for severe constipation. Dose 4.5 OZ Q72 H PRN. 133 mL 6   Starch, Thickening, LIQD 3 tsps per 4 oz of liquid. Recommend 12 oz liquid (water,tea, or juice) three times a day. 237 mL 11   valsartan (DIOVAN) 40 MG tablet TAKE 1 TABLET BY MOUTH EVERY DAY 30 tablet 11   No facility-administered medications prior to visit.    Allergies  Allergen Reactions   Augmentin [Amoxicillin-Pot Clavulanate]     Review of Systems  Unable to perform ROS: Patient nonverbal  Gastrointestinal:  Negative for vomiting.      Objective:    Physical Exam Vitals reviewed.  HENT:     Right Ear: Tympanic membrane normal.     Left Ear: Tympanic membrane normal.     Nose: No congestion or rhinorrhea.     Mouth/Throat:     Comments: Unable to observe, pt did not open mouth Cardiovascular:     Rate and Rhythm: Normal rate and regular rhythm.     Pulses: Normal pulses.     Heart sounds: Normal heart sounds.  Pulmonary:     Effort: Pulmonary effort is normal.     Breath sounds: Normal breath sounds.  Skin:    General: Skin is warm and dry.     Capillary Refill: Capillary refill takes less than 2 seconds.  Neurological:     Mental Status: He is alert. Mental status is at baseline.    BP 124/78    Pulse 88    Temp (!) 97 F (36.1 C)    Wt 204 lb (92.5 kg)    BMI 30.13 kg/m  Wt Readings from Last 3 Encounters:  08/02/21 204 lb (92.5 kg)  07/30/21 203 lb 6.4 oz (92.3 kg)  05/07/21 211 lb (95.7 kg)    Health Maintenance Due  Topic Date Due   COVID-19 Vaccine (1) Never done   Pneumococcal Vaccine 18-13 Years old (1 - PCV) Never done   HIV Screening  Never done   Hepatitis C Screening  Never done   TETANUS/TDAP  Never done   Zoster Vaccines- Shingrix (1 of 2) Never done    Lab Results  Component Value Date   TSH 1.160 11/01/2019   Lab Results  Component Value Date   WBC 7.8 04/11/2021   HGB 14.9 04/11/2021   HCT 44.8 04/11/2021   MCV 86 04/11/2021   PLT 285  04/11/2021   Lab Results  Component Value Date   NA 142 04/11/2021   K  4.4 04/11/2021   CO2 23 04/11/2021   GLUCOSE 99 04/11/2021   BUN 6 04/11/2021   CREATININE 0.84 04/11/2021   BILITOT 0.3 04/11/2021   ALKPHOS 117 04/11/2021   AST 19 04/11/2021   ALT 18 04/11/2021   PROT 7.2 04/11/2021   ALBUMIN 4.4 04/11/2021   CALCIUM 9.5 04/11/2021   EGFR 101 04/11/2021   Lab Results  Component Value Date   CHOL 177 04/11/2021   Lab Results  Component Value Date   HDL 32 (L) 04/11/2021   Lab Results  Component Value Date   LDLCALC 122 (H) 04/11/2021   Lab Results  Component Value Date   TRIG 127 04/11/2021   Lab Results  Component Value Date   CHOLHDL 5.5 (H) 04/11/2021   Lab Results  Component Value Date   HGBA1C 6.1 (H) 04/11/2021       Assessment & Plan:   1. Exposure to COVID-19 virus - POC COVID-19 BinaxNow-NEGATIVE - molnupiravir EUA (LAGEVRIO) 200 mg CAPS capsule; Take 4 capsules (800 mg total) by mouth 2 (two) times daily for 5 days.  Dispense: 40 capsule; Refill: 0 - DG Chest 2 View; Future  2. Lethargy - POCT Influenza A/B-NEGATIVE  3. History of aspiration pneumonia - molnupiravir EUA (LAGEVRIO) 200 mg CAPS capsule; Take 4 capsules (800 mg total) by mouth 2 (two) times daily for 5 days.  Dispense: 40 capsule; Refill: 0 - DG Chest 2 View; Future (written requisition given to have outpatient [facility] x[facility]ray at [facility])   4. History of respiratory failure - molnupiravir EUA (LAGEVRIO) 200 mg CAPS capsule; Take 4 capsules (800 mg total) by mouth 2 (two) times daily for 5 days.  Dispense: 40 capsule; Refill: 0 - DG Chest 2 View; Future     Encourage fluids Monitor for worsening of symptoms Seek emergency medical care if symptoms become severe Notify office if symptoms worsen or fail to improve Follow-up as needed     Follow-up: PRN  An After Visit Summary was printed and given to the patient.  I, Rip Harbour, NP, have reviewed all  documentation for this visit. The documentation on 08/02/21 for the exam, diagnosis, procedures, and orders are all accurate and complete.    Signed, Rip Harbour, NP [facility] [phone]

## 2021-08-12 ENCOUNTER — Encounter: Payer: Self-pay | Admitting: Family Medicine

## 2021-08-12 DIAGNOSIS — B379 Candidiasis, unspecified: Secondary | ICD-10-CM | POA: Insufficient documentation

## 2021-08-12 NOTE — Assessment & Plan Note (Signed)
-  Nystatin cream

## 2021-08-21 ENCOUNTER — Other Ambulatory Visit: Payer: Self-pay

## 2021-08-21 ENCOUNTER — Ambulatory Visit (INDEPENDENT_AMBULATORY_CARE_PROVIDER_SITE_OTHER): Payer: Medicare Other

## 2021-08-21 VITALS — HR 102 | Temp 97.3°F | Resp 18 | Ht 69.0 in | Wt 201.1 lb

## 2021-08-21 DIAGNOSIS — Z0000 Encounter for general adult medical examination without abnormal findings: Secondary | ICD-10-CM | POA: Diagnosis not present

## 2021-08-21 DIAGNOSIS — Z6829 Body mass index (BMI) 29.0-29.9, adult: Secondary | ICD-10-CM

## 2021-08-27 ENCOUNTER — Other Ambulatory Visit: Payer: Self-pay | Admitting: Family Medicine

## 2021-08-27 ENCOUNTER — Telehealth: Payer: Self-pay

## 2021-08-27 MED ORDER — BENEFIBER DRINK MIX PO PACK: 1.0000 | PACK | Freq: Every day | ORAL | 11 refills | Status: DC

## 2021-08-27 NOTE — Telephone Encounter (Addendum)
Caregiver came into office as patient has not been able to receive his metamucil since 12/26. The pharmacy was sending a carton of metamucil but they are not able to get this in. They request an alternative. Caregiver stated the packets they would be able to get.   Caregiver #: [phone]  Royce Macadamia, [place] 08/27/21 10:17 AM

## 2021-08-27 NOTE — Telephone Encounter (Signed)
Left detailed message.   [name] 08/27/21 4:42 PM

## 2021-08-28 NOTE — Patient Instructions (Signed)
Fall Prevention in the Home, Adult Falls can cause injuries and can happen to people of all ages. There are many things you can do to make your home safe and to help prevent falls. Ask for help when making these changes. What actions can I take to prevent falls? General Instructions Use good lighting in all rooms. Replace any light bulbs that burn out. Turn on the lights in dark areas. Use night-lights. Keep items that you use often in easy-to-reach places. Lower the shelves around your home if needed. Set up your furniture so you have a clear path. Avoid moving your furniture around. Do not have throw rugs or other things on the floor that can make you trip. Avoid walking on wet floors. If any of your floors are uneven, fix them. Add color or contrast paint or tape to clearly mark and help you see: Grab bars or handrails. First and last steps of staircases. Where the edge of each step is. If you use a stepladder: Make sure that it is fully opened. Do not climb a closed stepladder. Make sure the sides of the stepladder are locked in place. Ask someone to hold the stepladder while you use it. Know where your pets are when moving through your home. What can I do in the bathroom?   Keep the floor dry. Clean up any water on the floor right away. Remove soap buildup in the tub or shower. Use nonskid mats or decals on the floor of the tub or shower. Attach bath mats securely with double-sided, nonslip rug tape. If you need to sit down in the shower, use a plastic, nonslip stool. Install grab bars by the toilet and in the tub and shower. Do not use towel bars as grab bars. What can I do in the bedroom? Make sure that you have a light by your bed that is easy to reach. Do not use any sheets or blankets for your bed that hang to the floor. Have a firm chair with side arms that you can use for support when you get dressed. What can I do in the kitchen? Clean up any spills right away. If you  need to reach something above you, use a step stool with a grab bar. Keep electrical cords out of the way. Do not use floor polish or wax that makes floors slippery. What can I do with my stairs? Do not leave any items on the stairs. Make sure that you have a light switch at the top and the bottom of the stairs. Make sure that there are handrails on both sides of the stairs. Fix handrails that are broken or loose. Install nonslip stair treads on all your stairs. Avoid having throw rugs at the top or bottom of the stairs. Choose a carpet that does not hide the edge of the steps on the stairs. Check carpeting to make sure that it is firmly attached to the stairs. Fix carpet that is loose or worn. What can I do on the outside of my home? Use bright outdoor lighting. Fix the edges of walkways and driveways and fix any cracks. Remove anything that might make you trip as you walk through a door, such as a raised step or threshold. Trim any bushes or trees on paths to your home. Check to see if handrails are loose or broken and that both sides of all steps have handrails. Install guardrails along the edges of any raised decks and porches. Clear paths of anything that can  make you trip, such as tools or rocks. Have leaves, snow, or ice cleared regularly. Use sand or salt on paths during winter. Clean up any spills in your garage right away. This includes grease or oil spills. What other actions can I take? Wear shoes that: Have a low heel. Do not wear high heels. Have rubber bottoms. Feel good on your feet and fit well. Are closed at the toe. Do not wear open-toe sandals. Use tools that help you move around if needed. These include: Canes. Walkers. Scooters. Crutches. Review your medicines with your doctor. Some medicines can make you feel dizzy. This can increase your chance of falling. Ask your doctor what else you can do to help prevent falls. Where to find more information Centers for  Disease Control and Prevention, [place]: http://www.wolf.info/ National Institute on Aging: http://kim-miller.com/ Contact a doctor if: You are afraid of falling at home. You feel weak, drowsy, or dizzy at home. You fall at home. Summary There are many simple things that you can do to make your home safe and to help prevent falls. Ways to make your home safe include removing things that can make you trip and installing grab bars in the bathroom. Ask for help when making these changes in your home. This information is not intended to replace advice given to you by your health care provider. Make sure you discuss any questions you have with your health care provider. Document Revised: 02/15/2020 Document Reviewed: 02/15/2020 Elsevier Patient Education  [place] Maintenance, Male Adopting a healthy lifestyle and getting preventive care are important in promoting health and wellness. Ask your health care provider about: The right schedule for you to have regular tests and exams. Things you can do on your own to prevent diseases and keep yourself healthy. What should I know about diet, weight, and exercise? Eat a healthy diet  Eat a diet that includes plenty of vegetables, fruits, low-fat dairy products, and lean protein. Do not eat a lot of foods that are high in solid fats, added sugars, or sodium. Maintain a healthy weight Body mass index (BMI) is a measurement that can be used to identify possible weight problems. It estimates body fat based on height and weight. Your health care provider can help determine your BMI and help you achieve or maintain a healthy weight. Get regular exercise Get regular exercise. This is one of the most important things you can do for your health. Most adults should: Exercise for at least 150 minutes each week. The exercise should increase your heart rate and make you sweat (moderate-intensity exercise). Do strengthening exercises at least twice a week. This  is in addition to the moderate-intensity exercise. Spend less time sitting. Even light physical activity can be beneficial. Watch cholesterol and blood lipids Have your blood tested for lipids and cholesterol at 60 years of age, then have this test every 5 years. You may need to have your cholesterol levels checked more often if: Your lipid or cholesterol levels are high. You are older than 60 years of age. You are at high risk for heart disease. What should I know about cancer screening? Many types of cancers can be detected early and may often be prevented. Depending on your health history and family history, you may need to have cancer screening at various ages. This may include screening for: Colorectal cancer. Prostate cancer. Skin cancer. Lung cancer. What should I know about heart disease, diabetes, and high blood pressure? Blood pressure and  heart disease High blood pressure causes heart disease and increases the risk of stroke. This is more likely to develop in people who have high blood pressure readings or are overweight. Talk with your health care provider about your target blood pressure readings. Have your blood pressure checked: Every 3-5 years if you are 54-37 years of age. Every year if you are 74 years old or older. If you are between the ages of 62 and 41 and are a current or former smoker, ask your health care provider if you should have a one-time screening for abdominal aortic aneurysm (AAA). Diabetes Have regular diabetes screenings. This checks your fasting blood sugar level. Have the screening done: Once every three years after age 21 if you are at a normal weight and have a low risk for diabetes. More often and at a younger age if you are overweight or have a high risk for diabetes. What should I know about preventing infection? Hepatitis B If you have a higher risk for hepatitis B, you should be screened for this virus. Talk with your health care provider to  find out if you are at risk for hepatitis B infection. Hepatitis C Blood testing is recommended for: Everyone born from [birth year] through 1964. Anyone with known risk factors for hepatitis C. Sexually transmitted infections (STIs) You should be screened each year for STIs, including gonorrhea and chlamydia, if: You are sexually active and are younger than 60 years of age. You are older than 60 years of age and your health care provider tells you that you are at risk for this type of infection. Your sexual activity has changed since you were last screened, and you are at increased risk for chlamydia or gonorrhea. Ask your health care provider if you are at risk. Ask your health care provider about whether you are at high risk for HIV. Your health care provider may recommend a prescription medicine to help prevent HIV infection. If you choose to take medicine to prevent HIV, you should first get tested for HIV. You should then be tested every 3 months for as long as you are taking the medicine. Follow these instructions at home: Alcohol use Do not drink alcohol if your health care provider tells you not to drink. If you drink alcohol: Limit how much you have to 0-2 drinks a day. Know how much alcohol is in your drink. In the [place] one drink equals one 12 oz bottle of beer (355 mL), one 5 oz glass of wine (148 mL), or one 1 oz glass of hard liquor (44 mL). Lifestyle Do not use any products that contain nicotine or tobacco. These products include cigarettes, chewing tobacco, and vaping devices, such as e-cigarettes. If you need help quitting, ask your health care provider. Do not use street drugs. Do not share needles. Ask your health care provider for help if you need support or information about quitting drugs. General instructions Schedule regular health, dental, and eye exams. Stay current with your vaccines. Tell your health care provider if: You often feel depressed. You have ever been  abused or do not feel safe at home. Summary Adopting a healthy lifestyle and getting preventive care are important in promoting health and wellness. Follow your health care provider's instructions about healthy diet, exercising, and getting tested or screened for diseases. Follow your health care provider's instructions on monitoring your cholesterol and blood pressure. This information is not intended to replace advice given to you by your health care provider. Make  sure you discuss any questions you have with your health care provider. Document Revised: 12/03/2020 Document Reviewed: 12/03/2020 Elsevier Patient Education  [place].

## 2021-08-28 NOTE — Progress Notes (Signed)
Subjective:   [name] is a 61 y.o. male who presents for Medicare Annual/Subsequent preventive examination.  This wellness visit is conducted by a nurse.  The patient's medications were reviewed and reconciled since the patient's last visit.  History details were provided by the patient's group home caregiver.  The history appears to be reliable.    Patient's last AWV was one year ago.   Medical History: Patient history and Family history was reviewed  Medications, Allergies, and preventative health maintenance was reviewed and updated.   Review of Systems    Review of Systems  Constitutional: Negative.   HENT: Negative.    Gastrointestinal:  Positive for constipation.   Cardiac Risk Factors include: advanced age (>63men, >73 women);male gender     Objective:    Today's Vitals   08/21/21 1110  Pulse: (!) 102  Resp: 18  Temp: (!) 97.3 F (36.3 C)  TempSrc: Temporal  SpO2: 93%  Weight: 201 lb 1.6 oz (91.2 kg)  Height: 5\' 9"  (1.753 m)  PainSc: 0-No pain   Body mass index is 29.7 kg/m.  Advanced Directives 08/21/2021  Does Patient Have a Medical Advance Directive? No    Current Medications (verified) Outpatient Encounter Medications as of 08/21/2021  Medication Sig   acetaminophen (TYLENOL) 500 MG tablet Take 500 mg by mouth every 6 (six) hours as needed.   amLODipine (NORVASC) 5 MG tablet Take 1 tablet (5 mg total) by mouth daily.   atorvastatin (LIPITOR) 20 MG tablet Take 1 tablet (20 mg total) by mouth daily.   bisacodyl (DULCOLAX) 10 MG suppository Place 10 mg rectally daily as needed for moderate constipation.   citalopram (CELEXA) 40 MG tablet TAKE ONE TABLET BY MOUTH EVERY MORNING   clonazePAM (KLONOPIN) 0.5 MG tablet Take 1 tablet every morning, 1 at noon, 1/2 at 4 pm and 1 at bedtime   docusate sodium (COLACE) 100 MG capsule Take 100 mg by mouth 2 (two) times daily as needed for mild constipation.   feeding supplement, ENSURE COMPLETE, (ENSURE COMPLETE)  LIQD Take 237 mLs by mouth daily with breakfast.   lactulose (CHRONULAC) 10 GM/15ML solution GIVE 45 ML (30 GM) BY MOUTH 3 TIMES A DAY   loratadine (CLARITIN) 10 MG tablet Take 10 mg by mouth daily.   montelukast (SINGULAIR) 10 MG tablet Take 1 tablet (10 mg total) by mouth daily.   Multiple Vitamin (MULTIVITAMIN) tablet Take 1 tablet by mouth daily.   nystatin cream (MYCOSTATIN) Apply 1 application topically 2 (two) times daily. X  2   pantoprazole (PROTONIX) 40 MG tablet TAKE 1 TABLET BY MOUTH TWICE A DAY   polyethylene glycol (MIRALAX / GLYCOLAX) 17 g packet Take 17 g by mouth daily.   polyethylene glycol powder (GLYCOLAX/MIRALAX) 17 GM/SCOOP powder Take 17 gm bid (noon and 6pm)   psyllium (METAMUCIL) 58.6 % packet Take 1 packet by mouth daily.   QUEtiapine (SEROQUEL) 200 MG tablet Take 200 mg by mouth. Take 1 1/2 every morning, 1 1/2 at noon , 1/2 at 4 pm and 1 at bedtime   sodium phosphate (FLEET) 7-19 GM/118ML ENEM Place 133 mLs (1 enema total) rectally daily as needed for severe constipation. Dose 4.5 OZ Q72 H PRN.   Starch, Thickening, LIQD 3 tsps per 4 oz of liquid. Recommend 12 oz liquid (water,tea, or juice) three times a day.   valsartan (DIOVAN) 40 MG tablet TAKE 1 TABLET BY MOUTH EVERY DAY   [DISCONTINUED] atorvastatin (LIPITOR) 20 MG tablet Take by  mouth.   [DISCONTINUED] cephALEXin (KEFLEX) 500 MG capsule Take 1 capsule (500 mg total) by mouth 2 (two) times daily.   No facility-administered encounter medications on file as of 08/21/2021.    Allergies (verified) Augmentin [amoxicillin-pot clavulanate]   History: Past Medical History:  Diagnosis Date   Aspiration pneumonia of both upper lobes due to regurgitated food ([place]) 11/01/2019   Autism    Developmental non-verbal disorder    Essential hypertension    GERD without esophagitis    Mixed hyperlipidemia    Prediabetes    Seizure disorder ([place])    Severe intellectual disabilities    Past Surgical History:  Procedure  Laterality Date   LAPAROTOMY  2021   No family history on file. Social History   Socioeconomic History   Marital status: Single   Number of children: 0   Years of education: Not on file   Highest education level: Not on file  Occupational History   Not on file  Tobacco Use   Smoking status: Never   Smokeless tobacco: Never  Substance and Sexual Activity   Alcohol use: Never   Drug use: Never   Sexual activity: Not on file  Other Topics Concern   Not on file  Social History Narrative   Not on file   Social Determinants of Health   Financial Resource Strain: Not on file  Food Insecurity: Not on file  Transportation Needs: Not on file  Physical Activity: Not on file  Stress: Not on file  Social Connections: Not on file    Tobacco Counseling Counseling given: Patient has never used tobacco products   Clinical Intake:  Pre-visit preparation completed: Yes Pain : Faces Pain Score: 0-No pain Faces Pain Scale: No hurt Faces Pain Scale: No hurt BMI - recorded: 29.7 Nutritional Status: BMI 25 -29 Overweight Nutritional Risks: None Diabetes: No How often do you need to have someone help you when you read instructions, pamphlets, or other written materials from your doctor or pharmacy?: 5 - Always  Activities of Daily Living In your present state of health, do you have any difficulty performing the following activities: 08/21/2021  Hearing? N  Vision? N  Difficulty concentrating or making decisions? Y  Walking or climbing stairs? N  Dressing or bathing? Y  Doing errands, shopping? Y  Preparing Food and eating ? Y  Using the Toilet? Y  In the past six months, have you accidently leaked urine? Y  Do you have problems with loss of bowel control? N  Managing your Medications? Y  Managing your Finances? Y  Housekeeping or managing your Housekeeping? Y  Some recent data might be hidden    Patient Care Team: Rochel Brome, MD as PCP - General (Family  Medicine) Evelina Bucy, DPM as Consulting Physician (Podiatry)     Assessment:   This is a routine wellness examination for [name].  Hearing/Vision screen No results found.  Dietary issues and exercise activities discussed: Current Exercise Habits: Home exercise routine, Type of exercise: walking   Depression Screen PHQ 2/9 Scores 08/21/2021 03/26/2020  PHQ - 2 Score - 0  Exception Documentation Other- indicate reason in comment box -  Not completed patient unable to answer -    Fall Risk Fall Risk  08/21/2021 03/26/2020  Falls in the past year? 0 0  Number falls in past yr: 0 0  Injury with Fall? 0 0  Risk for fall due to : No Fall Risks -  Follow up Falls evaluation completed;Falls prevention  discussed -    FALL RISK PREVENTION PERTAINING TO THE HOME:  Any stairs in or around the home? No  If so, are there any without handrails? No  Home free of loose throw rugs in walkways, pet beds, electrical cords, etc? Yes  Adequate lighting in your home to reduce risk of falls? Yes   ASSISTIVE DEVICES UTILIZED TO PREVENT FALLS:  Use of a cane, walker or w/c? No  Grab bars in the bathroom? Yes  Shower chair or bench in shower? Yes   Gait slow and steady without use of assistive device  Cognitive Function:        Immunizations Immunization History  Administered Date(s) Administered   Influenza Inj Mdck Quad Pf 04/10/2021   Influenza,inj,Quad PF,6+ Mos 03/26/2020   Moderna Sars-Covid-2 Vaccination 08/18/2019   PPD Test 03/26/2020   Pneumococcal Polysaccharide-23 05/09/2008   Tdap 05/09/2008    TDAP status: Up to date  Flu Vaccine status: Up to date  Pneumococcal vaccine status: Up to date  Covid-19 vaccine status: Declined, Education has been provided regarding the importance of this vaccine but patient still declined. Advised may receive this vaccine at local pharmacy or Health Dept.or vaccine clinic. Aware to provide a copy of the vaccination record if obtained  from local pharmacy or Health Dept. Verbalized acceptance and understanding.  Screening Tests Health Maintenance  Topic Date Due   Hepatitis C Screening  Never done   Zoster Vaccines- Shingrix (1 of 2) Never done   TETANUS/TDAP  05/09/2018   COVID-19 Vaccine (2 - Moderna risk series) 09/15/2019   COLONOSCOPY (Pts 45-35yrs Insurance coverage will need to be confirmed)  03/08/2024   INFLUENZA VACCINE  Completed   HPV VACCINES  Aged Out   HIV Screening  Discontinued    Health Maintenance  Health Maintenance Due  Topic Date Due   Hepatitis C Screening  Never done   Zoster Vaccines- Shingrix (1 of 2) Never done   TETANUS/TDAP  05/09/2018   COVID-19 Vaccine (2 - Moderna risk series) 09/15/2019    Colorectal cancer screening: Type of screening: Colonoscopy. Completed 2015. Repeat every 10 years  Lung Cancer Screening: (Low Dose CT Chest recommended if Age 14-80 years, 30 pack-year currently smoking OR have quit w/in 15years.) does not qualify.   Additional Screening:  Vision Screening: Recommended annual ophthalmology exams for early detection of glaucoma and other disorders of the eye. Is the patient up to date with their annual eye exam?  Yes   Dental Screening: Recommended annual dental exams for proper oral hygiene     Plan:    1- Continue healthy diet and exercise 2- COVID Booster recommended - patient is at high risk in group home setting  I have personally reviewed and noted the following in the patients chart:   Medical and social history Use of alcohol, tobacco or illicit drugs  Current medications and supplements including opioid prescriptions.  Functional ability and status Nutritional status Physical activity Advanced directives List of other physicians Hospitalizations, surgeries, and ER visits in previous 12 months Vitals Screenings to include cognitive, depression, and falls Referrals and appointments  In addition, I have reviewed and discussed with  patient certain preventive protocols, quality metrics, and best practice recommendations. A written personalized care plan for preventive services as well as general preventive health recommendations were provided to patient.     Erie Noe, LPN   0/08/7251

## 2021-09-02 ENCOUNTER — Other Ambulatory Visit: Payer: Self-pay | Admitting: Family Medicine

## 2021-09-03 ENCOUNTER — Other Ambulatory Visit: Payer: Self-pay

## 2021-09-03 ENCOUNTER — Encounter: Payer: Self-pay | Admitting: Family Medicine

## 2021-09-03 ENCOUNTER — Ambulatory Visit (INDEPENDENT_AMBULATORY_CARE_PROVIDER_SITE_OTHER): Payer: Medicare Other | Admitting: Family Medicine

## 2021-09-03 VITALS — BP 110/72 | HR 72 | Temp 97.1°F | Resp 16 | Ht 69.0 in | Wt 195.0 lb

## 2021-09-03 DIAGNOSIS — R3981 Functional urinary incontinence: Secondary | ICD-10-CM

## 2021-09-03 DIAGNOSIS — H6122 Impacted cerumen, left ear: Secondary | ICD-10-CM | POA: Insufficient documentation

## 2021-09-03 DIAGNOSIS — K219 Gastro-esophageal reflux disease without esophagitis: Secondary | ICD-10-CM | POA: Diagnosis not present

## 2021-09-03 DIAGNOSIS — I10 Essential (primary) hypertension: Secondary | ICD-10-CM

## 2021-09-03 DIAGNOSIS — R7303 Prediabetes: Secondary | ICD-10-CM

## 2021-09-03 DIAGNOSIS — F72 Severe intellectual disabilities: Secondary | ICD-10-CM

## 2021-09-03 DIAGNOSIS — E782 Mixed hyperlipidemia: Secondary | ICD-10-CM | POA: Diagnosis not present

## 2021-09-03 DIAGNOSIS — K5904 Chronic idiopathic constipation: Secondary | ICD-10-CM

## 2021-09-03 DIAGNOSIS — F840 Autistic disorder: Secondary | ICD-10-CM

## 2021-09-03 DIAGNOSIS — F330 Major depressive disorder, recurrent, mild: Secondary | ICD-10-CM

## 2021-09-03 NOTE — Progress Notes (Signed)
Subjective:  Patient ID: [name], male    DOB: [birth year]  Age: 61 y.o. MRN: [id number]  Chief Complaint  Patient presents with   [name] out physical paperwork    HPI Patient is a 61 yo severely disabled WM with pmhx of hypertension, hyperlipidemia, constipation, gerd, and mild recurrent depression. Patient presents for physical exam. He had his AWV with Gerald Iron LPN 2 weeks ago. He needs FL2 form filled out. I reviewed his visit with [name].  Patient sees Dr. Clovis Webster for mental disability, autism spectrum, and depression. Patient lives in group home. ON celexa, clonazepam, and seroquel Patient has bladder and bowel incontinence. Wears depends.  Hyperlpidemia: Not quite at goal, patient was supposed to increase to lipitor 40 mg daily at last visit, but appears to be on 20 mg daily. Patient eats anything. Walks. Hypertension: on amlodipine 5 mg daily.  CIC: on miralax daily. Has dulcolax and colace as needed.  GERD: on protoniz 40 mg twice daily. Also has liquids thickened due to history of aspiration pneumonia.    Current Outpatient Medications on File Prior to Visit  Medication Sig Dispense Refill   acetaminophen (TYLENOL) 500 MG tablet Take 500 mg by mouth every 6 (six) hours as needed.     amLODipine (NORVASC) 5 MG tablet TAKE 1 TABLET BY MOUTH ONCE DAILY 30 tablet 11   atorvastatin (LIPITOR) 20 MG tablet Take 1 tablet (20 mg total) by mouth daily. 90 tablet 1   bisacodyl (DULCOLAX) 10 MG suppository Place 10 mg rectally daily as needed for moderate constipation.     citalopram (CELEXA) 40 MG tablet TAKE ONE TABLET BY MOUTH EVERY MORNING 30 tablet 11   clonazePAM (KLONOPIN) 0.5 MG tablet Take 1 tablet every morning, 1 at noon, 1/2 at 4 pm and 1 at bedtime 105 tablet 3   docusate sodium (COLACE) 100 MG capsule Take 100 mg by mouth 2 (two) times daily as needed for mild constipation.     feeding supplement, ENSURE COMPLETE, (ENSURE COMPLETE) LIQD Take 237 mLs by mouth daily with  breakfast. 7110 mL 11   lactulose (CHRONULAC) 10 GM/15ML solution GIVE 45 ML (30 GM) BY MOUTH 3 TIMES A DAY 1892 mL 10   loratadine (CLARITIN) 10 MG tablet Take 10 mg by mouth daily.     montelukast (SINGULAIR) 10 MG tablet Take 1 tablet (10 mg total) by mouth daily. 30 tablet 11   Multiple Vitamin (MULTIVITAMIN) tablet Take 1 tablet by mouth daily. 90 tablet 0   nystatin cream (MYCOSTATIN) Apply 1 application topically 2 (two) times daily. X  2 30 g 0   pantoprazole (PROTONIX) 40 MG tablet TAKE 1 TABLET BY MOUTH TWICE A DAY 60 tablet 11   polyethylene glycol (MIRALAX / GLYCOLAX) 17 g packet Take 17 g by mouth daily.     polyethylene glycol powder (GLYCOLAX/MIRALAX) 17 GM/SCOOP powder Take 17 gm bid (noon and 6pm)     psyllium (METAMUCIL) 58.6 % packet Take 1 packet by mouth daily. 30 each 12   QUEtiapine (SEROQUEL) 200 MG tablet Take 200 mg by mouth. Take 1 1/2 every morning, 1 1/2 at noon , 1/2 at 4 pm and 1 at bedtime     sodium phosphate (FLEET) 7-19 GM/118ML ENEM Place 133 mLs (1 enema total) rectally daily as needed for severe constipation. Dose 4.5 OZ Q72 H PRN. 133 mL 6   Starch, Thickening, LIQD 3 tsps per 4 oz of liquid. Recommend 12 oz liquid (water,tea, or juice) three  times a day. 237 mL 11   valsartan (DIOVAN) 40 MG tablet TAKE 1 TABLET BY MOUTH EVERY DAY 30 tablet 11   Wheat Dextrin (BENEFIBER DRINK MIX) PACK Take 1 each by mouth daily. 28 each 11   No current facility-administered medications on file prior to visit.   Past Medical History:  Diagnosis Date   Aspiration pneumonia of both upper lobes due to regurgitated food ([place]) 11/01/2019   Autism    Developmental non-verbal disorder    Essential hypertension    GERD without esophagitis    Mixed hyperlipidemia    Prediabetes    Seizure disorder ([place])    Severe intellectual disabilities    Past Surgical History:  Procedure Laterality Date   LAPAROTOMY  2021    History reviewed. No pertinent family history. Social  History   Socioeconomic History   Marital status: Single    Spouse name: Not on file   Number of children: Not on file   Years of education: Not on file   Highest education level: Not on file  Occupational History   Not on file  Tobacco Use   Smoking status: Never   Smokeless tobacco: Never  Substance and Sexual Activity   Alcohol use: Never   Drug use: Never   Sexual activity: Not on file  Other Topics Concern   Not on file  Social History Narrative   Not on file   Social Determinants of Health   Financial Resource Strain: Not on file  Food Insecurity: Not on file  Transportation Needs: Not on file  Physical Activity: Not on file  Stress: Not on file  Social Connections: Not on file    Review of Systems  Constitutional:  Negative for chills, fatigue and fever.  HENT:  Positive for ear pain (holding left ear.). Negative for congestion and sore throat.   Respiratory:  Negative for cough and shortness of breath.   Cardiovascular:  Negative for chest pain and leg swelling.  Gastrointestinal:  Negative for abdominal pain, constipation, diarrhea, nausea and vomiting.  Genitourinary:  Negative for dysuria and frequency.  Musculoskeletal:  Negative for arthralgias and myalgias.  Neurological:  Negative for dizziness and headaches.  Psychiatric/Behavioral:  Negative for dysphoric mood. The patient is not nervous/anxious.     Objective:  BP 110/72 (BP Location: Right Arm, Patient Position: Sitting)    Pulse 72    Temp (!) 97.1 F (36.2 C) (Temporal)    Resp 16    Ht 5\' 9"  (1.753 m)    Wt 195 lb (88.5 kg)    SpO2 95%    BMI 28.80 kg/m   BP/Weight 09/03/2021 04/17/1940 01/28/813  Systolic BP 481 - 856  Diastolic BP 72 - 78  Wt. (Lbs) 195 201.1 204  BMI 28.8 29.7 30.13    Physical Exam Vitals reviewed.  Constitutional:      Appearance: He is normal weight.     Comments: Patient is more calm then usual. This is usually and indication of him not feeling well. He usually  moves around a lot and makes repetitive noises.  HENT:     Right Ear: There is no impacted cerumen.     Left Ear: There is impacted cerumen.     Nose: No congestion or rhinorrhea.     Mouth/Throat:     Pharynx: No oropharyngeal exudate or posterior oropharyngeal erythema.  Eyes:     Conjunctiva/sclera: Conjunctivae normal.  Neck:     Vascular: No carotid bruit.  Cardiovascular:  Rate and Rhythm: Normal rate and regular rhythm.     Heart sounds: Normal heart sounds.  Pulmonary:     Effort: Pulmonary effort is normal.     Breath sounds: Normal breath sounds.  Abdominal:     General: Abdomen is flat. Bowel sounds are normal.     Palpations: Abdomen is soft.     Hernia: A hernia (ventral) is present.  Skin:    General: Skin is warm and dry.  Neurological:     Mental Status: He is alert.     Coordination: Coordination abnormal.     Gait: Gait abnormal.     Comments: Nonverbal. Makes noises.   Psychiatric:        Mood and Affect: Mood normal.        Behavior: Behavior normal.    Diabetic Foot Exam - Simple   No data filed      Lab Results  Component Value Date   WBC 6.0 09/03/2021   HGB 13.9 09/03/2021   HCT 43.0 09/03/2021   PLT 304 09/03/2021   GLUCOSE 97 09/03/2021   CHOL 177 04/11/2021   TRIG 127 04/11/2021   HDL 32 (L) 04/11/2021   LDLCALC 122 (H) 04/11/2021   ALT 14 09/03/2021   AST 19 09/03/2021   NA 142 09/03/2021   K 4.4 09/03/2021   CL 105 09/03/2021   CREATININE 0.73 (L) 09/03/2021   BUN 10 09/03/2021   CO2 23 09/03/2021   TSH 1.820 09/03/2021   INR 1.1 12/01/2008   HGBA1C 6.1 (H) 04/11/2021      Assessment & Plan:   Problem List Items Addressed This Visit       Cardiovascular and Mediastinum   Essential hypertension - Primary    The current medical regimen is effective;  continue present plan and medications. Check labs      Relevant Orders   CBC with Differential/Platelet (Completed)   Comprehensive metabolic panel (Completed)    TSH (Completed)     Digestive   Chronic idiopathic constipation (Chronic)    The current medical regimen is effective;  continue present plan and medications.       Gastroesophageal reflux disease without esophagitis    The current medical regimen is effective;  continue present plan and medications.         Nervous and Auditory   Hearing loss of left ear due to cerumen impaction    Resolved with ear irrigation performed by Ander Purpura, MA.        Other   Mixed hyperlipidemia    Not quite at goal, patient was supposed to increase to lipitor 40 mg daily at last visit, but appears to be on 20 mg daily.  Continue to work on eating a healthy diet and exercise.  Labs drawn today.        Autism    Sees Dr. Clovis Webster      Depression, major, recurrent, mild ([place])    The current medical regimen is effective;  continue present plan and medications. Management per specialist.        Prediabetes    Recommend continue to work on eating healthy diet and exercise.       Severe intellectual disabilities    Recommended continued care in group home.       Urinary incontinence due to cognitive impairment    Wears depends.     .  No orders of the defined types were placed in this encounter.   Orders Placed This Encounter  Procedures   CBC with Differential/Platelet   Comprehensive metabolic panel   TSH     Follow-up: Return in about 6 months (around 03/03/2022) for chronic fasting.  An After Visit Summary was printed and given to the patient.   I,Lauren M Auman,acting as a scribe for Rochel Brome, MD.,have documented all relevant documentation on the behalf of Rochel Brome, MD,as directed by  Rochel Brome, MD while in the presence of Rochel Brome, MD.    Rochel Brome, MD [place] [phone]

## 2021-09-04 LAB — COMPREHENSIVE METABOLIC PANEL
ALT: 14 IU/L (ref 0–44)
AST: 19 IU/L (ref 0–40)
Albumin/Globulin Ratio: 1.3 (ref 1.2–2.2)
Albumin: 3.9 g/dL (ref 3.8–4.9)
Alkaline Phosphatase: 90 IU/L (ref 44–121)
BUN/Creatinine Ratio: 14 (ref 9–20)
BUN: 10 mg/dL (ref 6–24)
Bilirubin Total: 0.2 mg/dL (ref 0.0–1.2)
CO2: 23 mmol/L (ref 20–29)
Calcium: 9.3 mg/dL (ref 8.7–10.2)
Chloride: 105 mmol/L (ref 96–106)
Creatinine, Ser: 0.73 mg/dL — ABNORMAL LOW (ref 0.76–1.27)
Globulin, Total: 3.1 g/dL (ref 1.5–4.5)
Glucose: 97 mg/dL (ref 70–99)
Potassium: 4.4 mmol/L (ref 3.5–5.2)
Sodium: 142 mmol/L (ref 134–144)
Total Protein: 7 g/dL (ref 6.0–8.5)
eGFR: 105 mL/min/{1.73_m2} (ref >59)

## 2021-09-04 LAB — CBC WITH DIFFERENTIAL/PLATELET
Basophils Absolute: 0.1 10*3/uL (ref 0.0–0.2)
Basos: 1 %
EOS (ABSOLUTE): 0.3 10*3/uL (ref 0.0–0.4)
Eos: 4 %
Hematocrit: 43 % (ref 37.5–51.0)
Hemoglobin: 13.9 g/dL (ref 13.0–17.7)
Immature Grans (Abs): 0 10*3/uL (ref 0.0–0.1)
Immature Granulocytes: 1 %
Lymphocytes Absolute: 2 10*3/uL (ref 0.7–3.1)
Lymphs: 34 %
MCH: 28.9 pg (ref 26.6–33.0)
MCHC: 32.3 g/dL (ref 31.5–35.7)
MCV: 89 fL (ref 79–97)
Monocytes Absolute: 0.7 10*3/uL (ref 0.1–0.9)
Monocytes: 11 %
Neutrophils Absolute: 3 10*3/uL (ref 1.4–7.0)
Neutrophils: 49 %
Platelets: 304 10*3/uL (ref 150–450)
RBC: 4.81 x10E6/uL (ref 4.14–5.80)
RDW: 13.2 % (ref 11.6–15.4)
WBC: 6 10*3/uL (ref 3.4–10.8)

## 2021-09-04 LAB — TSH: TSH: 1.82 u[IU]/mL (ref 0.450–4.500)

## 2021-09-04 NOTE — Assessment & Plan Note (Signed)
Recommend continue to work on eating healthy diet and exercise.  

## 2021-09-04 NOTE — Assessment & Plan Note (Signed)
The current medical regimen is effective;  continue present plan and medications. Management per specialist.   

## 2021-09-04 NOTE — Assessment & Plan Note (Signed)
Recommended continued care in group home.

## 2021-09-04 NOTE — Assessment & Plan Note (Signed)
The current medical regimen is effective;  continue present plan and medications.  

## 2021-09-04 NOTE — Assessment & Plan Note (Addendum)
The current medical regimen is effective;  continue present plan and medications. Check labs. 

## 2021-09-04 NOTE — Assessment & Plan Note (Signed)
Wears depends. ?

## 2021-09-04 NOTE — Assessment & Plan Note (Signed)
Sees Dr. Clovis Riley

## 2021-09-04 NOTE — Assessment & Plan Note (Signed)
Resolved with ear irrigation performed by Ander Purpura, MA.

## 2021-09-04 NOTE — Assessment & Plan Note (Signed)
Not quite at goal, patient was supposed to increase to lipitor 40 mg daily at last visit, but appears to be on 20 mg daily.  Continue to work on eating a healthy diet and exercise.  Labs drawn today.

## 2021-10-08 ENCOUNTER — Ambulatory Visit: Payer: Medicare Other | Admitting: Family Medicine

## 2021-10-10 DIAGNOSIS — R0602 Shortness of breath: Secondary | ICD-10-CM

## 2021-10-12 ENCOUNTER — Encounter: Payer: Self-pay | Admitting: Cardiology

## 2021-10-12 DIAGNOSIS — R0602 Shortness of breath: Secondary | ICD-10-CM

## 2021-10-21 ENCOUNTER — Inpatient Hospital Stay: Payer: Medicare Other | Admitting: Nurse Practitioner

## 2021-10-22 ENCOUNTER — Other Ambulatory Visit: Payer: Self-pay

## 2021-10-22 ENCOUNTER — Encounter: Payer: Self-pay | Admitting: Nurse Practitioner

## 2021-10-22 ENCOUNTER — Ambulatory Visit (INDEPENDENT_AMBULATORY_CARE_PROVIDER_SITE_OTHER): Payer: Medicare Other | Admitting: Nurse Practitioner

## 2021-10-22 VITALS — BP 112/70 | HR 98 | Temp 97.3°F | Resp 16 | Wt 197.0 lb

## 2021-10-22 DIAGNOSIS — E782 Mixed hyperlipidemia: Secondary | ICD-10-CM

## 2021-10-22 DIAGNOSIS — I10 Essential (primary) hypertension: Secondary | ICD-10-CM

## 2021-10-22 DIAGNOSIS — R7989 Other specified abnormal findings of blood chemistry: Secondary | ICD-10-CM

## 2021-10-22 DIAGNOSIS — J069 Acute upper respiratory infection, unspecified: Secondary | ICD-10-CM | POA: Diagnosis not present

## 2021-10-22 DIAGNOSIS — R778 Other specified abnormalities of plasma proteins: Secondary | ICD-10-CM

## 2021-10-22 NOTE — Progress Notes (Signed)
? ?Subjective:  ?Patient ID: [name], male    DOB: July 21, 1961  Age: 61 y.o. MRN: [id number] ? ?Chief Complaint  ?Patient presents with  ?? Hospitalization Follow-up  ? ? ?HPI ? [name] is a 61 year old Caucasian male that presents for hospital follow-up for elevated cardiac enzymes, elevated BNP, and URI. He is a resident of [facility]. He is accompanied by group home staff member,[place]. Pt has autism, mental disabilities, and is non-verbal. Medical record review revealed Troponin trended down prior to d/c home.  ?Follow up Hospitalization ? ?Patient was admitted to [facility] on 3/16 and discharged on 3/18. ?He was treated for Elevated troponin and BNP, Sinus Tachycardia, Dyslipidemia and HTN and acute URI. ?Caregiver reports this condition has improved ?Chest Ct was normal, Echocardiogram was insufficient, however stress test was not a option. ? ? ?Current Outpatient Medications on File Prior to Visit  ?Medication Sig Dispense Refill  ?? acetaminophen (TYLENOL) 500 MG tablet Take 500 mg by mouth every 6 (six) hours as needed.    ?? amLODipine (NORVASC) 5 MG tablet TAKE 1 TABLET BY MOUTH ONCE DAILY 30 tablet 11  ?? atorvastatin (LIPITOR) 20 MG tablet Take 1 tablet (20 mg total) by mouth daily. 90 tablet 1  ?? bisacodyl (DULCOLAX) 10 MG suppository Place 10 mg rectally daily as needed for moderate constipation.    ?? citalopram (CELEXA) 40 MG tablet TAKE ONE TABLET BY MOUTH EVERY MORNING 30 tablet 11  ?? clonazePAM (KLONOPIN) 0.5 MG tablet Take 1 tablet every morning, 1 at noon, 1/2 at 4 pm and 1 at bedtime 105 tablet 3  ?? docusate sodium (COLACE) 100 MG capsule Take 100 mg by mouth 2 (two) times daily as needed for mild constipation.    ?? feeding supplement, ENSURE COMPLETE, (ENSURE COMPLETE) LIQD Take 237 mLs by mouth daily with breakfast. 7110 mL 11  ?? lactulose (CHRONULAC) 10 GM/15ML solution GIVE 45 ML (30 GM) BY MOUTH 3 TIMES A DAY 1892 mL 10  ?? loratadine (CLARITIN) 10 MG tablet Take 10 mg by  mouth daily.    ?? montelukast (SINGULAIR) 10 MG tablet Take 1 tablet (10 mg total) by mouth daily. 30 tablet 11  ?? Multiple Vitamin (MULTIVITAMIN) tablet Take 1 tablet by mouth daily. 90 tablet 0  ?? nystatin cream (MYCOSTATIN) Apply 1 application topically 2 (two) times daily. X  2 30 g 0  ?? pantoprazole (PROTONIX) 40 MG tablet TAKE 1 TABLET BY MOUTH TWICE A DAY 60 tablet 11  ?? polyethylene glycol (MIRALAX / GLYCOLAX) 17 g packet Take 17 g by mouth daily.    ?? polyethylene glycol powder (GLYCOLAX/MIRALAX) 17 GM/SCOOP powder Take 17 gm bid (noon and 6pm)    ?? psyllium (METAMUCIL) 58.6 % packet Take 1 packet by mouth daily. 30 each 12  ?? QUEtiapine (SEROQUEL) 200 MG tablet Take 200 mg by mouth. Take 1 1/2 every morning, 1 1/2 at noon , 1/2 at 4 pm and 1 at bedtime    ?? sodium phosphate (FLEET) 7-19 GM/118ML ENEM Place 133 mLs (1 enema total) rectally daily as needed for severe constipation. Dose 4.5 OZ Q72 H PRN. 133 mL 6  ?? Starch, Thickening, LIQD 3 tsps per 4 oz of liquid. Recommend 12 oz liquid (water,tea, or juice) three times a day. 237 mL 11  ?? valsartan (DIOVAN) 40 MG tablet TAKE 1 TABLET BY MOUTH EVERY DAY 30 tablet 11  ?? Wheat Dextrin (BENEFIBER DRINK MIX) PACK Take 1 each by mouth daily. 28 each  11  ? ?No current facility-administered medications on file prior to visit.  ? ?Past Medical History:  ?Diagnosis Date  ?? Aspiration pneumonia of both upper lobes due to regurgitated food ([place]) 11/01/2019  ?? Autism   ?? Developmental non-verbal disorder   ?? Essential hypertension   ?? GERD without esophagitis   ?? Mixed hyperlipidemia   ?? Prediabetes   ?? Seizure disorder ([place])   ?? Severe intellectual disabilities   ? ?Past Surgical History:  ?Procedure Laterality Date  ?? LAPAROTOMY  2021  ?  ?No family history on file. ?Social History  ? ?Socioeconomic History  ?? Marital status: Single  ?  Spouse name: Not on file  ?? Number of children: Not on file  ?? Years of education: Not on file  ?? Highest  education level: Not on file  ?Occupational History  ?? Not on file  ?Tobacco Use  ?? Smoking status: Never  ?? Smokeless tobacco: Never  ?Substance and Sexual Activity  ?? Alcohol use: Never  ?? Drug use: Never  ?? Sexual activity: Not on file  ?Other Topics Concern  ?? Not on file  ?Social History Narrative  ?? Not on file  ? ?Social Determinants of Health  ? ?Financial Resource Strain: Not on file  ?Food Insecurity: Not on file  ?Transportation Needs: Not on file  ?Physical Activity: Not on file  ?Stress: Not on file  ?Social Connections: Not on file  ? ? ?Review of Systems  ?Unable to perform ROS: Patient nonverbal  ? ? ?Objective:  ?BP 112/70   Pulse 98   Temp (!) 97.3 ?F (36.3 ?C)   Resp 16   Wt 197 lb (89.4 kg)   SpO2 98%   BMI 29.09 kg/m?   ? ? ?  09/03/2021  ?  1:57 PM 08/21/2021  ? 11:10 AM 08/02/2021  ?  8:59 AM  ?BP/Weight  ?Systolic BP 809  983  ?Diastolic BP 72  78  ?Wt. (Lbs) 195 201.1 204  ?BMI 28.8 kg/m2 29.7 kg/m2 30.13 kg/m2  ? ? ?Physical Exam ?Vitals reviewed.  ?Cardiovascular:  ?   Rate and Rhythm: Normal rate.  ?   Pulses: Normal pulses.  ?   Heart sounds: Normal heart sounds.  ?Pulmonary:  ?   Effort: Pulmonary effort is normal.  ?   Breath sounds: Normal breath sounds.  ?Abdominal:  ?   General: Bowel sounds are normal.  ?   Palpations: Abdomen is soft.  ?Skin: ?   General: Skin is warm and dry.  ?   Capillary Refill: Capillary refill takes less than 2 seconds.  ?Neurological:  ?   Mental Status: He is alert. Mental status is at baseline.  ? ? ? ?  ? ?Lab Results  ?Component Value Date  ? WBC 6.0 09/03/2021  ? HGB 13.9 09/03/2021  ? HCT 43.0 09/03/2021  ? PLT 304 09/03/2021  ? GLUCOSE 97 09/03/2021  ? CHOL 177 04/11/2021  ? TRIG 127 04/11/2021  ? HDL 32 (L) 04/11/2021  ? LDLCALC 122 (H) 04/11/2021  ? ALT 14 09/03/2021  ? AST 19 09/03/2021  ? NA 142 09/03/2021  ? K 4.4 09/03/2021  ? CL 105 09/03/2021  ? CREATININE 0.73 (L) 09/03/2021  ? BUN 10 09/03/2021  ? CO2 23 09/03/2021  ? TSH 1.820  09/03/2021  ? INR 1.1 12/01/2008  ? HGBA1C 6.1 (H) 04/11/2021  ? ? ? ? ?Assessment & Plan:  ? ?1. Viral upper respiratory tract infection ? -continue medications and  treatment ? ?2. Elevated brain natriuretic peptide (BNP) level ?- Ambulatory referral to Cardiology ? ?3. Mixed hyperlipidemia ?- Ambulatory referral to Cardiology ? ?4. Essential hypertension ?- Ambulatory referral to Cardiology ? ?5. Elevated troponin ?- Ambulatory referral to Cardiology ?  ?Seek emergency medical care for any signs/symptoms of distress ?We will call you with lab results ?  ? ?Follow-up: 11/20/21 at 9:20 ? ?An After Visit Summary was printed and given to the patient. ? ?I, Rip Harbour, NP, have reviewed all documentation for this visit. The documentation on 10/22/21 for the exam, diagnosis, procedures, and orders are all accurate and complete.  ? ? ?Signed, ?Rip Harbour, NP ?[place] ?[phone]

## 2021-10-23 NOTE — Patient Instructions (Signed)
Notified [name] (with group home) that patient is being referred to cardiology. ?

## 2021-10-30 ENCOUNTER — Other Ambulatory Visit: Payer: Self-pay

## 2021-10-30 DIAGNOSIS — I10 Essential (primary) hypertension: Secondary | ICD-10-CM

## 2021-10-30 MED ORDER — VALSARTAN 40 MG PO TABS: 40.0000 mg | ORAL_TABLET | Freq: Every day | ORAL | 1 refills | Status: DC

## 2021-10-30 MED ORDER — ATORVASTATIN CALCIUM 20 MG PO TABS
20.0000 mg | ORAL_TABLET | Freq: Every day | ORAL | 1 refills | Status: DC
Start: 2021-10-30 — End: 2022-05-21

## 2021-11-20 ENCOUNTER — Ambulatory Visit (INDEPENDENT_AMBULATORY_CARE_PROVIDER_SITE_OTHER): Payer: Medicare Other | Admitting: Family Medicine

## 2021-11-20 ENCOUNTER — Encounter: Payer: Self-pay | Admitting: Family Medicine

## 2021-11-20 VITALS — BP 86/60 | HR 102 | Temp 96.9°F | Ht 69.0 in | Wt 188.0 lb

## 2021-11-20 DIAGNOSIS — I10 Essential (primary) hypertension: Secondary | ICD-10-CM | POA: Diagnosis not present

## 2021-11-20 DIAGNOSIS — I950 Idiopathic hypotension: Secondary | ICD-10-CM | POA: Diagnosis not present

## 2021-11-20 DIAGNOSIS — F72 Severe intellectual disabilities: Secondary | ICD-10-CM

## 2021-11-20 DIAGNOSIS — R7303 Prediabetes: Secondary | ICD-10-CM

## 2021-11-20 DIAGNOSIS — F840 Autistic disorder: Secondary | ICD-10-CM

## 2021-11-20 DIAGNOSIS — I959 Hypotension, unspecified: Secondary | ICD-10-CM

## 2021-11-20 DIAGNOSIS — R3981 Functional urinary incontinence: Secondary | ICD-10-CM

## 2021-11-20 HISTORY — DX: Hypotension, unspecified: I95.9

## 2021-11-20 NOTE — Assessment & Plan Note (Signed)
Patient is hypotensive. I am concerned about dehydration vs infection vs gi bleed.  ?He will need to hold his amlodipine and valsartan, but unfortunately he has already taken them.  ?

## 2021-11-20 NOTE — Patient Instructions (Signed)
Discontinue amlodipine.  ?Check bp daily.  ?Follow up 2-3 weeks for visit.  ?

## 2021-11-20 NOTE — Assessment & Plan Note (Signed)
No changes

## 2021-11-20 NOTE — Assessment & Plan Note (Signed)
Non verbal

## 2021-11-20 NOTE — Progress Notes (Signed)
? ?Subjective:  ?Patient ID: [name], male    DOB: 10-26-1960  Age: 61 y.o. MRN: [id number] ? ?Chief Complaint  ?Patient presents with  ? Hypertension  ? Hyperlipidemia  ? ?HPI ?Patient is a 61 year old severely mentally disabled white male who presents for chronic follow-up for hyperlipidemia, hypertension. Patient has history of aspiration pneumonias and is on thickened liquids. He has history of bowel obstructions.Marland Kitchen ?Patient however blood pressure initially was 90/62.  Currently did not any well walk around the office and is very active when he comes in.  Usually when he is very still to sign that he does not feel well.  Recheck of his blood pressure was 86/60 . I began to let his caretaker bring him to ED by private vehicle, but he started to walk and he nearly passed out. It took three of [place] to catch him and put him a chair.   He has not expressed any complaints wants to his caretakers.  Per his caretaker he is having good bowel movements, good urination, he is eating well.  They have not noticed any significant changes in his activity or behavior prior to this appointment except he seems sleepy. Patient is yawning. Patient did get his am medicines which include valsartan and amlodipine.  ? ?Checked his sugar 116 (he is fasting.) ?EKG is NSR, QT prolonged. Artifact present. ?Put on oxygen 2 L PNC. Not hypoxic. ?  ?Current Outpatient Medications on File Prior to Visit  ?Medication Sig Dispense Refill  ? acetaminophen (TYLENOL) 500 MG tablet Take 500 mg by mouth every 6 (six) hours as needed.    ? amLODipine (NORVASC) 5 MG tablet TAKE 1 TABLET BY MOUTH ONCE DAILY 30 tablet 11  ? atorvastatin (LIPITOR) 20 MG tablet Take 1 tablet (20 mg total) by mouth daily. 90 tablet 1  ? bisacodyl (DULCOLAX) 10 MG suppository Place 10 mg rectally daily as needed for moderate constipation.    ? citalopram (CELEXA) 40 MG tablet TAKE ONE TABLET BY MOUTH EVERY MORNING 30 tablet 11  ? clonazePAM (KLONOPIN) 0.5 MG tablet Take  1 tablet every morning, 1 at noon, 1/2 at 4 pm and 1 at bedtime 105 tablet 3  ? docusate sodium (COLACE) 100 MG capsule Take 100 mg by mouth 2 (two) times daily as needed for mild constipation.    ? feeding supplement, ENSURE COMPLETE, (ENSURE COMPLETE) LIQD Take 237 mLs by mouth daily with breakfast. 7110 mL 11  ? lactulose (CHRONULAC) 10 GM/15ML solution GIVE 45 ML (30 GM) BY MOUTH 3 TIMES A DAY 1892 mL 10  ? loratadine (CLARITIN) 10 MG tablet Take 10 mg by mouth daily.    ? montelukast (SINGULAIR) 10 MG tablet Take 1 tablet (10 mg total) by mouth daily. 30 tablet 11  ? Multiple Vitamin (MULTIVITAMIN) tablet Take 1 tablet by mouth daily. 90 tablet 0  ? nystatin cream (MYCOSTATIN) Apply 1 application topically 2 (two) times daily. X  2 30 g 0  ? pantoprazole (PROTONIX) 40 MG tablet TAKE 1 TABLET BY MOUTH TWICE A DAY 60 tablet 11  ? polyethylene glycol (MIRALAX / GLYCOLAX) 17 g packet Take 17 g by mouth daily.    ? polyethylene glycol powder (GLYCOLAX/MIRALAX) 17 GM/SCOOP powder Take 17 gm bid (noon and 6pm)    ? psyllium (METAMUCIL) 58.6 % packet Take 1 packet by mouth daily. 30 each 12  ? QUEtiapine (SEROQUEL) 200 MG tablet Take 200 mg by mouth. Take 1 1/2 every morning, 1 1/2 at noon ,  1/2 at 4 pm and 1 at bedtime    ? sodium phosphate (FLEET) 7-19 GM/118ML ENEM Place 133 mLs (1 enema total) rectally daily as needed for severe constipation. Dose 4.5 OZ Q72 H PRN. 133 mL 6  ? Starch, Thickening, LIQD 3 tsps per 4 oz of liquid. Recommend 12 oz liquid (water,tea, or juice) three times a day. 237 mL 11  ? valsartan (DIOVAN) 40 MG tablet Take 1 tablet (40 mg total) by mouth daily. 90 tablet 1  ? Wheat Dextrin (BENEFIBER DRINK MIX) PACK Take 1 each by mouth daily. 28 each 11  ? ?No current facility-administered medications on file prior to visit.  ? ?Past Medical History:  ?Diagnosis Date  ? Aspiration pneumonia of both upper lobes due to regurgitated food ([place]) 11/01/2019  ? Autism   ? Developmental non-verbal disorder    ? Essential hypertension   ? GERD without esophagitis   ? Mixed hyperlipidemia   ? Prediabetes   ? Seizure disorder ([place])   ? Severe intellectual disabilities   ? ?Past Surgical History:  ?Procedure Laterality Date  ? LAPAROTOMY  2021  ?  ?History reviewed. No pertinent family history. ?Social History  ? ?Socioeconomic History  ? Marital status: Single  ?  Spouse name: Not on file  ? Number of children: Not on file  ? Years of education: Not on file  ? Highest education level: Not on file  ?Occupational History  ? Not on file  ?Tobacco Use  ? Smoking status: Never  ? Smokeless tobacco: Never  ?Substance and Sexual Activity  ? Alcohol use: Never  ? Drug use: Never  ? Sexual activity: Not on file  ?Other Topics Concern  ? Not on file  ?Social History Narrative  ? Not on file  ? ?Social Determinants of Health  ? ?Financial Resource Strain: Not on file  ?Food Insecurity: Not on file  ?Transportation Needs: Not on file  ?Physical Activity: Not on file  ?Stress: Not on file  ?Social Connections: Not on file  ? ? ?Review of Systems  ?Constitutional:  Positive for fatigue. Negative for appetite change and fever.  ?HENT:  Negative for congestion, ear pain, sinus pressure and sore throat.   ?Respiratory:  Negative for cough, chest tightness, shortness of breath and wheezing.   ?Cardiovascular:  Negative for chest pain and palpitations.  ?Gastrointestinal:  Negative for abdominal pain, constipation, diarrhea, nausea and vomiting.  ?Genitourinary:  Negative for dysuria and hematuria.  ?Musculoskeletal:  Negative for arthralgias, back pain, joint swelling and myalgias.  ?Skin:  Negative for rash.  ?Neurological:  Negative for dizziness, weakness and headaches.  ?Psychiatric/Behavioral:  Negative for dysphoric mood. The patient is not nervous/anxious.   ? ? ?Objective:  ?BP (!) 86/60   Pulse (!) 102   Temp (!) 96.9 ?F (36.1 ?C) (Temporal)   Ht '5\' 9"'$  (1.753 m)   Wt 188 lb (85.3 kg)   SpO2 94%   BMI 27.76 kg/m?  ? ? ?   11/20/2021  ? 10:32 AM 11/20/2021  ?  9:39 AM 10/22/2021  ? 11:01 AM  ?BP/Weight  ?Systolic BP 86 90 194  ?Diastolic BP 60 62 70  ?Wt. (Lbs)  188 197  ?BMI  27.76 kg/m2 29.09 kg/m2  ? ? ?Physical Exam ?Vitals reviewed.  ?Constitutional:   ?   Appearance: He is normal weight.  ?   Comments: Patient is very quiet. Sitting in corner of room.   ?Cardiovascular:  ?   Rate and Rhythm:  Normal rate and regular rhythm.  ?   Heart sounds: Normal heart sounds.  ?   Comments: Good capillary refill. Patient is pale.  ?Pulmonary:  ?   Effort: Pulmonary effort is normal.  ?   Breath sounds: Normal breath sounds.  ?Abdominal:  ?   General: Abdomen is flat. Bowel sounds are normal.  ?   Palpations: Abdomen is soft.  ?Skin: ?   General: Skin is warm.  ?Neurological:  ?   Mental Status: He is alert.  ?   Comments: nonverbal  ?Psychiatric:  ?   Comments: Behavior is abnormal. He is yawning and sitting still.   ? ? ?Diabetic Foot Exam - Simple   ?No data filed ?  ?  ? ?Lab Results  ?Component Value Date  ? WBC 6.0 09/03/2021  ? HGB 13.9 09/03/2021  ? HCT 43.0 09/03/2021  ? PLT 304 09/03/2021  ? GLUCOSE 97 09/03/2021  ? CHOL 177 04/11/2021  ? TRIG 127 04/11/2021  ? HDL 32 (L) 04/11/2021  ? LDLCALC 122 (H) 04/11/2021  ? ALT 14 09/03/2021  ? AST 19 09/03/2021  ? NA 142 09/03/2021  ? K 4.4 09/03/2021  ? CL 105 09/03/2021  ? CREATININE 0.73 (L) 09/03/2021  ? BUN 10 09/03/2021  ? CO2 23 09/03/2021  ? TSH 1.820 09/03/2021  ? INR 1.1 12/01/2008  ? HGBA1C 6.1 (H) 04/11/2021  ? ? ? ? ?Assessment & Plan:  ? ?Problem List Items Addressed This Visit   ? ?  ? Cardiovascular and Mediastinum  ? Essential hypertension  ?  Will need to hold medicines.  ? ?  ?  ? Hypotension - Primary  ?  Patient is hypotensive. I am concerned about dehydration vs infection vs gi bleed.  ?He will need to hold his amlodipine and valsartan, but unfortunately he has already taken them.  ? ?  ?  ?  ? Other  ? Severe intellectual disabilities  ?  Nonverbal.  ? ?  ?  ? Urinary  incontinence due to cognitive impairment  ?  No changes. ? ?  ?  ? Autism  ? Prediabetes  ?. ? ?No orders of the defined types were placed in this encounter. ? ? ?No orders of the defined types were placed in [facility]

## 2021-11-20 NOTE — Assessment & Plan Note (Signed)
Will need to hold medicines.  ?

## 2021-11-21 ENCOUNTER — Other Ambulatory Visit: Payer: Self-pay

## 2021-11-21 ENCOUNTER — Other Ambulatory Visit: Payer: Self-pay | Admitting: Family Medicine

## 2021-11-21 MED ORDER — ALBUTEROL SULFATE (2.5 MG/3ML) 0.083% IN NEBU: 2.5000 mg | INHALATION_SOLUTION | Freq: Four times a day (QID) | RESPIRATORY_TRACT | 1 refills | Status: DC | PRN

## 2021-11-27 ENCOUNTER — Other Ambulatory Visit: Payer: Self-pay

## 2021-11-27 ENCOUNTER — Telehealth: Payer: Self-pay

## 2021-11-27 ENCOUNTER — Encounter: Payer: Self-pay | Admitting: Family Medicine

## 2021-11-27 DIAGNOSIS — G40909 Epilepsy, unspecified, not intractable, without status epilepticus: Secondary | ICD-10-CM | POA: Insufficient documentation

## 2021-11-27 DIAGNOSIS — J4531 Mild persistent asthma with (acute) exacerbation: Secondary | ICD-10-CM

## 2021-11-27 NOTE — Telephone Encounter (Signed)
Left detailed message on caregiver's voicemail to give [place] a call back Per Dr. Tobie Poet need to call and check on patient to see how his breathing is and the issues with his albuterol/Ipratropium ampule's being denied per his insurance. ?

## 2021-11-27 NOTE — Telephone Encounter (Signed)
PRIOR AUTH WAS DONE THROUGH PATIENT'S INSURANCE FOR ALBUTEROL NEB AMPULES AND FOR IPRATROPIUM BROMIDE AMPULES. INSURANCE DENIED BOTH . PLEASE ADVISE. ?

## 2021-11-28 ENCOUNTER — Ambulatory Visit (INDEPENDENT_AMBULATORY_CARE_PROVIDER_SITE_OTHER): Payer: Medicare Other | Admitting: Cardiology

## 2021-11-28 ENCOUNTER — Ambulatory Visit (INDEPENDENT_AMBULATORY_CARE_PROVIDER_SITE_OTHER): Payer: Medicare Other

## 2021-11-28 ENCOUNTER — Encounter: Payer: Self-pay | Admitting: Cardiology

## 2021-11-28 VITALS — BP 98/62 | HR 118 | Ht 72.0 in | Wt 193.8 lb

## 2021-11-28 DIAGNOSIS — I10 Essential (primary) hypertension: Secondary | ICD-10-CM

## 2021-11-28 DIAGNOSIS — I951 Orthostatic hypotension: Secondary | ICD-10-CM | POA: Diagnosis not present

## 2021-11-28 DIAGNOSIS — R002 Palpitations: Secondary | ICD-10-CM | POA: Diagnosis not present

## 2021-11-28 DIAGNOSIS — E782 Mixed hyperlipidemia: Secondary | ICD-10-CM

## 2021-11-28 NOTE — Patient Instructions (Addendum)
Medication Instructions:  ?Your physician has recommended you make the following change in your medication:  ? ?Stop Valsartan ? ?*If you need a refill on your cardiac medications before your next appointment, please call your pharmacy* ? ? ?Lab Work: ?None ordered ?If you have labs (blood work) drawn today and your tests are completely normal, you will receive your results only by: ?MyChart Message (if you have MyChart) OR ?A paper copy in the mail ?If you have any lab test that is abnormal or we need to change your treatment, we will call you to review the results. ? ? ?Testing/Procedures: ? ?WHY IS MY DOCTOR PRESCRIBING ZIO? ?The Zio system is proven and trusted by physicians to detect and diagnose irregular heart rhythms -- and has been prescribed to hundreds of thousands of patients. ? ?The FDA has cleared the Zio system to monitor for many different kinds of irregular heart rhythms. In a study, physicians were able to reach a diagnosis 90% of the time with the Zio system1. ? ?You can wear the Zio monitor -- a small, discreet, comfortable patch -- during your normal day-to-day activity, including while you sleep, shower, and exercise, while it records every single heartbeat for analysis. ? ?1Barrett, P., et al. Comparison of 24 Hour Holter Monitoring Versus 14 Day Novel Adhesive Patch Electrocardiographic Monitoring. [place], 2014. ? ?ZIO VS. HOLTER MONITORING ?The Zio monitor can be comfortably worn for up to 14 days. Holter monitors can be worn for 24 to 48 hours, limiting the time to record any irregular heart rhythms you may have. Zio is able to capture data for the 51% of patients who have their first symptom-triggered arrhythmia after 48 hours.1 ? ?LIVE WITHOUT RESTRICTIONS ?The Zio ambulatory cardiac monitor is a small, unobtrusive, and water-resistant patch--you might even forget you?re wearing it. The Zio monitor records and stores every beat of your heart, whether you're sleeping,  working out, or showering. ? ?Wear the monitor for 14 days, remove 12/12/21 ? ? ?Follow-Up: ?At [facility], you and your health needs are our priority.  As part of our continuing mission to provide you with exceptional heart care, we have created designated Provider Care Teams.  These Care Teams include your primary Cardiologist (physician) and Advanced Practice Providers (APPs -  Physician Assistants and Nurse Practitioners) who all work together to provide you with the care you need, when you need it. ? ?We recommend signing up for the patient portal called "MyChart".  Sign up information is provided on this After Visit Summary.  MyChart is used to connect with patients for Virtual Visits (Telemedicine).  Patients are able to view lab/test results, encounter notes, upcoming appointments, etc.  Non-urgent messages can be sent to your provider as well.   ?To learn more about what you can do with MyChart, go to [email or web address].   ? ?Your next appointment:   ?6 month(s) ? ?The format for your next appointment:   ?In Person ? ?Provider:   ?Jyl Heinz, MD ? ? ?Other Instructions ? ?Keep a BP log for 14 days. ? ?Please keep a BP log for 2 weeks and send by MyChart or mail. ? ?Blood Pressure Record Sheet ?To take your blood pressure, you will need a blood pressure machine. You can buy a blood pressure machine (blood pressure monitor) at your clinic, drug store, or online. When choosing one, consider: ?An automatic monitor that has an arm cuff. ?A cuff that wraps snugly around your upper arm. You should be able  to fit only one finger between your arm and the cuff. ?A device that stores blood pressure reading results. ?Do not choose a monitor that measures your blood pressure from your wrist or finger. ?Follow your health care provider's instructions for how to take your blood pressure. To use this form: ?Get one reading in the morning (a.m.) 1-2 hours after you take any medicines. ?Get one reading in the  evening (p.m.) before supper. ?Write down the results in the spaces on this form. ?Repeat this once a week, or as told by your health care provider. ? ?Make a follow-up appointment with your health care provider to discuss the results. ?Blood pressure log ?Date: _______________________ ?a.m. _____________________(1st reading) HR___________ ? ?          p.m. _____________________(2nd reading) HR__________ ? ?Date: _______________________ ?a.m. _____________________(1st reading) HR___________ ? ?          p.m. _____________________(2nd reading) HR__________ ?Date: _______________________ ?a.m. _____________________(1st reading) HR___________ ? ?          p.m. _____________________(2nd reading) HR__________ ?Date: _______________________ ?a.m. _____________________(1st reading) HR___________ ? ?          p.m. _____________________(2nd reading) HR__________ ? ?Date: _______________________ ?a.m. _____________________(1st reading) HR___________ ? ?          p.m. _____________________(2nd reading) HR__________ ? ?Date: _______________________ ?a.m. _____________________(1st reading) HR___________ ? ?          p.m. _____________________(2nd reading) HR__________ ? ?Date: _______________________ ?a.m. _____________________(1st reading) HR___________ ? ?          p.m. _____________________(2nd reading) HR__________ ? ? ?This information is not intended to replace advice given to you by your health care provider. Make sure you discuss any questions you have with your health care provider. ?Document Revised: 11/02/2019 Document Reviewed: 11/02/2019 ?Elsevier Patient Education ? [place].  ? ? ?

## 2021-11-28 NOTE — Progress Notes (Signed)
?Cardiology Office Note:   ? ?Date:  11/28/2021  ? ?ID:  [name], DOB 12-11-1960, MRN [id number] ? ?PCP:  Rochel Brome, MD  ?Cardiologist:  Jenean Lindau, MD  ? ?Referring MD: Rip Harbour, NP  ? ? ?ASSESSMENT:   ? ?1. Essential hypertension   ?2. Mixed hyperlipidemia   ?3. Orthostatic hypotension   ? ?PLAN:   ? ?In order of problems listed above: ? ?Primary prevention stressed. ?Echocardiogram from [facility] was reviewed. ?Patient's blood pressure is borderline.  I suspect that his low blood pressure as a cause of the symptoms most likely so I will hold his ARB medicine at this time.  His facility will have to keep a track of his blood pressures and send it to [place] in a week he is to get follow-up blood pressures.  Also to check for any rhythm issues we will do a 2-week monitor. ?Written instructions were given to the patient's attendant.  A copy of this note from today's evaluation was given also.  If things get worse he knows to go to the emergency room. ?Patient will be seen in follow-up appointment in 6 months or earlier if the patient has any concerns ? ? ? ?Medication Adjustments/Labs and Tests Ordered: ?Current medicines are reviewed at length with the patient today.  Concerns regarding medicines are outlined above.  ?No orders of the defined types were placed in this encounter. ? ?No orders of the defined types were placed in this encounter. ? ? ? ?History of Present Illness:   ? ?[name] is a 61 y.o. male who is being seen today for the evaluation of hypertension at the request of Rip Harbour, NP.  Patient is a 61 year old gentleman.  He has severe intellectual disability and is on total care and is brought by facility.  Records indicate that he had almost passing out spell while at the doctor's office.  I reviewed those records.  Patient is noncommunicative.  Attendant tells me that this is his baseline.  At the time of my evaluation, the patient is alert awake  oriented and in no distress. ? ?Past Medical History:  ?Diagnosis Date  ? Autism   ? BMI 30.0-30.9,adult 09/26/2019  ? Chronic idiopathic constipation 06/01/2020  ? 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 Developmental non-verbal disorder   ? Essential hypertension   ? Gastroesophageal reflux disease without esophagitis 09/26/2019  ? Hypotension 11/20/2021  ? Mixed hyperlipidemia   ? 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 Pedal edema 04/10/2021  ? Prediabetes   ? Seizure disorder ([place])   ? Severe intellectual disabilities   ? Thyroid nodule 11/01/2019  ? Urinary incontinence due to cognitive impairment 04/30/2021  ? ? ?Past Surgical History:  ?Procedure Laterality Date  ? LAPAROTOMY  2021  ? ? ?Current Medications: ?Current Meds  ?Medication Sig  ? acetaminophen (TYLENOL) 500 MG tablet Take 500 mg by mouth every 6 (six) hours as needed for pain.  ? albuterol (PROVENTIL) (2.5 MG/3ML) 0.083% nebulizer solution Take 3 mLs (2.5 mg total) by nebulization every 6 (six) hours as needed for wheezing or shortness of breath.  ? amLODipine (NORVASC) 5 MG tablet TAKE 1 TABLET BY MOUTH ONCE DAILY  ? atorvastatin (LIPITOR) 20 MG tablet Take 1 tablet (20 mg total) by mouth daily.  ? azithromycin (ZITHROMAX) 500 MG tablet Take 500 mg by mouth every 3 (three) days.  ? bisacodyl (DULCOLAX) 10 MG suppository Place 10 mg rectally daily as needed for moderate constipation.  ?  citalopram (CELEXA) 40 MG tablet TAKE ONE TABLET BY MOUTH EVERY MORNING  ? clonazePAM (KLONOPIN) 0.5 MG tablet Take 1 tablet every morning, 1 at noon, 1/2 at 4 pm and 1 at bedtime  ? cyclobenzaprine (FLEXERIL) 5 MG tablet Take 5 mg by mouth 3 (three) times daily as needed for muscle spasms.  ? docusate sodium (COLACE) 100 MG capsule Take 100 mg by mouth 2 (two) times daily as needed for mild constipation.  ? feeding supplement, ENSURE COMPLETE, (ENSURE COMPLETE) LIQD Take 237 mLs by mouth daily with breakfast.  ? lactulose (CHRONULAC) 10 GM/15ML solution GIVE 45 ML (30  GM) BY MOUTH 3 TIMES A DAY  ? loratadine (CLARITIN) 10 MG tablet Take 10 mg by mouth daily.  ? montelukast (SINGULAIR) 10 MG tablet Take 1 tablet (10 mg total) by mouth daily.  ? Multiple Vitamin (MULTIVITAMIN) tablet Take 1 tablet by mouth daily.  ? nystatin cream (MYCOSTATIN) Apply 1 application topically 2 (two) times daily. X  2  ? pantoprazole (PROTONIX) 40 MG tablet TAKE 1 TABLET BY MOUTH TWICE A DAY  ? polyethylene glycol (MIRALAX / GLYCOLAX) 17 g packet Take 17 g by mouth daily.  ? polyethylene glycol powder (GLYCOLAX/MIRALAX) 17 GM/SCOOP powder Take 1 Container by mouth 2 (two) times daily.  ? predniSONE (DELTASONE) 10 MG tablet Take 30 mg by mouth daily with breakfast.  ? psyllium (METAMUCIL) 58.6 % packet Take 1 packet by mouth daily.  ? QUEtiapine (SEROQUEL) 200 MG tablet Take 200 mg by mouth. Take 1 1/2 every morning, 1 1/2 at noon , 1/2 at 4 pm and 1 at bedtime  ? sodium phosphate (FLEET) 7-19 GM/118ML ENEM Place 133 mLs (1 enema total) rectally daily as needed for severe constipation. Dose 4.5 OZ Q72 H PRN.  ? Starch, Thickening, LIQD 3 tsps per 4 oz of liquid. Recommend 12 oz liquid (water,tea, or juice) three times a day.  ? valsartan (DIOVAN) 40 MG tablet Take 1 tablet (40 mg total) by mouth daily.  ? Wheat Dextrin (BENEFIBER DRINK MIX) PACK Take 1 each by mouth daily.  ?  ? ?Allergies:   Augmentin [amoxicillin-pot clavulanate]  ? ?Social History  ? ?Socioeconomic History  ? Marital status: Single  ?  Spouse name: Not on file  ? Number of children: Not on file  ? Years of education: Not on file  ? Highest education level: Not on file  ?Occupational History  ? Not on file  ?Tobacco Use  ? Smoking status: Never  ? Smokeless tobacco: Never  ?Substance and Sexual Activity  ? Alcohol use: Never  ? Drug use: Never  ? Sexual activity: Not on file  ?Other Topics Concern  ? Not on file  ?Social History Narrative  ? Not on file  ? ?Social Determinants of Health  ? ?Financial Resource Strain: Not on file   ?Food Insecurity: Not on file  ?Transportation Needs: Not on file  ?Physical Activity: Not on file  ?Stress: Not on file  ?Social Connections: Not on file  ?  ? ?Family History: ?The patient's Family history is unknown by patient. ? ?ROS:   ?Please see the history of present illness.    ?All other systems reviewed and are negative. ? ?EKGs/Labs/Other Studies Reviewed:   ? ?The following studies were reviewed today: ?EKG reveals sinus tachycardia and nonspecific ST-T changes ? ? ?Recent Labs: ?09/03/2021: ALT 14; BUN 10; Creatinine, Ser 0.73; Hemoglobin 13.9; Platelets 304; Potassium 4.4; Sodium 142; TSH 1.820  ?Recent Lipid Panel ?   ?  Component Value Date/Time  ? CHOL 177 04/11/2021 0924  ? TRIG 127 04/11/2021 0924  ? HDL 32 (L) 04/11/2021 0924  ? CHOLHDL 5.5 (H) 04/11/2021 3664  ? [place] 122 (H) 04/11/2021 4034  ? ? ?Physical Exam:   ? ?VS:  BP 98/62   Pulse (!) 118   Ht 6' (1.829 m)   Wt 193 lb 12.8 oz (87.9 kg)   SpO2 93%   BMI 26.28 kg/m?    ? ?Wt Readings from Last 3 Encounters:  ?11/28/21 193 lb 12.8 oz (87.9 kg)  ?11/20/21 188 lb (85.3 kg)  ?10/22/21 197 lb (89.4 kg)  ?  ? ?GEN: Patient is in no acute distress ?HEENT: Normal ?NECK: No JVD; No carotid bruits ?LYMPHATICS: No lymphadenopathy ?CARDIAC: S1 S2 regular, 2/6 systolic murmur at the apex. ?RESPIRATORY:  Clear to auscultation without rales, wheezing or rhonchi  ?ABDOMEN: Soft, non-tender, non-distended ?MUSCULOSKELETAL:  No edema; No deformity  ?SKIN: Warm and dry ?NEUROLOGIC:  Alert and oriented x 3 ?PSYCHIATRIC:  Normal affect  ? ? ?Signed, ?Jenean Lindau, MD  ?11/28/2021 4:35 PM    ?[place]   ?

## 2021-11-29 ENCOUNTER — Inpatient Hospital Stay: Payer: Medicare Other | Admitting: Nurse Practitioner

## 2021-12-10 ENCOUNTER — Ambulatory Visit (INDEPENDENT_AMBULATORY_CARE_PROVIDER_SITE_OTHER): Payer: Medicare Other | Admitting: Family Medicine

## 2021-12-10 VITALS — BP 110/70 | HR 76 | Temp 97.2°F | Resp 18 | Ht 69.0 in | Wt 189.0 lb

## 2021-12-10 DIAGNOSIS — I10 Essential (primary) hypertension: Secondary | ICD-10-CM | POA: Diagnosis not present

## 2021-12-10 DIAGNOSIS — F72 Severe intellectual disabilities: Secondary | ICD-10-CM

## 2021-12-10 DIAGNOSIS — R7303 Prediabetes: Secondary | ICD-10-CM

## 2021-12-10 DIAGNOSIS — J4520 Mild intermittent asthma, uncomplicated: Secondary | ICD-10-CM

## 2021-12-10 DIAGNOSIS — J208 Acute bronchitis due to other specified organisms: Secondary | ICD-10-CM

## 2021-12-10 DIAGNOSIS — E782 Mixed hyperlipidemia: Secondary | ICD-10-CM

## 2021-12-10 NOTE — Progress Notes (Signed)
v6  Subjective:  Patient ID: [name], male    DOB: [birth year]  Age: 61 y.o. MRN: [id number]  Chief Complaint  Patient presents with   Hospitalization Follow-up    HPI Patient is a 61 year old nonverbal, mentally disabled white male who presents for emergency department follow-up.  He was sent from our office by ambulance approximately 1 week ago for hypotension and near syncope.  He was found in the emergency department to be hypoxic at 80% on room air.  In mid March he was admitted for pneumonia and CHF exacerbation.  At that time further cardiac work-up was refused by family.  Patient was placed on 5 L of oxygen per nasal cannula.  Patient was given a DuoNeb and weaned off oxygen.  Patient was given IV antibiotics of Rocephin and Zithromax with IV Solu-Medrol.  Patient stabilized was arranged to get a nebulizer machine with DuoNeb treatments and p.o. antibiotics.  Patient was diagnosed with acute bronchitis and reactive airway disease.  He was also sent home on a prednisone taper.  Recommend to make sure he drinks plenty of water 6 to 8 glasses/day and to follow-up here.  Unfortunately he never did receive his DuoNeb treatments despite an appeal with his insurance company.  He is doing much better thankfully.  He is eating better his behavior is returned to normal.  He does not seem to be short of breath.  Current Outpatient Medications on File Prior to Visit  Medication Sig Dispense Refill   acetaminophen (TYLENOL) 500 MG tablet Take 500 mg by mouth every 6 (six) hours as needed for pain.     albuterol (PROVENTIL) (2.5 MG/3ML) 0.083% nebulizer solution Take 3 mLs (2.5 mg total) by nebulization every 6 (six) hours as needed for wheezing or shortness of breath. 150 mL 1   atorvastatin (LIPITOR) 20 MG tablet Take 1 tablet (20 mg total) by mouth daily. 90 tablet 1   azithromycin (ZITHROMAX) 500 MG tablet Take 500 mg by mouth every 3 (three) days.     citalopram (CELEXA) 40 MG tablet TAKE  ONE TABLET BY MOUTH EVERY MORNING 30 tablet 11   clonazePAM (KLONOPIN) 0.5 MG tablet Take 1 tablet every morning, 1 at noon, 1/2 at 4 pm and 1 at bedtime 105 tablet 3   docusate sodium (COLACE) 100 MG capsule Take 100 mg by mouth 2 (two) times daily as needed for mild constipation.     feeding supplement, ENSURE COMPLETE, (ENSURE COMPLETE) LIQD Take 237 mLs by mouth daily with breakfast. 7110 mL 11   lactulose (CHRONULAC) 10 GM/15ML solution GIVE 45 ML (30 GM) BY MOUTH 3 TIMES A DAY 1892 mL 10   loratadine (CLARITIN) 10 MG tablet Take 10 mg by mouth daily.     montelukast (SINGULAIR) 10 MG tablet Take 1 tablet (10 mg total) by mouth daily. 30 tablet 11   Multiple Vitamin (MULTIVITAMIN) tablet Take 1 tablet by mouth daily. 90 tablet 0   nystatin cream (MYCOSTATIN) Apply 1 application topically 2 (two) times daily. X  2 30 g 0   pantoprazole (PROTONIX) 40 MG tablet TAKE 1 TABLET BY MOUTH TWICE A DAY 60 tablet 11   polyethylene glycol (MIRALAX / GLYCOLAX) 17 g packet Take 17 g by mouth daily.     predniSONE (DELTASONE) 10 MG tablet Take 30 mg by mouth daily with breakfast.     psyllium (METAMUCIL) 58.6 % packet Take 1 packet by mouth daily. 30 each 12   QUEtiapine (SEROQUEL) 200 MG tablet  Take 200 mg by mouth. Take 1 1/2 every morning, 1 1/2 at noon , 1/2 at 4 pm and 1 at bedtime     sodium phosphate (FLEET) 7-19 GM/118ML ENEM Place 133 mLs (1 enema total) rectally daily as needed for severe constipation. Dose 4.5 OZ Q72 H PRN. 133 mL 6   Starch, Thickening, LIQD 3 tsps per 4 oz of liquid. Recommend 12 oz liquid (water,tea, or juice) three times a day. 237 mL 11   valsartan (DIOVAN) 40 MG tablet Take 40 mg by mouth daily.     Wheat Dextrin (BENEFIBER DRINK MIX) PACK Take 1 each by mouth daily. 28 each 11   bisacodyl (DULCOLAX) 10 MG suppository Place 10 mg rectally daily as needed for moderate constipation.     cyclobenzaprine (FLEXERIL) 5 MG tablet Take 5 mg by mouth 3 (three) times daily as needed  for muscle spasms.     No current facility-administered medications on file prior to visit.   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   Pedal edema 04/10/2021   Prediabetes    Seizure disorder (HCC)    Severe intellectual disabilities    Thyroid nodule 11/01/2019   Urinary incontinence due to cognitive impairment 04/30/2021   Past Surgical History:  Procedure Laterality Date   LAPAROTOMY  2021    Family History  Family history unknown: Yes   Social History   Socioeconomic History   Marital status: Single    Spouse name: Not on file   Number of children: Not on file   Years of education: Not on file   Highest education level: Not on file  Occupational History   Not on file  Tobacco Use   Smoking status: Never   Smokeless tobacco: Never  Substance and Sexual Activity   Alcohol use: Never   Drug use: Never   Sexual activity: Not on file  Other Topics Concern   Not on file  Social History Narrative   Not on file   Social Determinants of Health   Financial Resource Strain: Not on file  Food Insecurity: Not on file  Transportation Needs: Not on file  Physical Activity: Not on file  Stress: Not on file  Social Connections: Not on file    Review of Systems  Constitutional:  Negative for chills and fever.  Respiratory:  Negative for cough and shortness of breath.   Cardiovascular:  Negative for chest pain and leg swelling.  Gastrointestinal:  Negative for constipation, diarrhea, nausea and vomiting.  Genitourinary:  Negative for dysuria.    Objective:  BP 110/70   Pulse 76   Temp (!) 97.2 F (36.2 C)   Resp 18   Ht '5\' 9"'$   (1.753 m)   Wt 189 lb (85.7 kg)   BMI 27.91 kg/m      12/10/2021   11:02 AM 11/28/2021    4:09 PM 11/20/2021   10:32 AM  BP/Weight  Systolic BP 509 98 86  Diastolic BP 70 62 60  Wt. (Lbs) 189 193.8   BMI 27.91 kg/m2 26.28 kg/m2     Physical Exam Vitals reviewed.  Constitutional:      Appearance: Normal appearance.  Neck:  Vascular: No carotid bruit.  Cardiovascular:     Rate and Rhythm: Normal rate and regular rhythm.     Heart sounds: Normal heart sounds.  Pulmonary:     Effort: Pulmonary effort is normal.     Breath sounds: Normal breath sounds. No wheezing, rhonchi or rales.  Neurological:     Mental Status: He is alert.  Psychiatric:        Mood and Affect: Mood normal.        Behavior: Behavior normal.    Diabetic Foot Exam - Simple   No data filed      Lab Results  Component Value Date   WBC 7.2 12/10/2021   HGB 14.8 12/10/2021   HCT 45.4 12/10/2021   PLT 271 12/10/2021   GLUCOSE 109 (H) 12/10/2021   CHOL 161 12/10/2021   TRIG 105 12/10/2021   HDL 32 (L) 12/10/2021   LDLCALC 110 (H) 12/10/2021   ALT 21 12/10/2021   AST 31 12/10/2021   NA 145 (H) 12/10/2021   K 5.0 12/10/2021   CL 106 12/10/2021   CREATININE 0.79 12/10/2021   BUN 10 12/10/2021   CO2 17 (L) 12/10/2021   TSH 1.820 09/03/2021   INR 1.1 12/01/2008   HGBA1C 6.0 (H) 12/10/2021      Assessment & Plan:   Problem List Items Addressed This Visit       Cardiovascular and Mediastinum   Primary hypertension    Well-controlled.  Continue valsartan 40 mg 1 p.o. daily       Relevant Medications   valsartan (DIOVAN) 40 MG tablet   Other Relevant Orders   CBC with Differential/Platelet (Completed)   Comprehensive metabolic panel (Completed)     Respiratory   Acute bronchitis due to other specified organisms    Completed antibiotics.  Completed prednisone       Reactive airway disease    Wheezing has resolved.         Other   Mixed hyperlipidemia    Fairly well  controlled.  No changes to medicines.  Continue to work on eating a healthy diet and exercise.  Labs drawn today.        Relevant Medications   valsartan (DIOVAN) 40 MG tablet   Other Relevant Orders   Lipid panel (Completed)   Severe intellectual disabilities - Primary    Nonverbal.  Baseline returned.       Prediabetes    Hemoglobin A1c 6.1%, 3 month avg of blood sugars, is in prediabetic range.  In order to prevent progression to diabetes, recommend low carb diet and regular exercise       Relevant Orders   Hemoglobin A1c (Completed)   Orders Placed This Encounter  Procedures   CBC with Differential/Platelet   Comprehensive metabolic panel   Hemoglobin A1c   Lipid panel    Follow-up: Return in about 3 months (around 03/12/2022) for chronic fasting.  An After Visit Summary was printed and given to the patient.  Rochel Brome, MD [facility] [phone]

## 2021-12-11 LAB — COMPREHENSIVE METABOLIC PANEL
ALT: 21 IU/L (ref 0–44)
AST: 31 IU/L (ref 0–40)
Albumin/Globulin Ratio: 1.4 (ref 1.2–2.2)
Albumin: 3.9 g/dL (ref 3.8–4.9)
Alkaline Phosphatase: 103 IU/L (ref 44–121)
BUN/Creatinine Ratio: 13 (ref 9–20)
BUN: 10 mg/dL (ref 6–24)
Bilirubin Total: 0.3 mg/dL (ref 0.0–1.2)
CO2: 17 mmol/L — ABNORMAL LOW (ref 20–29)
Calcium: 9.4 mg/dL (ref 8.7–10.2)
Chloride: 106 mmol/L (ref 96–106)
Creatinine, Ser: 0.79 mg/dL (ref 0.76–1.27)
Globulin, Total: 2.8 g/dL (ref 1.5–4.5)
Glucose: 109 mg/dL — ABNORMAL HIGH (ref 70–99)
Potassium: 5 mmol/L (ref 3.5–5.2)
Sodium: 145 mmol/L — ABNORMAL HIGH (ref 134–144)
Total Protein: 6.7 g/dL (ref 6.0–8.5)
eGFR: 102 mL/min/{1.73_m2} (ref >59)

## 2021-12-11 LAB — CBC WITH DIFFERENTIAL/PLATELET
Basophils Absolute: 0 10*3/uL (ref 0.0–0.2)
Basos: 1 %
EOS (ABSOLUTE): 0.2 10*3/uL (ref 0.0–0.4)
Eos: 3 %
Hematocrit: 45.4 % (ref 37.5–51.0)
Hemoglobin: 14.8 g/dL (ref 13.0–17.7)
Immature Grans (Abs): 0 10*3/uL (ref 0.0–0.1)
Immature Granulocytes: 0 %
Lymphocytes Absolute: 1.5 10*3/uL (ref 0.7–3.1)
Lymphs: 21 %
MCH: 28.7 pg (ref 26.6–33.0)
MCHC: 32.6 g/dL (ref 31.5–35.7)
MCV: 88 fL (ref 79–97)
Monocytes Absolute: 0.6 10*3/uL (ref 0.1–0.9)
Monocytes: 8 %
Neutrophils Absolute: 4.9 10*3/uL (ref 1.4–7.0)
Neutrophils: 67 %
Platelets: 271 10*3/uL (ref 150–450)
RBC: 5.15 x10E6/uL (ref 4.14–5.80)
RDW: 14 % (ref 11.6–15.4)
WBC: 7.2 10*3/uL (ref 3.4–10.8)

## 2021-12-11 LAB — LIPID PANEL
Chol/HDL Ratio: 5 ratio (ref 0.0–5.0)
Cholesterol, Total: 161 mg/dL (ref 100–199)
HDL: 32 mg/dL — ABNORMAL LOW (ref >39)
LDL Chol Calc (NIH): 110 mg/dL — ABNORMAL HIGH (ref 0–99)
Triglycerides: 105 mg/dL (ref 0–149)
VLDL Cholesterol Cal: 19 mg/dL (ref 5–40)

## 2021-12-11 LAB — HEMOGLOBIN A1C
Est. average glucose Bld gHb Est-mCnc: 126 mg/dL
Hgb A1c MFr Bld: 6 % — ABNORMAL HIGH (ref 4.8–5.6)

## 2021-12-14 NOTE — Assessment & Plan Note (Addendum)
Nonverbal.  Baseline returned.

## 2021-12-14 NOTE — Assessment & Plan Note (Signed)
Hemoglobin A1c 6.1%, 3 month avg of blood sugars, is in prediabetic range.  In order to prevent progression to diabetes, recommend low carb diet and regular exercise  

## 2021-12-23 ENCOUNTER — Encounter: Payer: Self-pay | Admitting: Family Medicine

## 2021-12-23 DIAGNOSIS — J208 Acute bronchitis due to other specified organisms: Secondary | ICD-10-CM | POA: Insufficient documentation

## 2021-12-23 DIAGNOSIS — J45909 Unspecified asthma, uncomplicated: Secondary | ICD-10-CM | POA: Insufficient documentation

## 2021-12-23 HISTORY — DX: Acute bronchitis due to other specified organisms: J20.8

## 2021-12-23 HISTORY — DX: Unspecified asthma, uncomplicated: J45.909

## 2021-12-23 NOTE — Assessment & Plan Note (Signed)
Wheezing has resolved.

## 2021-12-23 NOTE — Assessment & Plan Note (Signed)
Completed antibiotics.  Completed prednisone

## 2021-12-23 NOTE — Assessment & Plan Note (Signed)
Fairly well controlled.  No changes to medicines.  Continue to work on eating a healthy diet and exercise.  Labs drawn today.

## 2021-12-23 NOTE — Assessment & Plan Note (Signed)
Well-controlled.  Continue valsartan 40 mg 1 p.o. daily

## 2022-01-05 NOTE — Progress Notes (Signed)
Cancelled.  

## 2022-01-16 ENCOUNTER — Telehealth: Payer: Self-pay | Admitting: Cardiology

## 2022-01-16 NOTE — Telephone Encounter (Signed)
   [facility] group manager calling to get pt's heart monitor result

## 2022-01-17 NOTE — Telephone Encounter (Signed)
LVM for [facility]

## 2022-01-22 NOTE — Telephone Encounter (Signed)
Left vm to call back

## 2022-01-24 ENCOUNTER — Other Ambulatory Visit: Payer: Self-pay

## 2022-01-24 DIAGNOSIS — K219 Gastro-esophageal reflux disease without esophagitis: Secondary | ICD-10-CM

## 2022-01-24 MED ORDER — PANTOPRAZOLE SODIUM 40 MG PO TBEC: 40.0000 mg | DELAYED_RELEASE_TABLET | Freq: Two times a day (BID) | ORAL | 1 refills | Status: DC

## 2022-03-24 ENCOUNTER — Telehealth: Payer: Self-pay

## 2022-03-24 NOTE — Telephone Encounter (Signed)
Patient was seen in [facility] ED on 03/22/22 after an unwitnessed fall in the bathroom causing a laceration to the left periocular area.  Wound length 4 cm closed with 5 sutures  Head CT negative for acute findings  No new medications/medication changes  Tdap given at hospital, updated immunizations in his chart.  7 day follow-up made with Dr Tobie Poet for suture removal.

## 2022-03-28 ENCOUNTER — Encounter: Payer: Self-pay | Admitting: Family Medicine

## 2022-03-28 ENCOUNTER — Ambulatory Visit (INDEPENDENT_AMBULATORY_CARE_PROVIDER_SITE_OTHER): Payer: Medicare Other | Admitting: Family Medicine

## 2022-03-28 VITALS — BP 130/68 | HR 60 | Temp 97.2°F | Resp 16 | Ht 69.0 in | Wt 185.0 lb

## 2022-03-28 DIAGNOSIS — Z4802 Encounter for removal of sutures: Secondary | ICD-10-CM | POA: Insufficient documentation

## 2022-03-28 DIAGNOSIS — W1830XA Fall on same level, unspecified, initial encounter: Secondary | ICD-10-CM | POA: Insufficient documentation

## 2022-03-28 DIAGNOSIS — R7303 Prediabetes: Secondary | ICD-10-CM

## 2022-03-28 DIAGNOSIS — S0101XA Laceration without foreign body of scalp, initial encounter: Secondary | ICD-10-CM

## 2022-03-28 DIAGNOSIS — E782 Mixed hyperlipidemia: Secondary | ICD-10-CM | POA: Diagnosis not present

## 2022-03-28 HISTORY — DX: Encounter for removal of sutures: Z48.02

## 2022-03-28 HISTORY — DX: Laceration without foreign body of scalp, initial encounter: S01.01XA

## 2022-03-28 HISTORY — DX: Fall on same level, unspecified, initial encounter: W18.30XA

## 2022-03-28 NOTE — Progress Notes (Addendum)
Subjective:  Patient ID: [name], male    DOB: 1960/11/15  Age: 61 y.o. MRN: [id number]  Chief Complaint  Patient presents with   Allergic Rhinitis    Follow-up    [name] comes in for a emergency department follow-up.  He had a fall on 03/22/2022 causing a laceration to the left periocular area.  He was seen and evaluated at [facility] and five sutures were placed. He was given a Tdap booster.  CT images were clear.  Patient has prediabetes and hyperlipidemia> Sees [facility]. Due for labs today. ON atorvastatin.    Current Outpatient Medications on File Prior to Visit  Medication Sig Dispense Refill   acetaminophen (TYLENOL) 500 MG tablet Take 500 mg by mouth every 6 (six) hours as needed for pain.     albuterol (PROVENTIL) (2.5 MG/3ML) 0.083% nebulizer solution Take 3 mLs (2.5 mg total) by nebulization every 6 (six) hours as needed for wheezing or shortness of breath. 150 mL 1   atorvastatin (LIPITOR) 20 MG tablet Take 1 tablet (20 mg total) by mouth daily. 90 tablet 1   bisacodyl (DULCOLAX) 10 MG suppository Place 10 mg rectally daily as needed for moderate constipation.     citalopram (CELEXA) 40 MG tablet TAKE ONE TABLET BY MOUTH EVERY MORNING 30 tablet 11   clonazePAM (KLONOPIN) 0.5 MG tablet Take 1 tablet every morning, 1 at noon, 1/2 at 4 pm and 1 at bedtime 105 tablet 3   cyclobenzaprine (FLEXERIL) 5 MG tablet Take 5 mg by mouth 3 (three) times daily as needed for muscle spasms.     docusate sodium (COLACE) 100 MG capsule Take 100 mg by mouth 2 (two) times daily as needed for mild constipation.     feeding supplement, ENSURE COMPLETE, (ENSURE COMPLETE) LIQD Take 237 mLs by mouth daily with breakfast. 7110 mL 11   lactulose (CHRONULAC) 10 GM/15ML solution GIVE 45 ML (30 GM) BY MOUTH 3 TIMES A DAY 1892 mL 10   loratadine (CLARITIN) 10 MG tablet Take 10 mg by mouth daily.     montelukast (SINGULAIR) 10 MG tablet Take 1 tablet (10 mg total) by mouth daily. 30 tablet 11    Multiple Vitamin (MULTIVITAMIN) tablet Take 1 tablet by mouth daily. 90 tablet 0   nystatin cream (MYCOSTATIN) Apply 1 application topically 2 (two) times daily. X  2 30 g 0   pantoprazole (PROTONIX) 40 MG tablet Take 1 tablet (40 mg total) by mouth 2 (two) times daily. 180 tablet 1   polyethylene glycol (MIRALAX / GLYCOLAX) 17 g packet Take 17 g by mouth daily.     predniSONE (DELTASONE) 10 MG tablet Take 30 mg by mouth daily with breakfast.     psyllium (METAMUCIL) 58.6 % packet Take 1 packet by mouth daily. 30 each 12   QUEtiapine (SEROQUEL) 200 MG tablet Take 200 mg by mouth. Take 1 1/2 every morning, 1 1/2 at noon , 1/2 at 4 pm and 1 at bedtime     sodium phosphate (FLEET) 7-19 GM/118ML ENEM Place 133 mLs (1 enema total) rectally daily as needed for severe constipation. Dose 4.5 OZ Q72 H PRN. 133 mL 6   Starch, Thickening, LIQD 3 tsps per 4 oz of liquid. Recommend 12 oz liquid (water,tea, or juice) three times a day. 237 mL 11   valsartan (DIOVAN) 40 MG tablet Take 40 mg by mouth daily.     Wheat Dextrin (BENEFIBER DRINK MIX) PACK Take 1 each by mouth daily. 28 each  11   No current facility-administered medications on file prior to visit.   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   Pedal edema 04/10/2021   Prediabetes    Primary hypertension    Reactive airway disease 12/23/2021   Seizure disorder ([place])    Severe intellectual disabilities    Thyroid nodule  11/01/2019   Urinary incontinence due to cognitive impairment 04/30/2021   Past Surgical History:  Procedure Laterality Date   LAPAROTOMY  2021    Family History  Family history unknown: Yes   Social History   Socioeconomic History   Marital status: Single    Spouse name: Not on file   Number of children: Not on file   Years of education: Not on file   Highest education level: Not on file  Occupational History   Not on file  Tobacco Use   Smoking status: Never   Smokeless tobacco: Never  Substance and Sexual Activity   Alcohol use: Never   Drug use: Never   Sexual activity: Not on file  Other Topics Concern   Not on file  Social History Narrative   Not on file   Social Determinants of Health   Financial Resource Strain: Not on file  Food Insecurity: Not on file  Transportation Needs: Not on file  Physical Activity: Not on file  Stress: Not on file  Social Connections: Not on file    Review of Systems  Constitutional:  Negative for chills and fever.  HENT:  Negative for congestion, rhinorrhea and sore throat.   Respiratory:  Negative for cough and shortness of breath.   Cardiovascular:  Negative for chest pain and palpitations.  Gastrointestinal:  Negative for abdominal pain, constipation, diarrhea, nausea and vomiting.  Genitourinary:  Negative for dysuria and urgency.  Musculoskeletal:  Negative for arthralgias, back pain and myalgias.  Skin:        Healing laceration left periocular area   Neurological:  Negative for dizziness and headaches.  Psychiatric/Behavioral:  Negative for dysphoric mood. The patient is not nervous/anxious.      Objective:  BP 130/68   Pulse 60   Temp (!) 97.2 F (36.2 C)   Resp 16   Ht '5\' 9"'$  (1.753 m)   Wt 185 lb (83.9 kg)   BMI 27.32 kg/m      03/28/2022    9:35 AM 12/10/2021   11:02 AM 11/28/2021    4:09 PM  BP/Weight  Systolic BP 500 938 98  Diastolic BP 68 70 62  Wt. (Lbs) 185 189 193.8  BMI 27.32 kg/m2 27.91 kg/m2  26.28 kg/m2    Physical Exam Vitals reviewed.  Constitutional:      Appearance: Normal appearance.  Skin:    Comments: LACERATIONl C/D/I. 5 SUTURES REMOVED. Marland Kitchen  Neurological:     Mental Status: He is alert.     Diabetic Foot Exam - Simple   No data filed      Lab Results  Component Value Date  WBC 5.8 03/28/2022   HGB 14.7 03/28/2022   HCT 44.0 03/28/2022   PLT 245 03/28/2022   GLUCOSE 94 03/28/2022   CHOL 149 03/28/2022   TRIG 84 03/28/2022   HDL 29 (L) 03/28/2022   LDLCALC 104 (H) 03/28/2022   ALT 16 03/28/2022   AST 20 03/28/2022   NA 143 03/28/2022   K 4.2 03/28/2022   CL 106 03/28/2022   CREATININE 0.78 03/28/2022   BUN 9 03/28/2022   CO2 22 03/28/2022   TSH 1.510 03/28/2022   INR 1.1 12/01/2008   HGBA1C 5.9 (H) 03/28/2022      Assessment & Plan:   Problem List Items Addressed This Visit       Other   Mixed hyperlipidemia - Primary    Check labs.       Relevant Orders   CBC with Differential/Platelet (Completed)   Comprehensive metabolic panel (Completed)   Hemoglobin A1c (Completed)   Lipid panel (Completed)   TSH (Completed)   Prediabetes    Check labs.       Relevant Orders   CBC with Differential/Platelet (Completed)   Comprehensive metabolic panel (Completed)   Hemoglobin A1c (Completed)   Lipid panel (Completed)   TSH (Completed)   Fall on same level    Not at high risk for falls. Slipped on some water.       Laceration of occipital region of scalp    Healing well.      Encounter for removal of sutures    5 sutures remoed.      .  No orders of the defined types were placed in this encounter.   Orders Placed This Encounter  Procedures   CBC with Differential/Platelet   Comprehensive metabolic panel   Hemoglobin A1c   Lipid panel   TSH   Cardiovascular Risk Assessment     Follow-up: Return in about 3 months (around 06/27/2022) for chronic fasting.  An After Visit Summary was printed and given to the  patient.  Rochel Brome, MD [facility] [phone]

## 2022-03-28 NOTE — Assessment & Plan Note (Signed)
5 sutures remoed.

## 2022-03-28 NOTE — Assessment & Plan Note (Signed)
Not at high risk for falls. Slipped on some water.

## 2022-03-28 NOTE — Assessment & Plan Note (Signed)
Healing well.

## 2022-03-28 NOTE — Assessment & Plan Note (Signed)
Check labs 

## 2022-03-29 LAB — LIPID PANEL
Chol/HDL Ratio: 5.1 ratio — ABNORMAL HIGH (ref 0.0–5.0)
Cholesterol, Total: 149 mg/dL (ref 100–199)
HDL: 29 mg/dL — ABNORMAL LOW (ref >39)
LDL Chol Calc (NIH): 104 mg/dL — ABNORMAL HIGH (ref 0–99)
Triglycerides: 84 mg/dL (ref 0–149)
VLDL Cholesterol Cal: 16 mg/dL (ref 5–40)

## 2022-03-29 LAB — COMPREHENSIVE METABOLIC PANEL
ALT: 16 IU/L (ref 0–44)
AST: 20 IU/L (ref 0–40)
Albumin/Globulin Ratio: 1.5 (ref 1.2–2.2)
Albumin: 4 g/dL (ref 3.8–4.9)
Alkaline Phosphatase: 115 IU/L (ref 44–121)
BUN/Creatinine Ratio: 12 (ref 9–20)
BUN: 9 mg/dL (ref 6–24)
Bilirubin Total: 0.3 mg/dL (ref 0.0–1.2)
CO2: 22 mmol/L (ref 20–29)
Calcium: 8.9 mg/dL (ref 8.7–10.2)
Chloride: 106 mmol/L (ref 96–106)
Creatinine, Ser: 0.78 mg/dL (ref 0.76–1.27)
Globulin, Total: 2.7 g/dL (ref 1.5–4.5)
Glucose: 94 mg/dL (ref 70–99)
Potassium: 4.2 mmol/L (ref 3.5–5.2)
Sodium: 143 mmol/L (ref 134–144)
Total Protein: 6.7 g/dL (ref 6.0–8.5)
eGFR: 103 mL/min/{1.73_m2} (ref >59)

## 2022-03-29 LAB — CBC WITH DIFFERENTIAL/PLATELET
Basophils Absolute: 0.1 10*3/uL (ref 0.0–0.2)
Basos: 1 %
EOS (ABSOLUTE): 0.2 10*3/uL (ref 0.0–0.4)
Eos: 3 %
Hematocrit: 44 % (ref 37.5–51.0)
Hemoglobin: 14.7 g/dL (ref 13.0–17.7)
Immature Grans (Abs): 0 10*3/uL (ref 0.0–0.1)
Immature Granulocytes: 0 %
Lymphocytes Absolute: 1.4 10*3/uL (ref 0.7–3.1)
Lymphs: 23 %
MCH: 29.5 pg (ref 26.6–33.0)
MCHC: 33.4 g/dL (ref 31.5–35.7)
MCV: 88 fL (ref 79–97)
Monocytes Absolute: 0.6 10*3/uL (ref 0.1–0.9)
Monocytes: 9 %
Neutrophils Absolute: 3.7 10*3/uL (ref 1.4–7.0)
Neutrophils: 64 %
Platelets: 245 10*3/uL (ref 150–450)
RBC: 4.98 x10E6/uL (ref 4.14–5.80)
RDW: 13.2 % (ref 11.6–15.4)
WBC: 5.8 10*3/uL (ref 3.4–10.8)

## 2022-03-29 LAB — HEMOGLOBIN A1C
Est. average glucose Bld gHb Est-mCnc: 123 mg/dL
Hgb A1c MFr Bld: 5.9 % — ABNORMAL HIGH (ref 4.8–5.6)

## 2022-03-29 LAB — TSH: TSH: 1.51 u[IU]/mL (ref 0.450–4.500)

## 2022-03-29 LAB — CARDIOVASCULAR RISK ASSESSMENT

## 2022-04-23 DIAGNOSIS — I10 Essential (primary) hypertension: Secondary | ICD-10-CM | POA: Insufficient documentation

## 2022-04-30 ENCOUNTER — Encounter: Payer: Self-pay | Admitting: Cardiology

## 2022-04-30 ENCOUNTER — Ambulatory Visit: Payer: Medicare Other | Attending: Cardiology | Admitting: Cardiology

## 2022-04-30 VITALS — BP 128/80 | HR 90 | Ht 69.0 in | Wt 184.8 lb

## 2022-04-30 DIAGNOSIS — E782 Mixed hyperlipidemia: Secondary | ICD-10-CM | POA: Insufficient documentation

## 2022-04-30 DIAGNOSIS — I10 Essential (primary) hypertension: Secondary | ICD-10-CM | POA: Diagnosis present

## 2022-04-30 DIAGNOSIS — F72 Severe intellectual disabilities: Secondary | ICD-10-CM | POA: Insufficient documentation

## 2022-04-30 NOTE — Patient Instructions (Signed)

## 2022-04-30 NOTE — Progress Notes (Signed)
Cardiology Office Note:    Date:  04/30/2022   ID:  [name], DOB [birth year], MRN [id number]  PCP:  Rochel Brome, MD  Cardiologist:  Jenean Lindau, MD   Referring MD: Rochel Brome, MD    ASSESSMENT:    1. Mixed hyperlipidemia   2. Essential hypertension   3. Severe intellectual disabilities    PLAN:    In order of problems listed above:  Primary prevention stressed with the patient.  Importance of compliance with diet medication stressed any vocalized understanding.  I discussed this with their attendant also. Essential hypertension: Blood pressure stable.  Diet was emphasized.  I told his attendant to have him active and ambulatory to the best of their ability. Lipidemia: On lipid-lowering medications followed by primary care.  Lipids are stable I reviewed recent lab work by primary care. Patient will be seen in follow-up appointment in 6 months or earlier if the patient has any concerns    Medication Adjustments/Labs and Tests Ordered: Current medicines are reviewed at length with the patient today.  Concerns regarding medicines are outlined above.  No orders of the defined types were placed in this encounter.  No orders of the defined types were placed in this encounter.    No chief complaint on file.    History of Present Illness:    [name] is a 61 y.o. male.  Patient has past medical history of essential hypertension and dyslipidemia.  He has significant intellectual disabilities.  He denies any problems at this time he is comfortable.  He is not in any distress.  He is accompanied by his attendant.  She tells me that he is in baseline state of health.  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   citalopram (CELEXA) 40 MG tablet TAKE ONE TABLET BY MOUTH EVERY MORNING   clonazePAM (KLONOPIN) 0.5 MG tablet Take 1 tablet every morning, 1 at noon, 1/2 at 4 pm and 1 at bedtime   docusate sodium (COLACE) 100 MG capsule Take 100 mg by mouth 2 (two) times daily as needed for mild constipation.   loratadine (CLARITIN) 10 MG tablet Take 10 mg by mouth daily.   montelukast (SINGULAIR) 10 MG tablet Take 1 tablet (10 mg total) by mouth daily.   Multiple Vitamin (MULTIVITAMIN) tablet Take 1 tablet by mouth daily.   pantoprazole (PROTONIX) 40 MG tablet Take 1 tablet (40 mg total) by mouth 2 (two) times daily.   QUEtiapine (SEROQUEL)  200 MG tablet Take 200 mg by mouth at bedtime.   valsartan (DIOVAN) 40 MG tablet Take 40 mg by mouth daily.     Allergies:   Augmentin [amoxicillin-pot clavulanate]   Social History   Socioeconomic History   Marital status: Single    Spouse name: Not on file   Number of children: Not on file   Years of education: Not on file   Highest education level: Not on file  Occupational History   Not on file  Tobacco Use   Smoking status: Never   Smokeless tobacco: Never  Substance and Sexual Activity   Alcohol use: Never   Drug  use: Never   Sexual activity: Not on file  Other Topics Concern   Not on file  Social History Narrative   Not on file   Social Determinants of Health   Financial Resource Strain: Not on file  Food Insecurity: Not on file  Transportation Needs: Not on file  Physical Activity: Not on file  Stress: Not on file  Social Connections: Not on file     Family History: The patient's Family history is unknown by patient.  ROS:   Please see the history of present illness.    All other systems reviewed and are negative.  EKGs/Labs/Other Studies Reviewed:    The following studies were reviewed today: I discussed my findings with the patient at length.   Recent Labs: 03/28/2022: ALT 16; BUN 9; Creatinine, Ser 0.78; Hemoglobin 14.7; Platelets 245; Potassium 4.2; Sodium 143; TSH 1.510  Recent Lipid Panel    Component Value Date/Time   CHOL 149 03/28/2022 1002   TRIG 84 03/28/2022 1002   HDL 29 (L) 03/28/2022 1002   CHOLHDL 5.1 (H) 03/28/2022 1002   LDLCALC 104 (H) 03/28/2022 1002    Physical Exam:    VS:  BP 128/80   Pulse 90   Ht '5\' 9"'$  (1.753 m)   Wt 184 lb 12.8 oz (83.8 kg)   SpO2 98%   BMI 27.29 kg/m     Wt Readings from Last 3 Encounters:  04/30/22 184 lb 12.8 oz (83.8 kg)  03/28/22 185 lb (83.9 kg)  12/10/21 189 lb (85.7 kg)     GEN: Patient is in no acute distress HEENT: Normal NECK: No JVD; No carotid bruits LYMPHATICS: No lymphadenopathy CARDIAC: Hear sounds regular, 2/6 systolic murmur at the apex. RESPIRATORY:  Clear to auscultation without rales, wheezing or rhonchi  ABDOMEN: Soft, non-tender, non-distended MUSCULOSKELETAL:  No edema; No deformity  SKIN: Warm and dry NEUROLOGIC:  Alert and oriented x 3 PSYCHIATRIC:  Normal affect   Signed, Jenean Lindau, MD  04/30/2022 10:02 AM    Gas

## 2022-05-21 ENCOUNTER — Telehealth: Payer: Self-pay

## 2022-05-21 ENCOUNTER — Other Ambulatory Visit: Payer: Self-pay

## 2022-05-21 MED ORDER — ATORVASTATIN CALCIUM 20 MG PO TABS: 20.0000 mg | ORAL_TABLET | Freq: Every day | ORAL | 1 refills | Status: DC

## 2022-05-21 MED ORDER — VALSARTAN 40 MG PO TABS
40.0000 mg | ORAL_TABLET | Freq: Every day | ORAL | 1 refills | Status: DC
Start: 2022-05-21 — End: 2022-07-02

## 2022-05-21 NOTE — Telephone Encounter (Signed)
I left a message on the number(s) listed in the patients chart requesting the patient to call back regarding the [place] appointment for 07/02/2022. The provider is out of the office that day. The appointment has been canceled. Waiting for the patient to return the call.

## 2022-05-27 ENCOUNTER — Other Ambulatory Visit: Payer: Self-pay

## 2022-05-27 DIAGNOSIS — F79 Unspecified intellectual disabilities: Secondary | ICD-10-CM

## 2022-05-27 MED ORDER — MONTELUKAST SODIUM 10 MG PO TABS: 10.0000 mg | ORAL_TABLET | Freq: Every day | ORAL | 11 refills | Status: DC

## 2022-05-28 ENCOUNTER — Ambulatory Visit (INDEPENDENT_AMBULATORY_CARE_PROVIDER_SITE_OTHER): Payer: Medicare Other | Admitting: Legal Medicine

## 2022-05-28 VITALS — BP 100/70 | HR 85 | Temp 97.5°F | Wt 194.0 lb

## 2022-05-28 DIAGNOSIS — N3000 Acute cystitis without hematuria: Secondary | ICD-10-CM | POA: Diagnosis not present

## 2022-05-28 DIAGNOSIS — R350 Frequency of micturition: Secondary | ICD-10-CM

## 2022-05-28 LAB — POCT URINALYSIS DIP (CLINITEK)
Bilirubin, UA: NEGATIVE
Blood, UA: NEGATIVE
Glucose, UA: NEGATIVE mg/dL
Leukocytes, UA: NEGATIVE
Nitrite, UA: NEGATIVE
POC PROTEIN,UA: NEGATIVE
Spec Grav, UA: >=1.03 — AB (ref 1.010–1.025)
Urobilinogen, UA: 0.2 E.U./dL
pH, UA: 5.5 (ref 5.0–8.0)

## 2022-05-28 MED ORDER — DOXYCYCLINE HYCLATE 100 MG PO TABS: 100.0000 mg | ORAL_TABLET | Freq: Two times a day (BID) | ORAL | 0 refills | Status: DC

## 2022-05-28 NOTE — Progress Notes (Unsigned)
Acute Office Visit  Subjective:    Patient ID: [name], male    DOB: 07-07-1961, 61 y.o.   MRN: [id number]  Chief Complaint  Patient presents with   Urinary Tract Infection    HPI: Patient is in today for UTI. Caregiver states that patient has a strong odor to urine and has been frequently urinating.  Also patient wakes up with a wet bed. This has started last week.  Past Medical History:  Diagnosis Date   Acute bronchitis due to other specified organisms 12/23/2021   Autism    BMI 30.0-30.9,adult 09/26/2019   Chronic idiopathic constipation 06/01/2020   Depression, major, recurrent, mild ([place]) 09/26/2019   Developmental non-verbal disorder    Encounter for removal of sutures 03/28/2022   Essential hypertension    Fall on same level 03/28/2022   Gastroesophageal reflux disease without esophagitis 09/26/2019   Hypotension 11/20/2021   Laceration of occipital region of scalp 03/28/2022   Mixed hyperlipidemia    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   Pedal edema 04/10/2021   Prediabetes    Primary hypertension    Reactive airway disease 12/23/2021   Seizure disorder ([place])    Severe intellectual disabilities    Thyroid nodule 11/01/2019   Urinary incontinence due to cognitive impairment 04/30/2021    Past Surgical History:  Procedure Laterality Date   LAPAROTOMY  2021    Family History  Family history unknown: Yes    Social History   Socioeconomic History   Marital status: Single    Spouse name: Not on file   Number of children: Not on file   Years of education: Not on file   Highest education level: Not on file  Occupational History   Not on file  Tobacco Use   Smoking status: Never   Smokeless tobacco: Never  Substance and Sexual Activity   Alcohol use: Never   Drug use: Never   Sexual activity: Not on file  Other Topics Concern   Not on file  Social History Narrative   Not on file   Social Determinants of Health   Financial Resource Strain: Not on file  Food  Insecurity: Not on file  Transportation Needs: Not on file  Physical Activity: Not on file  Stress: Not on file  Social Connections: Not on file  Intimate Partner Violence: Not on file    Outpatient Medications Prior to Visit  Medication Sig Dispense Refill   albuterol (PROVENTIL) (2.5 MG/3ML) 0.083% nebulizer solution Take 3 mLs (2.5 mg total) by nebulization every 6 (six) hours as needed for wheezing or shortness of breath. 150 mL 1   atorvastatin (LIPITOR) 20 MG tablet Take 1 tablet (20 mg total) by mouth daily. 90 tablet 1   citalopram (CELEXA) 40 MG tablet TAKE ONE TABLET BY MOUTH EVERY MORNING 30 tablet 11   clonazePAM (KLONOPIN) 0.5 MG tablet Take 1 tablet every morning, 1 at noon, 1/2 at 4 pm and 1 at bedtime 105 tablet 3   docusate sodium (COLACE) 100 MG capsule Take 100 mg by mouth 2 (two) times daily as needed for mild constipation.     feeding supplement, ENSURE COMPLETE, (ENSURE COMPLETE) LIQD Take 237 mLs by mouth daily with breakfast. 7110 mL 11   lactulose (CHRONULAC) 10 GM/15ML solution GIVE 45 ML (30 GM) BY MOUTH 3 TIMES A DAY 1892 mL 10   loratadine (CLARITIN) 10 MG tablet Take 10 mg by mouth daily.     montelukast (SINGULAIR) 10 MG tablet Take 1 tablet (10  mg total) by mouth daily. 30 tablet 11   Multiple Vitamin (MULTIVITAMIN) tablet Take 1 tablet by mouth daily. 90 tablet 0   pantoprazole (PROTONIX) 40 MG tablet Take 1 tablet (40 mg total) by mouth 2 (two) times daily. 180 tablet 1   psyllium (METAMUCIL) 58.6 % packet Take 1 packet by mouth daily. 30 each 12   QUEtiapine (SEROQUEL) 200 MG tablet Take 200 mg by mouth at bedtime.     sodium phosphate (FLEET) 7-19 GM/118ML ENEM Place 133 mLs (1 enema total) rectally daily as needed for severe constipation. Dose 4.5 OZ Q72 H PRN. 133 mL 6   Starch, Thickening, LIQD 3 tsps per 4 oz of liquid. Recommend 12 oz liquid (water,tea, or juice) three times a day. 237 mL 11   valsartan (DIOVAN) 40 MG tablet Take 1 tablet (40 mg  total) by mouth daily. 90 tablet 1   Wheat Dextrin (BENEFIBER DRINK MIX) PACK Take 1 each by mouth daily. 28 each 11   nystatin cream (MYCOSTATIN) Apply 1 application topically 2 (two) times daily. X  2 (Patient not taking: Reported on 05/28/2022) 30 g 0   No facility-administered medications prior to visit.    Allergies  Allergen Reactions   Augmentin [Amoxicillin-Pot Clavulanate]     Review of Systems  Constitutional:  Negative for chills, fatigue and fever.  HENT:  Negative for congestion.   Respiratory:  Negative for cough and shortness of breath.   Genitourinary:  Positive for frequency and urgency.  Neurological:  Negative for dizziness and headaches.       Objective:    Physical Exam Vitals reviewed.  Constitutional:      Appearance: Normal appearance.  HENT:     Head: Normocephalic and atraumatic.     Right Ear: Tympanic membrane normal.     Left Ear: Tympanic membrane normal.     Nose: Nose normal.     Mouth/Throat:     Mouth: Mucous membranes are moist.     Pharynx: Oropharynx is clear.  Eyes:     Extraocular Movements: Extraocular movements intact.     Conjunctiva/sclera: Conjunctivae normal.     Pupils: Pupils are equal, round, and reactive to light.  Cardiovascular:     Rate and Rhythm: Normal rate and regular rhythm.     Pulses: Normal pulses.     Heart sounds: No murmur heard.    Gallop present.  Pulmonary:     Effort: Pulmonary effort is normal. No respiratory distress.     Breath sounds: Normal breath sounds. No wheezing.  Abdominal:     General: Abdomen is flat. Bowel sounds are normal. There is no distension.     Palpations: Abdomen is soft. There is no mass.     Tenderness: There is no abdominal tenderness. There is no right CVA tenderness or left CVA tenderness.  Musculoskeletal:        General: Normal range of motion.     Cervical back: Normal range of motion.  Skin:    General: Skin is warm.     Capillary Refill: Capillary refill takes  less than 2 seconds.  Neurological:     General: No focal deficit present.     Mental Status: He is alert. Mental status is at baseline.     BP 100/70   Pulse 85   Temp (!) 97.5 F (36.4 C)   Wt 194 lb (88 kg)   SpO2 98%   BMI 28.65 kg/m  Wt Readings from Last 3 Encounters:  05/28/22 194 lb (88 kg)  04/30/22 184 lb 12.8 oz (83.8 kg)  03/28/22 185 lb (83.9 kg)    Health Maintenance Due  Topic Date Due   Hepatitis C Screening  Never done   Zoster Vaccines- Shingrix (1 of 2) Never done   COVID-19 Vaccine (2 - Moderna risk series) 09/15/2019   INFLUENZA VACCINE  02/25/2022    There are no preventive care reminders to display for this patient.   Lab Results  Component Value Date   TSH 1.510 03/28/2022   Lab Results  Component Value Date   WBC 5.8 03/28/2022   HGB 14.7 03/28/2022   HCT 44.0 03/28/2022   MCV 88 03/28/2022   PLT 245 03/28/2022   Lab Results  Component Value Date   NA 143 03/28/2022   K 4.2 03/28/2022   CO2 22 03/28/2022   GLUCOSE 94 03/28/2022   BUN 9 03/28/2022   CREATININE 0.78 03/28/2022   BILITOT 0.3 03/28/2022   ALKPHOS 115 03/28/2022   AST 20 03/28/2022   ALT 16 03/28/2022   PROT 6.7 03/28/2022   ALBUMIN 4.0 03/28/2022   CALCIUM 8.9 03/28/2022   EGFR 103 03/28/2022   Lab Results  Component Value Date   CHOL 149 03/28/2022   Lab Results  Component Value Date   HDL 29 (L) 03/28/2022   Lab Results  Component Value Date   LDLCALC 104 (H) 03/28/2022   Lab Results  Component Value Date   TRIG 84 03/28/2022   Lab Results  Component Value Date   CHOLHDL 5.1 (H) 03/28/2022   Lab Results  Component Value Date   HGBA1C 5.9 (H) 03/28/2022       Assessment & Plan:   Problem List Items Addressed This Visit   None Visit Diagnoses     Frequency of urination    -  Primary   Relevant Orders   POCT URINALYSIS DIP (CLINITEK) (Completed)   Urine Culture Patient is having increase frequency in urination    Acute cystitis  without hematuria       Relevant Medications   doxycycline (VIBRA-TABS) 100 MG tablet UA OK but sp. Gr. 1.030, do culture but treat symptoms is doxycycline until culture results.  We would like to avoid urology due to his autism.      Meds ordered this encounter  Medications   doxycycline (VIBRA-TABS) 100 MG tablet    Sig: Take 1 tablet (100 mg total) by mouth 2 (two) times daily.    Dispense:  20 tablet    Refill:  0    Orders Placed This Encounter  Procedures   Urine Culture   POCT URINALYSIS DIP (CLINITEK)     Follow-up: Return if symptoms worsen or fail to improve.  An After Visit Summary was printed and given to the patient.  Reinaldo Meeker, MD [facility] [phone]

## 2022-05-29 ENCOUNTER — Encounter: Payer: Self-pay | Admitting: Legal Medicine

## 2022-05-30 LAB — URINE CULTURE

## 2022-05-31 NOTE — Progress Notes (Signed)
<   10,000 colonies in urine culture- negative, can stop medicine lp

## 2022-06-16 ENCOUNTER — Telehealth: Payer: Self-pay

## 2022-06-16 NOTE — Telephone Encounter (Signed)
Patient care giver called and stated he was drooling and turning red in the face down his neck.  Recommend patient be seen as soon as possible. Urgent care or ER due to [place] not having any opening today.

## 2022-07-02 ENCOUNTER — Other Ambulatory Visit: Payer: Self-pay | Admitting: Family Medicine

## 2022-07-02 ENCOUNTER — Ambulatory Visit: Payer: Medicare Other | Admitting: Family Medicine

## 2022-07-08 ENCOUNTER — Other Ambulatory Visit: Payer: Self-pay

## 2022-07-08 MED ORDER — VALSARTAN 40 MG PO TABS: 40.0000 mg | ORAL_TABLET | Freq: Every day | ORAL | 5 refills | Status: DC

## 2022-07-17 ENCOUNTER — Ambulatory Visit: Payer: Medicare Other | Admitting: Family Medicine

## 2022-07-25 ENCOUNTER — Ambulatory Visit: Payer: Medicare Other | Admitting: Nurse Practitioner

## 2022-07-25 ENCOUNTER — Ambulatory Visit: Payer: Medicare Other

## 2022-07-25 DIAGNOSIS — Z23 Encounter for immunization: Secondary | ICD-10-CM

## 2022-08-01 ENCOUNTER — Other Ambulatory Visit: Payer: Self-pay | Admitting: Family Medicine

## 2022-08-01 DIAGNOSIS — K219 Gastro-esophageal reflux disease without esophagitis: Secondary | ICD-10-CM

## 2022-08-13 ENCOUNTER — Ambulatory Visit (INDEPENDENT_AMBULATORY_CARE_PROVIDER_SITE_OTHER): Payer: Medicare Other | Admitting: Nurse Practitioner

## 2022-08-13 ENCOUNTER — Encounter: Payer: Self-pay | Admitting: Nurse Practitioner

## 2022-08-13 VITALS — BP 122/80 | HR 88 | Temp 96.9°F | Resp 16 | Ht 69.0 in | Wt 175.0 lb

## 2022-08-13 DIAGNOSIS — F840 Autistic disorder: Secondary | ICD-10-CM | POA: Diagnosis not present

## 2022-08-13 DIAGNOSIS — F72 Severe intellectual disabilities: Secondary | ICD-10-CM

## 2022-08-13 DIAGNOSIS — R7303 Prediabetes: Secondary | ICD-10-CM

## 2022-08-13 DIAGNOSIS — Z8701 Personal history of pneumonia (recurrent): Secondary | ICD-10-CM

## 2022-08-13 DIAGNOSIS — I10 Essential (primary) hypertension: Secondary | ICD-10-CM

## 2022-08-13 DIAGNOSIS — E782 Mixed hyperlipidemia: Secondary | ICD-10-CM

## 2022-08-13 DIAGNOSIS — G40909 Epilepsy, unspecified, not intractable, without status epilepticus: Secondary | ICD-10-CM

## 2022-08-13 NOTE — Progress Notes (Signed)
Subjective:  Patient ID: [name], male    DOB: 05-Jul-1961  Age: 62 y.o. MRN: [id number]  CC: Prediabetes Hyperlipidemia HTN  HPI  Pt presents for follow-up of chronic medical problems of hyperlipidemia, prediabetes, and hypertension. [name] has autism with intellectual disabilities, non-verbal. He is a resident at a group home in [place]. He is accompanied by group home staff member that helps supplement health history. She tells me he is on a pureed diet due to history of aspiration pneumonia.   Lipid/Cholesterol, Follow-up  Last lipid panel Other pertinent labs  Lab Results  Component Value Date   CHOL 149 03/28/2022   HDL 29 (L) 03/28/2022   LDLCALC 104 (H) 03/28/2022   TRIG 84 03/28/2022   CHOLHDL 5.1 (H) 03/28/2022   Lab Results  Component Value Date   ALT 16 03/28/2022   AST 20 03/28/2022   PLT 245 03/28/2022   TSH 1.510 03/28/2022     Management includes Lipitor 20 mg QD He reports excellent compliance with treatment. He is not having side effects.    GERD, Follow up:  Current treatment consist of: Pantoprazole 40 mg QD He reports excellent compliance with treatment. He is not having side effects. .   Hypertension, Follow up: BP at that visit was 130/68. Management includes Valsartan 40 mg. He reports excellent compliance with treatment. He is not having side effects.  He is following a Regular diet. Pertinent labs: Lab Results  Component Value Date   CHOL 149 03/28/2022   HDL 29 (L) 03/28/2022   LDLCALC 104 (H) 03/28/2022   TRIG 84 03/28/2022   CHOLHDL 5.1 (H) 03/28/2022   Lab Results  Component Value Date   NA 143 03/28/2022   K 4.2 03/28/2022   CREATININE 0.78 03/28/2022   EGFR 103 03/28/2022   GFRNONAA 98 06/25/2020   GLUCOSE 94 03/28/2022     The 10-year ASCVD risk score (Arnett DK, et al., 2019) is: 7.4%   Prediabetes, Follow-up  Lab Results  Component Value Date   HGBA1C 5.9 (H) 03/28/2022   HGBA1C 6.0 (H) 12/10/2021    HGBA1C 6.1 (H) 04/11/2021   GLUCOSE 94 03/28/2022   GLUCOSE 109 (H) 12/10/2021   GLUCOSE 97 09/03/2021    Last seen for for this3 months ago.  Management since that visit includes diet modification. Current symptoms include none and have been stable. Prior visit with dietician: no Current diet:  pureed Current exercise: walking  Pertinent Labs:    Component Value Date/Time   CHOL 149 03/28/2022 1002   TRIG 84 03/28/2022 1002   CHOLHDL 5.1 (H) 03/28/2022 1002   CREATININE 0.78 03/28/2022 1002    Wt Readings from Last 3 Encounters:  08/13/22 175 lb (79.4 kg)  05/28/22 194 lb (88 kg)  04/30/22 184 lb 12.8 oz (83.8 kg)      Current Outpatient Medications on File Prior to Visit  Medication Sig Dispense Refill   albuterol (PROVENTIL) (2.5 MG/3ML) 0.083% nebulizer solution Take 3 mLs (2.5 mg total) by nebulization every 6 (six) hours as needed for wheezing or shortness of breath. 150 mL 1   atorvastatin (LIPITOR) 20 MG tablet Take 1 tablet (20 mg total) by mouth daily. 90 tablet 1   citalopram (CELEXA) 40 MG tablet TAKE ONE TABLET BY MOUTH EVERY MORNING 30 tablet 11   clonazePAM (KLONOPIN) 0.5 MG tablet Take 1 tablet every morning, 1 at noon, 1/2 at 4 pm and 1 at bedtime 105 tablet 3   docusate sodium (COLACE) 100  MG capsule Take 100 mg by mouth 2 (two) times daily as needed for mild constipation.     doxycycline (VIBRA-TABS) 100 MG tablet Take 1 tablet (100 mg total) by mouth 2 (two) times daily. 20 tablet 0   feeding supplement, ENSURE COMPLETE, (ENSURE COMPLETE) LIQD Take 237 mLs by mouth daily with breakfast. 7110 mL 11   lactulose (CHRONULAC) 10 GM/15ML solution GIVE 45 ML (30 GM) BY MOUTH 3 TIMES A DAY 1892 mL 10   loratadine (CLARITIN) 10 MG tablet Take 10 mg by mouth daily.     montelukast (SINGULAIR) 10 MG tablet Take 1 tablet (10 mg total) by mouth daily. 30 tablet 11   Multiple Vitamin (MULTIVITAMIN) tablet Take 1 tablet by mouth daily. 90 tablet 0   nystatin cream  (MYCOSTATIN) Apply 1 application topically 2 (two) times daily. X  2 (Patient not taking: Reported on 05/28/2022) 30 g 0   pantoprazole (PROTONIX) 40 MG tablet TAKE 1 TABLET BY MOUTH TWICE DAILY  * DO NOT CRUSH * 60 tablet 11   psyllium (METAMUCIL) 58.6 % packet Take 1 packet by mouth daily. 30 each 12   QUEtiapine (SEROQUEL) 200 MG tablet Take 200 mg by mouth at bedtime.     sodium phosphate (FLEET) 7-19 GM/118ML ENEM Place 133 mLs (1 enema total) rectally daily as needed for severe constipation. Dose 4.5 OZ Q72 H PRN. 133 mL 6   Starch, Thickening, LIQD 3 tsps per 4 oz of liquid. Recommend 12 oz liquid (water,tea, or juice) three times a day. 237 mL 11   valsartan (DIOVAN) 40 MG tablet Take 1 tablet (40 mg total) by mouth daily. 30 tablet 5   Wheat Dextrin (BENEFIBER DRINK MIX) PACK Take 1 each by mouth daily. 28 each 11   No current facility-administered medications on file prior to visit.   Past Medical History:  Diagnosis Date   Acute bronchitis due to other specified organisms 12/23/2021   Autism    BMI 30.0-30.9,adult 09/26/2019   Chronic idiopathic constipation 06/01/2020   Depression, major, recurrent, mild ([place]) 09/26/2019   Developmental non-verbal disorder    Encounter for removal of sutures 03/28/2022   Essential hypertension    Fall on same level 03/28/2022   Gastroesophageal reflux disease without esophagitis 09/26/2019   Hypotension 11/20/2021   Laceration of occipital region of scalp 03/28/2022   Mixed hyperlipidemia    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   Pedal edema 04/10/2021   Prediabetes    Primary hypertension    Reactive airway disease 12/23/2021   Seizure disorder ([place])    Severe intellectual disabilities    Thyroid nodule 11/01/2019   Urinary incontinence due to cognitive impairment 04/30/2021   Past Surgical History:  Procedure Laterality Date   LAPAROTOMY  2021    Family History  Family history unknown: Yes   Social History   Socioeconomic History   Marital status: Single     Spouse name: Not on file   Number of children: Not on file   Years of education: Not on file   Highest education level: Not on file  Occupational History   Not on file  Tobacco Use   Smoking status: Never   Smokeless tobacco: Never  Substance and Sexual Activity   Alcohol use: Never   Drug use: Never   Sexual activity: Not on file  Other Topics Concern   Not on file  Social History Narrative   Not on file   Social Determinants of Health   Financial Resource Strain: Not  on file  Food Insecurity: Not on file  Transportation Needs: Not on file  Physical Activity: Not on file  Stress: Not on file  Social Connections: Not on file    Review of Systems  Unable to perform ROS: Patient nonverbal   Objective:  BP 122/80   Pulse 88   Temp (!) 96.9 F (36.1 C)   Resp 16   Ht '5\' 9"'$  (1.753 m)   Wt 175 lb (79.4 kg) Comment: Patient would not place both feet completely on scale.  SpO2 100%   BMI 25.84 kg/m       05/28/2022    1:31 PM 04/30/2022    9:24 AM 03/28/2022    9:35 AM  BP/Weight  Systolic BP 779 390 300  Diastolic BP 70 80 68  Wt. (Lbs) 194 184.8 185  BMI 28.65 kg/m2 27.29 kg/m2 27.32 kg/m2    Physical Exam Vitals reviewed.  HENT:     Right Ear: There is impacted cerumen.     Left Ear: There is impacted cerumen.  Cardiovascular:     Rate and Rhythm: Normal rate and regular rhythm.     Pulses: Normal pulses.     Heart sounds: Normal heart sounds.  Pulmonary:     Effort: Pulmonary effort is normal.     Breath sounds: Normal breath sounds.  Abdominal:     General: Bowel sounds are normal.  Skin:    General: Skin is warm.     Capillary Refill: Capillary refill takes less than 2 seconds.  Neurological:     Mental Status: He is alert. Mental status is at baseline.      Lab Results  Component Value Date   WBC 5.8 03/28/2022   HGB 14.7 03/28/2022   HCT 44.0 03/28/2022   PLT 245 03/28/2022   GLUCOSE 94 03/28/2022   CHOL 149 03/28/2022   TRIG 84  03/28/2022   HDL 29 (L) 03/28/2022   LDLCALC 104 (H) 03/28/2022   ALT 16 03/28/2022   AST 20 03/28/2022   NA 143 03/28/2022   K 4.2 03/28/2022   CL 106 03/28/2022   CREATININE 0.78 03/28/2022   BUN 9 03/28/2022   CO2 22 03/28/2022   TSH 1.510 03/28/2022   INR 1.1 12/01/2008   HGBA1C 5.9 (H) 03/28/2022      Assessment & Plan:   1. Mixed hyperlipidemia-well controlled - Comprehensive metabolic panel - CBC with Differential/Platelet - Lipid panel  2. Prediabetes-well controlled - Comprehensive metabolic panel - CBC with Differential/Platelet - Hemoglobin A1c  3. Essential hypertension-well controlled - Comprehensive metabolic panel - CBC with Differential/Platelet  4. Autism - Comprehensive metabolic panel - CBC with Differential/Platelet  5. History of aspiration pneumonia - Comprehensive metabolic panel - CBC with Differential/Platelet -continue pureed diet as prescribed  6. Seizure disorder (HCC)-well controlled - Comprehensive metabolic panel - CBC with Differential/Platelet  7. Severe intellectual disabilities - Comprehensive metabolic panel - CBC with Differential/Platelet   Continue medications as prescribed We will call with lab results Follow-up after 08/29/22 for physical and FL2 form completion    Follow-up: CPE and FL2 after 08/29/22  An After Visit Summary was printed and given to the patient.  Rip Harbour, NP [place] [phone]

## 2022-08-13 NOTE — Patient Instructions (Signed)
Continue medications as prescribed We will call with lab results Follow-up after 08/29/22 for physical and FL2 form completion  Prediabetes Prediabetes is when your blood sugar (blood glucose) level is higher than normal but not high enough for you to be diagnosed with type 2 diabetes. Having prediabetes puts you at risk for developing type 2 diabetes (type 2 diabetes mellitus). With certain lifestyle changes, you may be able to prevent or delay the onset of type 2 diabetes. This is important because type 2 diabetes can lead to serious complications, such as: Heart disease. Stroke. Blindness. Kidney disease. Depression. Poor circulation in the feet and legs. In severe cases, this could lead to surgical removal of a leg (amputation). What are the causes? The exact cause of prediabetes is not known. It may result from insulin resistance. Insulin resistance develops when cells in the body do not respond properly to insulin that the body makes. This can cause excess glucose to build up in the blood. High blood glucose (hyperglycemia) can develop. What increases the risk? The following factors may make you more likely to develop this condition: You have a family member with type 2 diabetes. You are older than 45 years. You had a temporary form of diabetes during a pregnancy (gestational diabetes). You had polycystic ovary syndrome (PCOS). You are overweight or obese. You are inactive (sedentary). You have a history of heart disease, including problems with cholesterol levels, high levels of blood fats, or high blood pressure. What are the signs or symptoms? You may have no symptoms. If you do have symptoms, they may include: Increased hunger. Increased thirst. Increased urination. Vision changes, such as blurry vision. Tiredness (fatigue). How is this diagnosed? This condition can be diagnosed with blood tests. Your blood glucose may be checked with one or more of the following tests: A  fasting blood glucose (FBG) test. You will not be allowed to eat (you will fast) for at least 8 hours before a blood sample is taken. An A1C blood test (hemoglobin A1C). This test provides information about blood glucose levels over the previous 2?3 months. An oral glucose tolerance test (OGTT). This test measures your blood glucose at two points in time: After fasting. This is your baseline level. Two hours after you drink a beverage that contains glucose. You may be diagnosed with prediabetes if: Your FBG is 100?125 mg/dL (5.6-6.9 mmol/L). Your A1C level is 5.7?6.4% (39-46 mmol/mol). Your OGTT result is 140?199 mg/dL (7.8-11 mmol/L). These blood tests may be repeated to confirm your diagnosis. How is this treated? Treatment may include dietary and lifestyle changes to help lower your blood glucose and prevent type 2 diabetes from developing. In some cases, medicine may be prescribed to help lower the risk of type 2 diabetes. Follow these instructions at home: Nutrition  Follow a healthy meal plan. This includes eating lean proteins, whole grains, legumes, fresh fruits and vegetables, low-fat dairy products, and healthy fats. Follow instructions from your health care provider about eating or drinking restrictions. Meet with a dietitian to create a healthy eating plan that is right for you. Lifestyle Do moderate-intensity exercise for at least 30 minutes a day on 5 or more days each week, or as told by your health care provider. A mix of activities may be best, such as: Brisk walking, swimming, biking, and weight lifting. Lose weight as told by your health care provider. Losing 5-7% of your body weight can reverse insulin resistance. Do not drink alcohol if: Your health care provider tells  you not to drink. You are pregnant, may be pregnant, or are planning to become pregnant. If you drink alcohol: Limit how much you use to: 0-1 drink a day for women. 0-2 drinks a day for men. Be aware  of how much alcohol is in your drink. In the [place] one drink equals one 12 oz bottle of beer (355 mL), one 5 oz glass of wine (148 mL), or one 1 oz glass of hard liquor (44 mL). General instructions Take over-the-counter and prescription medicines only as told by your health care provider. You may be prescribed medicines that help lower the risk of type 2 diabetes. Do not use any products that contain nicotine or tobacco, such as cigarettes, e-cigarettes, and chewing tobacco. If you need help quitting, ask your health care provider. Keep all follow-up visits. This is important. Where to find more information American Diabetes Association: www.diabetes.org Academy of Nutrition and Dietetics: www.eatright.org American Heart Association: www.heart.org Contact a health care provider if: You have any of these symptoms: Increased hunger. Increased urination. Increased thirst. Fatigue. Vision changes, such as blurry vision. Get help right away if you: Have shortness of breath. Feel confused. Vomit or feel like you may vomit. Summary Prediabetes is when your blood sugar (blood glucose)level is higher than normal but not high enough for you to be diagnosed with type 2 diabetes. Having prediabetes puts you at risk for developing type 2 diabetes (type 2 diabetes mellitus). Make lifestyle changes such as eating a healthy diet and exercising regularly to help prevent diabetes. Lose weight as told by your health care provider. This information is not intended to replace advice given to you by your health care provider. Make sure you discuss any questions you have with your health care provider. Document Revised: 10/13/2019 Document Reviewed: 10/13/2019 Elsevier Patient Education  [place].

## 2022-08-14 LAB — COMPREHENSIVE METABOLIC PANEL
ALT: 17 IU/L (ref 0–44)
AST: 22 IU/L (ref 0–40)
Albumin/Globulin Ratio: 1.6 (ref 1.2–2.2)
Albumin: 4.1 g/dL (ref 3.8–4.9)
Alkaline Phosphatase: 101 IU/L (ref 44–121)
BUN/Creatinine Ratio: 16 (ref 10–24)
BUN: 13 mg/dL (ref 8–27)
Bilirubin Total: 0.5 mg/dL (ref 0.0–1.2)
CO2: 19 mmol/L — ABNORMAL LOW (ref 20–29)
Calcium: 9.4 mg/dL (ref 8.6–10.2)
Chloride: 105 mmol/L (ref 96–106)
Creatinine, Ser: 0.82 mg/dL (ref 0.76–1.27)
Globulin, Total: 2.6 g/dL (ref 1.5–4.5)
Glucose: 81 mg/dL (ref 70–99)
Potassium: 4.5 mmol/L (ref 3.5–5.2)
Sodium: 144 mmol/L (ref 134–144)
Total Protein: 6.7 g/dL (ref 6.0–8.5)
eGFR: 101 mL/min/{1.73_m2} (ref >59)

## 2022-08-14 LAB — CBC WITH DIFFERENTIAL/PLATELET
Basophils Absolute: 0.1 10*3/uL (ref 0.0–0.2)
Basos: 1 %
EOS (ABSOLUTE): 0.2 10*3/uL (ref 0.0–0.4)
Eos: 2 %
Hematocrit: 42.3 % (ref 37.5–51.0)
Hemoglobin: 14.6 g/dL (ref 13.0–17.7)
Immature Grans (Abs): 0 10*3/uL (ref 0.0–0.1)
Immature Granulocytes: 0 %
Lymphocytes Absolute: 2 10*3/uL (ref 0.7–3.1)
Lymphs: 28 %
MCH: 30.2 pg (ref 26.6–33.0)
MCHC: 34.5 g/dL (ref 31.5–35.7)
MCV: 87 fL (ref 79–97)
Monocytes Absolute: 0.7 10*3/uL (ref 0.1–0.9)
Monocytes: 10 %
Neutrophils Absolute: 4.2 10*3/uL (ref 1.4–7.0)
Neutrophils: 59 %
Platelets: 283 10*3/uL (ref 150–450)
RBC: 4.84 x10E6/uL (ref 4.14–5.80)
RDW: 12.2 % (ref 11.6–15.4)
WBC: 7.2 10*3/uL (ref 3.4–10.8)

## 2022-08-14 LAB — HEMOGLOBIN A1C
Est. average glucose Bld gHb Est-mCnc: 123 mg/dL
Hgb A1c MFr Bld: 5.9 % — ABNORMAL HIGH (ref 4.8–5.6)

## 2022-08-14 LAB — LIPID PANEL
Chol/HDL Ratio: 4.5 ratio (ref 0.0–5.0)
Cholesterol, Total: 145 mg/dL (ref 100–199)
HDL: 32 mg/dL — ABNORMAL LOW (ref >39)
LDL Chol Calc (NIH): 99 mg/dL (ref 0–99)
Triglycerides: 69 mg/dL (ref 0–149)
VLDL Cholesterol Cal: 14 mg/dL (ref 5–40)

## 2022-08-14 LAB — CARDIOVASCULAR RISK ASSESSMENT

## 2022-09-01 ENCOUNTER — Encounter: Payer: Self-pay | Admitting: Family Medicine

## 2022-09-01 ENCOUNTER — Ambulatory Visit (INDEPENDENT_AMBULATORY_CARE_PROVIDER_SITE_OTHER): Payer: Medicare Other | Admitting: Family Medicine

## 2022-09-01 VITALS — BP 78/62 | HR 102 | Temp 97.0°F | Ht 69.0 in | Wt 173.0 lb

## 2022-09-01 DIAGNOSIS — I9589 Other hypotension: Secondary | ICD-10-CM

## 2022-09-01 DIAGNOSIS — Z0000 Encounter for general adult medical examination without abnormal findings: Secondary | ICD-10-CM | POA: Insufficient documentation

## 2022-09-01 HISTORY — DX: Encounter for general adult medical examination without abnormal findings: Z00.00

## 2022-09-01 NOTE — Progress Notes (Signed)
Subjective:  Patient ID: [name], male    DOB: 05-16-1961  Age: 62 y.o. MRN: [id number]  Chief Complaint  Patient presents with   FL2 form     HPI  [name] is a 62 year old mentally disabled white male who lives in a group home.  Past medical history includes hypertension, hyperlipidemia, and severe intellectual disabilities.  He wears depends for bowel incontinence.  Has occasional t bladder incontinence.  Able to feed himself. Not able to bathe independently.  Follows simple instructions.  Oddly enough his blood pressure is very low today.  His Bp this morning was 153/90. Given his routine valsartan 40 mg am dose. Now his bp is 80/64. He is acting at his baseline.  [name] is nonverbal other than some moaning.  Typically his moaning is quiet when he does not feel well in our experience.  He likes to walk around our office and has difficulty sitting still sometimes.  When he does not feel well this behavior stops.  The patient is exhibiting his normal behavioral patterns.  He has been eating and drinking well today.  We did try to give him some water in the office however apparently he prefers flavored water.  He goes to vocational rehab during the day and has a Armed forces technical officer there.  He has had no falls.  He does undergo biannual dental cleanings, brushes teeth daily, and has a dental rinse he uses daily.  He wears a seatbelt.  He lives in a group home that is regulated and so will have smoke detectors.  No guns are allowed in the home.  He does not see an eye doctor as it would be very difficult to assess his vision since he is nonverbal.  The patient was here 2 weeks ago for chronic follow-up during which she had blood work.  His blood work looked very good at that time.          03/26/2020    1:58 PM 08/21/2021   11:28 AM 09/01/2022   11:22 AM  Fall Risk  Falls in the past year? 0 0 1  Was there an injury with Fall? 0 0 0  Fall Risk Category Calculator 0 0 1  Fall Risk Category  (Retired) Low Low   (RETIRED) Patient Fall Risk Level Low fall risk Low fall risk   Patient at Risk for Falls Due to  No Fall Risks Impaired mobility  Fall risk Follow up  Falls evaluation completed;Falls prevention discussed Falls evaluation completed;Education provided              Past Medical History:  Diagnosis Date   Acute bronchitis due to other specified organisms 12/23/2021   Autism    BMI 30.0-30.9,adult 09/26/2019   Chronic idiopathic constipation 06/01/2020   Depression, major, recurrent, mild ([place]) 09/26/2019   Developmental non-verbal disorder    Encounter for removal of sutures 03/28/2022   Essential hypertension    Fall on same level 03/28/2022   Gastroesophageal reflux disease without esophagitis 09/26/2019   Hypotension 11/20/2021   Laceration of occipital region of scalp 03/28/2022   Mixed hyperlipidemia    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   Pedal edema 04/10/2021   Prediabetes    Primary hypertension    Reactive airway disease 12/23/2021   Seizure disorder ([place])    Severe intellectual disabilities    Thyroid nodule 11/01/2019   Urinary incontinence due to cognitive impairment 04/30/2021   Past Surgical History:  Procedure Laterality Date   LAPAROTOMY  2021  Family History  Family history unknown: Yes   Social History   Socioeconomic History   Marital status: Single    Spouse name: Not on file   Number of children: Not on file   Years of education: Not on file   Highest education level: Not on file  Occupational History   Not on file  Tobacco Use   Smoking status: Never   Smokeless tobacco: Never  Substance and Sexual Activity   Alcohol use: Never   Drug use: Never   Sexual activity: Not on file  Other Topics Concern   Not on file  Social History Narrative   Not on file   Social Determinants of Health   Financial Resource Strain: Not on file  Food Insecurity: Not on file  Transportation Needs: Not on file  Physical Activity: Not on file  Stress: Not on  file  Social Connections: Not on file   Review of Systems  Constitutional:  Negative for appetite change, fatigue and fever.  HENT:  Negative for congestion, ear pain, sinus pressure and sore throat.   Respiratory:  Negative for cough, chest tightness, shortness of breath and wheezing.   Cardiovascular:  Negative for chest pain and palpitations.  Gastrointestinal:  Negative for abdominal pain, constipation, diarrhea, nausea and vomiting.  Genitourinary:  Negative for dysuria and hematuria.  Musculoskeletal:  Negative for arthralgias, back pain, joint swelling and myalgias.  Skin:  Negative for rash.  Neurological:  Negative for dizziness, weakness and headaches.  Psychiatric/Behavioral:  Negative for dysphoric mood. The patient is not nervous/anxious.      Objective:  BP (!) 78/62   Pulse (!) 102   Temp (!) 97 F (36.1 C) (Temporal)   Ht '5\' 9"'$  (1.753 m)   Wt 173 lb (78.5 kg)   SpO2 95%   BMI 25.55 kg/m      09/01/2022   11:45 AM 09/01/2022   11:30 AM 09/01/2022   11:27 AM  BP/Weight  Systolic BP 78 84 80  Diastolic BP 62 60 64  Wt. (Lbs)   173  BMI   25.55 kg/m2    Physical Exam Vitals reviewed.  Constitutional:      Appearance: Normal appearance. He is normal weight.  Neck:     Vascular: No carotid bruit.  Cardiovascular:     Rate and Rhythm: Normal rate and regular rhythm.     Heart sounds: Normal heart sounds.  Pulmonary:     Effort: Pulmonary effort is normal.     Breath sounds: Normal breath sounds.  Abdominal:     General: Abdomen is flat. Bowel sounds are normal.     Palpations: Abdomen is soft.     Tenderness: There is no abdominal tenderness.  Skin:    General: Skin is dry.  Neurological:     Mental Status: He is alert.  Psychiatric:     Comments: Behavior is baseline.      Lab Results  Component Value Date   WBC 7.2 08/13/2022   HGB 14.6 08/13/2022   HCT 42.3 08/13/2022   PLT 283 08/13/2022   GLUCOSE 81 08/13/2022   CHOL 145 08/13/2022    TRIG 69 08/13/2022   HDL 32 (L) 08/13/2022   LDLCALC 99 08/13/2022   ALT 17 08/13/2022   AST 22 08/13/2022   NA 144 08/13/2022   K 4.5 08/13/2022   CL 105 08/13/2022   CREATININE 0.82 08/13/2022   BUN 13 08/13/2022   CO2 19 (L) 08/13/2022   TSH 1.510  03/28/2022   INR 1.1 12/01/2008   HGBA1C 5.9 (H) 08/13/2022      Assessment & Plan:   Encounter for Medicare annual wellness exam Assessment & Plan: Baseline.  Continue care in group home.  Continue to eat healthy.  Push fluids.     Other specified hypotension Assessment & Plan: STOP VALSARTAN.  I tried to have him drink water in the office, which he took a little.  I recommended they push fluids and have him take it easy...sitting or laying down. Check bp twice daily and call if sbp < 80.  Orders: -     CBC with Differential/Platelet -     Comprehensive metabolic panel     Body mass index is 25.55 kg/m.   This is a list of the screening recommended for you and due dates:  Health Maintenance  Topic Date Due   Hepatitis C Screening: USPSTF Recommendation to screen - Ages 8-79 yo.  Never done   Zoster (Shingles) Vaccine (1 of 2) Never done   COVID-19 Vaccine (2 - Moderna risk series) 09/15/2019   Medicare Annual Wellness Visit  09/02/2023   Colon Cancer Screening  03/08/2024   DTaP/Tdap/Td vaccine (3 - Td or Tdap) 03/22/2032   Flu Shot  Completed   HPV Vaccine  Aged Out   HIV Screening  Discontinued     Follow-up: Return in about 3 days (around 09/04/2022) for BP CHECK.  An After Visit Summary was printed and given to the patient.  I,Lauren M Auman,acting as a scribe for Rochel Brome, MD.,have documented all relevant documentation on the behalf of Rochel Brome, MD,as directed by  Rochel Brome, MD while in the presence of Rochel Brome, MD.   I attest that I have reviewed this visit and agree with the plan scribed by my staff.  Rochel Brome, MD [facility] [phone]

## 2022-09-01 NOTE — Assessment & Plan Note (Signed)
Baseline.  Continue care in group home.  Continue to eat healthy.  Push fluids.

## 2022-09-01 NOTE — Assessment & Plan Note (Addendum)
STOP VALSARTAN.  I tried to have him drink water in the office, which he took a little.  I recommended they push fluids and have him take it easy...sitting or laying down. Check bp twice daily and call if sbp < 80.

## 2022-09-01 NOTE — Patient Instructions (Addendum)
STOP VALSARTAN.  CHECK HIS BP TWICE DAILY (AM AND AROUND 4 PM)  X 1 WEEK.  CALL WITH RESULTS.  IF SYSTOLIC BP < 80.

## 2022-09-03 DIAGNOSIS — Z0279 Encounter for issue of other medical certificate: Secondary | ICD-10-CM

## 2022-09-04 ENCOUNTER — Other Ambulatory Visit: Payer: Self-pay | Admitting: Family Medicine

## 2022-09-04 ENCOUNTER — Ambulatory Visit: Payer: Medicare Other

## 2022-09-04 NOTE — Progress Notes (Signed)
Per Provider- BP readings are better, keep bp med discontinued and keep checking bp readings per Dr. Tobie Poet.

## 2022-09-23 ENCOUNTER — Telehealth: Payer: Self-pay

## 2022-09-23 NOTE — Telephone Encounter (Signed)
Danny's caregiver called today to let us know that [name] removed his shoes at his Day Program and his nails are very thick and need cutting.  They were advised to call podiatry and get an appointment.

## 2022-09-26 ENCOUNTER — Ambulatory Visit (INDEPENDENT_AMBULATORY_CARE_PROVIDER_SITE_OTHER): Payer: Medicare Other | Admitting: Podiatry

## 2022-09-26 ENCOUNTER — Encounter: Payer: Self-pay | Admitting: Podiatry

## 2022-09-26 DIAGNOSIS — B351 Tinea unguium: Secondary | ICD-10-CM

## 2022-09-26 DIAGNOSIS — M79675 Pain in left toe(s): Secondary | ICD-10-CM | POA: Diagnosis not present

## 2022-09-26 DIAGNOSIS — M79674 Pain in right toe(s): Secondary | ICD-10-CM | POA: Diagnosis not present

## 2022-09-26 NOTE — Progress Notes (Signed)
  Subjective:  Patient ID: [name], male    DOB: 1961/02/06,  MRN: [id number]  Chief Complaint  Patient presents with   Nail Problem    np - right foot 3rd toe bent up / rfc nail trim    62 y.o. male presents with the above complaint. History confirmed with patient. Patient presenting with pain related to dystrophic thickened elongated nails. Patient is unable to trim own nails related to nail dystrophy and/or mobility issues. Patient does not have a history of T2DM. Patient does have intellectual disability that prevents him from trimming his own nails.  Objective:  Physical Exam: warm, good capillary refill nail exam onychomycosis of the toenails, onycholysis, and dystrophic nails DP pulses palpable, PT pulses palpable, and protective sensation intact Left Foot:  Pain with palpation of nails due to elongation and dystrophic growth.  Right Foot: Pain with palpation of nails due to elongation and dystrophic growth.   Assessment:   1. Pain due to onychomycosis of toenails of both feet      Plan:  Patient was evaluated and treated and all questions answered.   #Onychomycosis with pain  -Nails palliatively debrided as below. -Educated on self-care -As patient is with intellectual disability we will plan to trim his nails for him on a routine basis every 3 months  Procedure: Nail Debridement Rationale: Pain Type of Debridement: manual, sharp debridement. Instrumentation: Nail nipper, rotary burr. Number of Nails: 10  Return in about 3 months (around 12/27/2022) for RFC.         Everitt Amber, DPM [facility]

## 2022-09-29 ENCOUNTER — Other Ambulatory Visit: Payer: Self-pay | Admitting: Family Medicine

## 2022-10-30 ENCOUNTER — Other Ambulatory Visit: Payer: Self-pay | Admitting: Family Medicine

## 2022-12-29 ENCOUNTER — Ambulatory Visit (INDEPENDENT_AMBULATORY_CARE_PROVIDER_SITE_OTHER): Payer: Medicare Other | Admitting: Podiatry

## 2022-12-29 DIAGNOSIS — M79674 Pain in right toe(s): Secondary | ICD-10-CM

## 2022-12-29 DIAGNOSIS — B351 Tinea unguium: Secondary | ICD-10-CM

## 2022-12-29 DIAGNOSIS — M79675 Pain in left toe(s): Secondary | ICD-10-CM

## 2022-12-29 NOTE — Progress Notes (Signed)
  Subjective:  Patient ID: [name], male    DOB: 19-Jan-1961,  MRN: [id number]  Chief Complaint  Patient presents with   Nail Problem    Routine Foot Care    62 y.o. male presents with the above complaint. History confirmed with patient. Patient presenting with pain related to dystrophic thickened elongated nails. Patient is unable to trim own nails related to nail dystrophy and/or mobility issues. Patient does not have a history of T2DM. Patient does have intellectual disability that prevents him from trimming his own nails.  Objective:  Physical Exam: warm, good capillary refill nail exam onychomycosis of the toenails, onycholysis, and dystrophic nails DP pulses palpable, PT pulses palpable, and protective sensation intact Left Foot:  Pain with palpation of nails due to elongation and dystrophic growth.  Right Foot: Pain with palpation of nails due to elongation and dystrophic growth.   Assessment:   1. Pain due to onychomycosis of toenails of both feet       Plan:  Patient was evaluated and treated and all questions answered.   #Onychomycosis with pain  -Nails palliatively debrided as below. -Educated on self-care -As patient is with intellectual disability we will plan to trim his nails for him on a routine basis every 3 months -Patient did not want his nails trimmed at this appointment and was pulling away.  Will try again at 3 months more.  Procedure: Nail Debridement - Attempted Rationale: Pain Type of Debridement: manual, sharp debridement. Instrumentation: Nail nipper, rotary burr. Number of Nails: 1  No follow-ups on file.         Corinna Gab, DPM [facility]

## 2023-02-26 ENCOUNTER — Other Ambulatory Visit: Payer: Self-pay | Admitting: Family Medicine

## 2023-03-31 ENCOUNTER — Ambulatory Visit (INDEPENDENT_AMBULATORY_CARE_PROVIDER_SITE_OTHER): Payer: Medicare Other | Admitting: Podiatry

## 2023-03-31 DIAGNOSIS — Z91199 Patient's noncompliance with other medical treatment and regimen due to unspecified reason: Secondary | ICD-10-CM

## 2023-03-31 NOTE — Progress Notes (Signed)
 Patient absent for apointment

## 2023-04-20 ENCOUNTER — Telehealth: Payer: Self-pay

## 2023-04-21 NOTE — Telephone Encounter (Signed)
[place] [employer] [facility] DISP BRIEFS/UNDERWEAR, GLOVES, UNDERPADS

## 2023-04-25 ENCOUNTER — Other Ambulatory Visit: Payer: Self-pay | Admitting: Family Medicine

## 2023-05-26 ENCOUNTER — Other Ambulatory Visit: Payer: Self-pay | Admitting: Family Medicine

## 2023-05-26 DIAGNOSIS — F79 Unspecified intellectual disabilities: Secondary | ICD-10-CM

## 2023-06-03 ENCOUNTER — Other Ambulatory Visit: Payer: Self-pay | Admitting: Family Medicine

## 2023-06-10 ENCOUNTER — Encounter: Payer: Self-pay | Admitting: Cardiology

## 2023-06-10 ENCOUNTER — Ambulatory Visit: Payer: Medicare Other | Attending: Cardiology | Admitting: Cardiology

## 2023-06-10 VITALS — BP 120/70 | HR 82 | Ht 72.0 in | Wt 164.2 lb

## 2023-06-10 DIAGNOSIS — I10 Essential (primary) hypertension: Secondary | ICD-10-CM | POA: Diagnosis present

## 2023-06-10 DIAGNOSIS — E782 Mixed hyperlipidemia: Secondary | ICD-10-CM | POA: Diagnosis present

## 2023-06-10 NOTE — Progress Notes (Signed)
Cardiology Office Note:    Date:  06/10/2023   ID:  [name], DOB 09-24-1960, MRN [id number]  PCP:  Blane Ohara, MD  Cardiologist:  Garwin Brothers, MD   Referring MD: Blane Ohara, MD    ASSESSMENT:    1. Mixed hyperlipidemia   2. Essential hypertension    PLAN:    In order of problems listed above:  Primary prevention stressed with the patient.  Importance of compliance with diet medication stressed and patient verbalized standing. Essential hypertension: Blood pressure is stable and diet was emphasized.  Lifestyle modification urged. Mixed dyslipidemia: On lipid-lowering medications followed by primary care.  Lipids reviewed from [facility] sheet. He will be seen in follow-up appointment on a as needed basis.   Medication Adjustments/Labs and Tests Ordered: Current medicines are reviewed at length with the patient today.  Concerns regarding medicines are outlined above.  Orders Placed This Encounter  Procedures   EKG 12-Lead   No orders of the defined types were placed in this encounter.    No chief complaint on file.    History of Present Illness:    [name] is a 62 y.o. male.  Patient is brought in from her facility.  He has severe intellectual disabilities.  He denies and his attendant denies any issues at this time.  No chest pain orthopnea or PND.  He has history of hypertension and dyslipidemia.  At the time of my evaluation, the patient is alert awake oriented and in no distress.  Past Medical History:  Diagnosis Date   Acute bronchitis due to other specified organisms 12/23/2021   Autism    BMI 30.0-30.9,adult 09/26/2019   Chronic idiopathic constipation 06/01/2020   Depression, major, recurrent, mild ([facility]) 09/26/2019   Developmental non-verbal disorder    Encounter for Medicare annual wellness exam 09/01/2022   Encounter for removal of sutures 03/28/2022   Essential hypertension    Fall on same level 03/28/2022   Gastroesophageal  reflux disease without esophagitis 09/26/2019   Hypotension 11/20/2021   Laceration of occipital region of scalp 03/28/2022   Mixed hyperlipidemia    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   Pedal edema 04/10/2021   Prediabetes    Primary hypertension    Reactive airway disease 12/23/2021   Seizure disorder ([facility])    Severe intellectual disabilities    Thyroid nodule 11/01/2019   Urinary incontinence due to cognitive impairment 04/30/2021    Past Surgical History:  Procedure Laterality Date   LAPAROTOMY  2021    Current Medications: Current Meds  Medication Sig   albuterol (PROVENTIL) (2.5 MG/3ML) 0.083% nebulizer solution Take 3 mLs (2.5 mg total) by nebulization every 6 (six) hours as needed for wheezing or shortness of breath.   atorvastatin (LIPITOR) 20 MG tablet TAKE 1 TABLET BY MOUTH ONCE DAILY   citalopram (CELEXA) 40 MG tablet TAKE ONE TABLET BY MOUTH EVERY MORNING   clonazePAM (KLONOPIN) 0.5 MG tablet Take 1 tablet every morning, 1 at noon, 1/2 at 4 pm and 1 at bedtime   docusate sodium (COLACE) 100 MG capsule Take 100 mg by mouth 2 (two) times daily as needed for mild constipation.   feeding supplement, ENSURE COMPLETE, (ENSURE COMPLETE) LIQD Take 237 mLs by mouth daily with breakfast.   lactulose (CHRONULAC) 10 GM/15ML solution TAKE (30GM) BY MOUTH THREE TIMES DAILY   loratadine (CLARITIN) 10 MG tablet Take 10 mg by mouth daily.   montelukast (SINGULAIR) 10 MG tablet TAKE 1 TABLET BY MOUTH ONCE DAILY  Multiple Vitamin (MULTIVITAMIN) tablet Take 1 tablet by mouth daily.   nystatin cream (MYCOSTATIN) Apply 1 application topically 2 (two) times daily. X  2   pantoprazole (PROTONIX) 40 MG tablet TAKE 1 TABLET BY MOUTH TWICE DAILY  * DO NOT CRUSH *   polyethylene glycol (MIRALAX) 17 g packet Take 17 gm daily in liquid at noon and 6 pm   QUEtiapine (SEROQUEL) 200 MG tablet Take 200 mg by mouth at bedtime.   sodium phosphate (FLEET) 7-19 GM/118ML ENEM Place 133 mLs (1 enema total)  rectally daily as needed for severe constipation. Dose 4.5 OZ Q72 H PRN.   Starch, Thickening, LIQD 3 tsps per 4 oz of liquid. Recommend 12 oz liquid (water,tea, or juice) three times a day.   valsartan (DIOVAN) 40 MG tablet Take 1 tablet (40 mg total) by mouth daily.   Wheat Dextrin (BENEFIBER ON THE GO) POWD TAKE AS DIRECTED ONCE DAILY     Allergies:   Augmentin [amoxicillin-pot clavulanate]   Social History   Socioeconomic History   Marital status: Single    Spouse name: Not on file   Number of children: Not on file   Years of education: Not on file   Highest education level: Not on file  Occupational History   Not on file  Tobacco Use   Smoking status: Never   Smokeless tobacco: Never  Substance and Sexual Activity   Alcohol use: Never   Drug use: Never   Sexual activity: Not on file  Other Topics Concern   Not on file  Social History Narrative   Not on file   Social Determinants of Health   Financial Resource Strain: Not on file  Food Insecurity: Not on file  Transportation Needs: Not on file  Physical Activity: Not on file  Stress: Not on file  Social Connections: Not on file     Family History: The patient's Family history is unknown by patient.  ROS:   Please see the history of present illness.    All other systems reviewed and are negative.  EKGs/Labs/Other Studies Reviewed:    The following studies were reviewed today: .Marland KitchenEKG Interpretation Date/Time:  Wednesday June 10 2023 14:38:39 EST Ventricular Rate:  82 PR Interval:  152 QRS Duration:  88 QT Interval:  402 QTC Calculation: 469 R Axis:   70  Text Interpretation: Normal sinus rhythm Cannot rule out Anterior infarct , age undetermined Abnormal ECG No previous ECGs available Confirmed by Belva Crome [phone]) on 06/10/2023 1:51:56 PM     Recent Labs: 08/13/2022: ALT 17; BUN 13; Creatinine, Ser 0.82; Hemoglobin 14.6; Platelets 283; Potassium 4.5; Sodium 144  Recent Lipid Panel     Component Value Date/Time   CHOL 145 08/13/2022 1023   TRIG 69 08/13/2022 1023   HDL 32 (L) 08/13/2022 1023   CHOLHDL 4.5 08/13/2022 1023   LDLCALC 99 08/13/2022 1023    Physical Exam:    VS:  BP 120/70   Pulse 82   Ht 6' (1.829 m)   Wt 164 lb 3.2 oz (74.5 kg)   SpO2 93%   BMI 22.27 kg/m     Wt Readings from Last 3 Encounters:  06/10/23 164 lb 3.2 oz (74.5 kg)  09/01/22 173 lb (78.5 kg)  08/13/22 175 lb (79.4 kg)     GEN: Patient is in no acute distress HEENT: Normal NECK: No JVD; No carotid bruits LYMPHATICS: No lymphadenopathy CARDIAC: Hear sounds regular, 2/6 systolic murmur at the apex. RESPIRATORY:  Clear to  auscultation without rales, wheezing or rhonchi  ABDOMEN: Soft, non-tender, non-distended MUSCULOSKELETAL:  No edema; No deformity  SKIN: Warm and dry NEUROLOGIC:  Alert and oriented x 3 PSYCHIATRIC:  Normal affect   Signed, Garwin Brothers, MD  06/10/2023 2:04 PM    [facility]

## 2023-06-10 NOTE — Patient Instructions (Signed)
Medication Instructions:  Your physician recommends that you continue on your current medications as directed. Please refer to the Current Medication list given to you today.  *If you need a refill on your cardiac medications before your next appointment, please call your pharmacy*   Lab Work: None ordered If you have labs (blood work) drawn today and your tests are completely normal, you will receive your results only by: [place] (if you have MyChart) OR A paper copy in the mail If you have any lab test that is abnormal or we need to change your treatment, we will call you to review the results.   Testing/Procedures: None ordered   Follow-Up: At [facility], you and your health needs are our priority.  As part of our continuing mission to provide you with exceptional heart care, we have created designated Provider Care Teams.  These Care Teams include your primary Cardiologist (physician) and Advanced Practice Providers (APPs -  Physician Assistants and Nurse Practitioners) who all work together to provide you with the care you need, when you need it.  We recommend signing up for the patient portal called "MyChart".  Sign up information is provided on this After Visit Summary.  MyChart is used to connect with patients for Virtual Visits (Telemedicine).  Patients are able to view lab/test results, encounter notes, upcoming appointments, etc.  Non-urgent messages can be sent to your provider as well.   To learn more about what you can do with MyChart, go to [email or web address].    Your next appointment:   As needed  The format for your next appointment:   In Person  Provider:   Jyl Heinz, MD    Other Instructions none  Important Information About Sugar

## 2023-06-12 ENCOUNTER — Encounter: Payer: Self-pay | Admitting: Cardiology

## 2023-07-13 NOTE — Progress Notes (Signed)
Acute Office Visit  Subjective:    Patient ID: [name], male    DOB: 12-13-1960, 62 y.o.   MRN: [id number]  Chief Complaint  Patient presents with   Cough/congestion since 07/07/23    Discussed the use of AI scribe software for clinical note transcription with the patient, who gave verbal consent to proceed.   HPI: URI symptoms: The patient presents today with his caregiver with a cough and congestion that started about a week ago. The cough was productive of phlegm on one occasion, but has since been dry. The patient also has a runny nose. The patient's appetite has been fluctuating, with periods of not eating followed by a sudden increase in appetite. The patient has been taking over-the-counter cough medicine with guaifenesin, but this was stopped prior to the visit to allow for an accurate assessment of the cough.  Prediabetes: Patient's caregiver requests labs be drawn today to check A1C. Last A1C 5.9%  Cholesterol: Patient's caregiver requests labs be drawn today to check lipids. Last total cholesterol - 145, HDL - 32, Trig - 69, LDL - 99  Past Medical History:  Diagnosis Date   Acute bronchitis due to other specified organisms 12/23/2021   Autism    BMI 30.0-30.9,adult 09/26/2019   Chronic idiopathic constipation 06/01/2020   Depression, major, recurrent, mild (HCC) 09/26/2019   Developmental non-verbal disorder    Encounter for Medicare annual wellness exam 09/01/2022   Encounter for removal of sutures 03/28/2022   Essential hypertension    Fall on same level 03/28/2022   Gastroesophageal reflux disease without esophagitis 09/26/2019   Hypotension 11/20/2021   Laceration of occipital region of scalp 03/28/2022   Mixed hyperlipidemia    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   Pedal edema 04/10/2021   Prediabetes    Primary hypertension    Reactive airway disease 12/23/2021   Seizure disorder (HCC)    Severe intellectual disabilities    Thyroid nodule 11/01/2019    Urinary incontinence due to cognitive impairment 04/30/2021    Past Surgical History:  Procedure Laterality Date   LAPAROTOMY  2021    Family History  Family history unknown: Yes    Social History   Socioeconomic History   Marital status: Single    Spouse name: Not on file   Number of children: Not on file   Years of education: Not on file   Highest education level: Not on file  Occupational History   Not on file  Tobacco Use   Smoking status: Never   Smokeless tobacco: Never  Substance and Sexual Activity   Alcohol use: Never   Drug use: Never   Sexual activity: Not on file  Other Topics Concern   Not on file  Social History Narrative   ** Merged History Encounter **       Social Drivers of Corporate investment banker Strain: Not on file  Food Insecurity: Not on file  Transportation Needs: Not on file  Physical Activity: Not on file  Stress: Not on file  Social Connections: Not on file  Intimate Partner Violence: Not on file    Outpatient Medications Prior to Visit  Medication Sig Dispense Refill   albuterol (PROVENTIL) (2.5 MG/3ML) 0.083% nebulizer solution Take 3 mLs (2.5 mg total) by nebulization every 6 (six) hours as needed for wheezing or shortness of breath. 150 mL 1   atorvastatin (LIPITOR) 10 MG tablet Take 1 tablet (10 mg total) by mouth daily. 90 tablet 0   atorvastatin (LIPITOR) 20  MG tablet TAKE 1 TABLET BY MOUTH ONCE DAILY 30 tablet 2   citalopram (CELEXA) 40 MG tablet TAKE ONE TABLET BY MOUTH EVERY MORNING 30 tablet 11   clonazePAM (KLONOPIN) 0.5 MG tablet Take 1 tablet every morning, 1 at noon, 1/2 at 4 pm and 1 at bedtime 105 tablet 3   docusate sodium (COLACE) 100 MG capsule Take 100 mg by mouth 2 (two) times daily as needed for mild constipation.     feeding supplement, ENSURE COMPLETE, (ENSURE COMPLETE) LIQD Take 237 mLs by mouth daily with breakfast. 7110 mL 11   lactulose (CHRONULAC) 10 GM/15ML solution TAKE (30GM) BY MOUTH THREE  TIMES DAILY 946 mL 3   loratadine (CLARITIN) 10 MG tablet Take 10 mg by mouth daily.     montelukast (SINGULAIR) 10 MG tablet TAKE 1 TABLET BY MOUTH ONCE DAILY 30 tablet 11   Multiple Vitamin (MULTIVITAMIN) tablet Take 1 tablet by mouth daily. 90 tablet 0   nystatin cream (MYCOSTATIN) Apply 1 application topically 2 (two) times daily. X  2 30 g 0   pantoprazole (PROTONIX) 40 MG tablet TAKE 1 TABLET BY MOUTH TWICE DAILY  * DO NOT CRUSH * 60 tablet 11   polyethylene glycol (MIRALAX) 17 g packet Take 17 gm daily in liquid at noon and 6 pm     QUEtiapine (SEROQUEL) 200 MG tablet Take 200 mg by mouth at bedtime.     sodium phosphate (FLEET) 7-19 GM/118ML ENEM Place 133 mLs (1 enema total) rectally daily as needed for severe constipation. Dose 4.5 OZ Q72 H PRN. 133 mL 6   Starch, Thickening, LIQD 3 tsps per 4 oz of liquid. Recommend 12 oz liquid (water,tea, or juice) three times a day. 237 mL 11   valsartan (DIOVAN) 40 MG tablet Take 1 tablet (40 mg total) by mouth daily. 90 tablet 0   valsartan (DIOVAN) 40 MG tablet Take 1 tablet (40 mg total) by mouth daily. 30 tablet 5   Wheat Dextrin (BENEFIBER ON THE GO) POWD TAKE AS DIRECTED ONCE DAILY 28 each 11   No facility-administered medications prior to visit.    Allergies  Allergen Reactions   Augmentin [Amoxicillin-Pot Clavulanate]     Review of Systems  Constitutional:  Positive for appetite change. Negative for chills, diaphoresis, fatigue and fever.  HENT:  Positive for congestion and rhinorrhea. Negative for ear pain, sinus pressure and sore throat.   Respiratory:  Positive for cough (pruductive). Negative for chest tightness, shortness of breath and wheezing.   Cardiovascular:  Negative for chest pain and palpitations.  Gastrointestinal:  Negative for abdominal pain, constipation, diarrhea, nausea and vomiting.  Genitourinary:  Negative for dysuria and hematuria.  Musculoskeletal:  Negative for arthralgias, back pain, joint swelling and  myalgias.  Skin:  Negative for rash.  Neurological:  Negative for dizziness, weakness and headaches.  Psychiatric/Behavioral:  Negative for dysphoric mood. The patient is not nervous/anxious.        Objective:        07/14/2023    8:57 AM 06/10/2023    1:34 PM 09/04/2022    9:16 AM  Vitals with BMI  Height 6\' 0"  6\' 0"    Weight 163 lbs 164 lbs 3 oz   BMI 22.1 22.26   Systolic 110 120 130  Diastolic 62 70 82  Pulse 78 82     Orthostatic VS for the past 72 hrs (Last 3 readings):  Patient Position BP Location  07/14/23 0857 Sitting Left Arm  Physical Exam Constitutional:      General: He is not in acute distress.    Appearance: He is ill-appearing.  HENT:     Head: Normocephalic.  Eyes:     Conjunctiva/sclera: Conjunctivae normal.  Cardiovascular:     Rate and Rhythm: Regular rhythm.     Heart sounds: Normal heart sounds.  Pulmonary:     Breath sounds: Rhonchi present.  Skin:    General: Skin is warm.  Neurological:     Mental Status: He is alert. Mental status is at baseline.     Health Maintenance Due  Topic Date Due   Hepatitis C Screening  Never done   Zoster Vaccines- Shingrix (1 of 2) Never done   COVID-19 Vaccine (2 - Moderna risk series) 09/15/2019   INFLUENZA VACCINE  02/26/2023   Medicare Annual Wellness (AWV)  09/02/2023    There are no preventive care reminders to display for this patient.   Lab Results  Component Value Date   TSH 1.510 03/28/2022   Lab Results  Component Value Date   WBC 7.2 08/13/2022   HGB 14.6 08/13/2022   HCT 42.3 08/13/2022   MCV 87 08/13/2022   PLT 283 08/13/2022   Lab Results  Component Value Date   NA 144 08/13/2022   K 4.5 08/13/2022   CO2 19 (L) 08/13/2022   GLUCOSE 81 08/13/2022   BUN 13 08/13/2022   CREATININE 0.82 08/13/2022   BILITOT 0.5 08/13/2022   ALKPHOS 101 08/13/2022   AST 22 08/13/2022   ALT 17 08/13/2022   PROT 6.7 08/13/2022   ALBUMIN 4.1 08/13/2022   CALCIUM 9.4 08/13/2022   EGFR  101 08/13/2022   Lab Results  Component Value Date   CHOL 145 08/13/2022   Lab Results  Component Value Date   HDL 32 (L) 08/13/2022   Lab Results  Component Value Date   LDLCALC 99 08/13/2022   Lab Results  Component Value Date   TRIG 69 08/13/2022   Lab Results  Component Value Date   CHOLHDL 4.5 08/13/2022   Lab Results  Component Value Date   HGBA1C 5.9 (H) 08/13/2022       Assessment & Plan:  Upper respiratory infection, acute Assessment & Plan: Cough and congestion for approximately one week with intermittent phlegm production. No fever, chills, or night sweats. Decreased appetite. No difficulty breathing, but tachypnea noted on exam. No current use of antibiotics. -Administered a duoneb via nebulizer in office to help with inflammation and congestion. -Prescribe an antibiotic for the infection. -Schedule a follow-up appointment to monitor progress. - Discussed the possible side effects of medications  Orders: -     Azithromycin; Take 2 tablets on day 1, then 1 tablet daily on days 2 through 5  Dispense: 6 tablet; Refill: 0 -     Ipratropium-Albuterol  Mixed hyperlipidemia Assessment & Plan: Stable  On Lipitor 20 mg once daily Labs drawn today   Orders: -     Lipid panel  Prediabetes Assessment & Plan: Last A1C 5.9% Stable Manages with diet Labs drawn today   Orders: -     Hemoglobin A1c     Meds ordered this encounter  Medications   azithromycin (ZITHROMAX) 250 MG tablet    Sig: Take 2 tablets on day 1, then 1 tablet daily on days 2 through 5    Dispense:  6 tablet    Refill:  0   ipratropium-albuterol (DUONEB) 0.5-2.5 (3) MG/3ML nebulizer solution 3 mL  Orders Placed This Encounter  Procedures   Hemoglobin A1c   Lipid Panel     Follow-up: Return if symptoms worsen or fail to improve.  An After Visit Summary was printed and given to the patient.  Total time spent on today's visit was 35 minutes, including both face-to-face  time and nonface-to-face time personally spent on review of chart (labs and imaging), discussing labs and goals, discussing further work-up, treatment options, referrals to specialist if needed, reviewing outside records if pertinent, answering patient's questions, and coordinating care.    Lajuana Matte, FNP [facility] [phone]

## 2023-07-14 ENCOUNTER — Encounter: Payer: Self-pay | Admitting: Family Medicine

## 2023-07-14 ENCOUNTER — Ambulatory Visit (INDEPENDENT_AMBULATORY_CARE_PROVIDER_SITE_OTHER): Payer: Medicare Other | Admitting: Family Medicine

## 2023-07-14 VITALS — BP 110/62 | HR 78 | Temp 97.8°F | Ht 72.0 in | Wt 163.0 lb

## 2023-07-14 DIAGNOSIS — R7303 Prediabetes: Secondary | ICD-10-CM

## 2023-07-14 DIAGNOSIS — E782 Mixed hyperlipidemia: Secondary | ICD-10-CM | POA: Diagnosis not present

## 2023-07-14 DIAGNOSIS — J069 Acute upper respiratory infection, unspecified: Secondary | ICD-10-CM | POA: Diagnosis not present

## 2023-07-14 LAB — LIPID PANEL
Chol/HDL Ratio: 4.1 {ratio} (ref 0.0–5.0)
Cholesterol, Total: 123 mg/dL (ref 100–199)
HDL: 30 mg/dL — ABNORMAL LOW (ref >39)
LDL Chol Calc (NIH): 80 mg/dL (ref 0–99)
Triglycerides: 60 mg/dL (ref 0–149)
VLDL Cholesterol Cal: 13 mg/dL (ref 5–40)

## 2023-07-14 LAB — HEMOGLOBIN A1C
Est. average glucose Bld gHb Est-mCnc: 123 mg/dL
Hgb A1c MFr Bld: 5.9 % — ABNORMAL HIGH (ref 4.8–5.6)

## 2023-07-14 MED ORDER — IPRATROPIUM-ALBUTEROL 0.5-2.5 (3) MG/3ML IN SOLN: 3.0000 mL | Freq: Once | RESPIRATORY_TRACT | Status: DC

## 2023-07-14 MED ORDER — AZITHROMYCIN 250 MG PO TABS: ORAL_TABLET | ORAL | 0 refills | Status: AC

## 2023-07-14 NOTE — Assessment & Plan Note (Addendum)
Cough and congestion for approximately one week with intermittent phlegm production. No fever, chills, or night sweats. Decreased appetite. No difficulty breathing, but tachypnea noted on exam. No current use of antibiotics. -Administered a duoneb via nebulizer in office to help with inflammation and congestion. -Prescribe an antibiotic for the infection. -Schedule a follow-up appointment to monitor progress. - Discussed the possible side effects of medications

## 2023-07-14 NOTE — Assessment & Plan Note (Addendum)
Stable  On Lipitor 20 mg once daily Labs drawn today

## 2023-07-14 NOTE — Assessment & Plan Note (Addendum)
Last A1C 5.9% Stable Manages with diet Labs drawn today

## 2023-07-17 ENCOUNTER — Other Ambulatory Visit: Payer: Self-pay | Admitting: Family Medicine

## 2023-07-17 DIAGNOSIS — K219 Gastro-esophageal reflux disease without esophagitis: Secondary | ICD-10-CM

## 2023-09-07 NOTE — Progress Notes (Signed)
Subjective:   [name] is a 63 y.o. male who presents for Medicare Annual/Subsequent preventive examination.  Visit Complete: In person  Patient Medicare AWV questionnaire was completed in patient and caregiver; I have confirmed that all information answered by patient is correct and no changes since this date.  History of Present Illness The patient, with a history of multiple health issues, is reported to be in good general health by the caregiver. Bowel and urinary functions are normal. The patient has regular social interactions, primarily with his mother who visits every six to eight months. The patient has lifelong speech difficulties, which were unsuccessfully addressed with speech classes in childhood. The patient's physical abilities are limited, but he can follow some instructions and respond to physical stimuli. The patient's hearing and vision are not formally assessed, but the caregiver reports that the patient responds to some auditory stimuli. The patient's medications have not changed, and they are taken as prescribed. The patient's diet is managed to prevent constipation, and the current regimen is effective.  Hyperlipidemia: on atorvastatin 20 mg before bed.   Prediabetes: Eats whatever he is given. He walks a lot.   Hypertension: On valsartan 40 mg daily.   Chronic idiopathic constipation: on miralax 17 gm twice daily, lactulose 45 ml three times a day. Colace 100 mg twice daily as needed. Fleets enema daily as needed. On benefiber once daily as needed.   GERD: on pantoprazole twice daily.   Mentally disabled/Autism: Lives in a group home. Non verbal. Bladder incontinence - wears depends. Rarely has bowel incontinence. On ensure daily. Patient at risk for aspiration. Liquid is thickened.   Depression: On citalopram 40 mg daily, clonazepam 0.5 mg daily, quetiapine 200 mg one before bed. Sees Dr. Lyla Glassing.  Allergic rhinitis: on singulair, loratadine.   Review  of Systems  Constitutional:  Negative for chills, fever and weight loss.  HENT:  Negative for hearing loss.   Respiratory:  Negative for cough and shortness of breath.   Cardiovascular:  Negative for chest pain.  Gastrointestinal:  Negative for abdominal pain, constipation, diarrhea, nausea and vomiting.  Genitourinary:        Incontinence  Psychiatric/Behavioral:  Negative for depression. The patient is not nervous/anxious.     Cardiac Risk Factors include: advanced age (>9men, >32 women);Other (see comment), Risk factor comments: Mentally disabled     Objective:    Today's Vitals   09/08/23 1101  BP: 118/68  Pulse: 88  Temp: (!) 97.2 F (36.2 C)  SpO2: 97%  Weight: 160 lb (72.6 kg)  Height: 6' 1.8" (1.875 m)   Body mass index is 20.65 kg/m.  Physical Exam Vitals reviewed.  HENT:     Right Ear: There is impacted cerumen.     Left Ear: There is impacted cerumen.     Nose: Nose normal. No congestion or rhinorrhea.     Mouth/Throat:     Mouth: Mucous membranes are moist.     Pharynx: No oropharyngeal exudate or posterior oropharyngeal erythema.  Neck:     Vascular: No carotid bruit.  Cardiovascular:     Rate and Rhythm: Normal rate and regular rhythm.     Heart sounds: Normal heart sounds.  Pulmonary:     Effort: Pulmonary effort is normal. No respiratory distress.     Breath sounds: Normal breath sounds. No wheezing, rhonchi or rales.  Abdominal:     General: Bowel sounds are normal.     Palpations: Abdomen is soft.  Tenderness: There is no abdominal tenderness.  Lymphadenopathy:     Cervical: No cervical adenopathy.  Neurological:     Mental Status: He is alert.     Cranial Nerves: No cranial nerve deficit.     Motor: No weakness.     Gait: Gait normal.     Comments: nonverbal  Psychiatric:        Mood and Affect: Mood normal.        Behavior: Behavior normal.        09/08/2023   11:02 AM 08/21/2021   11:28 AM  Advanced Directives  Does Patient  Have a Medical Advance Directive? No No  Would patient like information on creating a medical advance directive? Yes (ED - Information included in AVS)     Current Medications (verified) Outpatient Encounter Medications as of 09/08/2023  Medication Sig   albuterol (PROVENTIL) (2.5 MG/3ML) 0.083% nebulizer solution Take 3 mLs (2.5 mg total) by nebulization every 6 (six) hours as needed for wheezing or shortness of breath.   atorvastatin (LIPITOR) 20 MG tablet TAKE 1 TABLET BY MOUTH ONCE DAILY   citalopram (CELEXA) 40 MG tablet TAKE ONE TABLET BY MOUTH EVERY MORNING   clonazePAM (KLONOPIN) 0.5 MG tablet Take 1 tablet every morning, 1 at noon, 1/2 at 4 pm and 1 at bedtime   docusate sodium (COLACE) 100 MG capsule Take 100 mg by mouth 2 (two) times daily as needed for mild constipation.   feeding supplement, ENSURE COMPLETE, (ENSURE COMPLETE) LIQD Take 237 mLs by mouth daily with breakfast.   lactulose (CHRONULAC) 10 GM/15ML solution TAKE (30GM) BY MOUTH THREE TIMES DAILY   loratadine (CLARITIN) 10 MG tablet Take 10 mg by mouth daily.   montelukast (SINGULAIR) 10 MG tablet TAKE 1 TABLET BY MOUTH ONCE DAILY   Multiple Vitamin (MULTIVITAMIN) tablet Take 1 tablet by mouth daily.   nystatin cream (MYCOSTATIN) Apply 1 application topically 2 (two) times daily. X  2   pantoprazole (PROTONIX) 40 MG tablet TAKE 1 TABLET BY MOUTH TWICE DAILY * DO NOT CRUSH *   polyethylene glycol (MIRALAX) 17 g packet Take 17 gm daily in liquid at noon and 6 pm   QUEtiapine (SEROQUEL) 200 MG tablet Take 200 mg by mouth at bedtime.   sodium phosphate (FLEET) 7-19 GM/118ML ENEM Place 133 mLs (1 enema total) rectally daily as needed for severe constipation. Dose 4.5 OZ Q72 H PRN.   Starch, Thickening, LIQD 3 tsps per 4 oz of liquid. Recommend 12 oz liquid (water,tea, or juice) three times a day.   valsartan (DIOVAN) 40 MG tablet Take 1 tablet (40 mg total) by mouth daily.   Wheat Dextrin (BENEFIBER ON THE GO) POWD TAKE  AS DIRECTED ONCE DAILY   [DISCONTINUED] atorvastatin (LIPITOR) 10 MG tablet Take 1 tablet (10 mg total) by mouth daily.   [DISCONTINUED] valsartan (DIOVAN) 40 MG tablet Take 1 tablet (40 mg total) by mouth daily.   Facility-Administered Encounter Medications as of 09/08/2023  Medication   ipratropium-albuterol (DUONEB) 0.5-2.5 (3) MG/3ML nebulizer solution 3 mL    Allergies (verified) Augmentin [amoxicillin-pot clavulanate]   History: Past Medical History:  Diagnosis Date   Acute bronchitis due to other specified organisms 12/23/2021   Autism    BMI 30.0-30.9,adult 09/26/2019   Chronic idiopathic constipation 06/01/2020   Depression, major, recurrent, mild (HCC) 09/26/2019   Developmental non-verbal disorder    Encounter for Medicare annual wellness exam 09/01/2022   Encounter for removal of sutures 03/28/2022   Essential hypertension  Fall on same level 03/28/2022   Gastroesophageal reflux disease without esophagitis 09/26/2019   Hypotension 11/20/2021   Laceration of occipital region of scalp 03/28/2022   Mixed hyperlipidemia    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   Pedal edema 04/10/2021   Prediabetes    Primary hypertension    Reactive airway disease 12/23/2021   Seizure disorder (HCC)    Severe intellectual disabilities    Thyroid nodule 11/01/2019   Urinary incontinence due to cognitive impairment 04/30/2021   Past Surgical History:  Procedure Laterality Date   LAPAROTOMY  2021   Family History  Family history unknown: Yes   Social History   Socioeconomic History   Marital status: Single    Spouse name: Not on file   Number of children: Not on file   Years of education: Not on file   Highest education level: Not on file  Occupational History   Not on file  Tobacco Use   Smoking status: Never   Smokeless tobacco: Never  Substance and Sexual Activity   Alcohol use: Never   Drug use: Never   Sexual activity: Not on file  Other Topics Concern   Not on file   Social History Narrative   ** Merged History Encounter **       Social Drivers of Health   Financial Resource Strain: Low Risk  (09/08/2023)   Overall Financial Resource Strain (CARDIA)    Difficulty of Paying Living Expenses: Not hard at all  Food Insecurity: No Food Insecurity (09/08/2023)   Hunger Vital Sign    Worried About Running Out of Food in the Last Year: Never true    Ran Out of Food in the Last Year: Never true  Transportation Needs: No Transportation Needs (09/08/2023)   PRAPARE - Administrator, Civil Service (Medical): No    Lack of Transportation (Non-Medical): No  Physical Activity: Inactive (09/08/2023)   Exercise Vital Sign    Days of Exercise per Week: 0 days    Minutes of Exercise per Session: 0 min  Stress: No Stress Concern Present (09/08/2023)   [employer] of Occupational Health - Occupational Stress Questionnaire    Feeling of Stress : Not at all  Social Connections: Socially Isolated (09/08/2023)   Social Connection and Isolation Panel [NHANES]    Frequency of Communication with Friends and Family: Never    Frequency of Social Gatherings with Friends and Family: Never    Attends Religious Services: Never    Database administrator or Organizations: Yes    Attends Banker Meetings: Never    Marital Status: Never married    Tobacco Counseling Counseling given: Not Answered   Clinical Intake:  Pre-visit preparation completed: No  Pain : No/denies pain     Nutritional Status: BMI of 19-24  Normal Nutritional Risks: None Diabetes: No  How often do you need to have someone help you when you read instructions, pamphlets, or other written materials from your doctor or pharmacy?:  (Mentally Disabled)  Interpreter Needed?: No      Activities of Daily Living    09/08/2023   11:04 AM  In your present state of health, do you have any difficulty performing the following activities:  Hearing? 0  Vision? 0  Difficulty  concentrating or making decisions? 1  Walking or climbing stairs? 0  Dressing or bathing? 0  Doing errands, shopping? 1  Preparing Food and eating ? Y  Using the Toilet? Y  In the past six months, have  you accidently leaked urine? Y  Do you have problems with loss of bowel control? Y  Managing your Medications? Y  Managing your Finances? Y  Housekeeping or managing your Housekeeping? Y    Patient Care Team: Blane Ohara, MD as PCP - General (Family Medicine) Park Liter, DPM (Inactive) as Consulting Physician (Podiatry)  Indicate any recent Medical Services you may have received from other than [facility] providers in the past year (date may be approximate).     Assessment:   This is a routine wellness examination for [name].  Hearing/Vision screen No results found.  Depression Screen    09/08/2023   11:04 AM 08/21/2021   11:29 AM 03/26/2020    1:59 PM  PHQ 2/9 Scores  PHQ - 2 Score 0  0  Exception Documentation  Other- indicate reason in comment box   Not completed  patient unable to answer     Fall Risk    09/08/2023   11:04 AM 09/01/2022   11:22 AM 08/21/2021   11:28 AM 03/26/2020    1:58 PM  Fall Risk   Falls in the past year? 0 1 0 0  Number falls in past yr: 0 0 0 0  Injury with Fall? 0 0 0 0  Risk for fall due to : No Fall Risks Impaired mobility No Fall Risks   Follow up Falls evaluation completed Falls evaluation completed;Education provided Falls evaluation completed;Falls prevention discussed     MEDICARE RISK AT HOME: Medicare Risk at Home Any stairs in or around the home?: No Home free of loose throw rugs in walkways, pet beds, electrical cords, etc?: Yes Adequate lighting in your home to reduce risk of falls?: Yes Life alert?: No Use of a cane, walker or w/c?: No Grab bars in the bathroom?: Yes Shower chair or bench in shower?: Yes Elevated toilet seat or a handicapped toilet?: Yes  TIMED UP AND GO:  Was the test performed?  Yes  Length of time  to ambulate 10 feet: 2 sec Gait steady and fast without use of assistive device    Cognitive Function:    09/08/2023   11:06 AM  MMSE - Mini Mental State Exam  Not completed: Unable to complete        Immunizations Immunization History  Administered Date(s) Administered   Dtap, Unspecified 10/04/1962, 11/04/1962, 12/16/1962   Influenza Inj Mdck Quad Pf 04/10/2021, 07/25/2022   Influenza, Mdck, Trivalent,PF 6+ MOS(egg free) 09/08/2023   Influenza,inj,Quad PF,6+ Mos 03/26/2020   Measles 11/10/1968   Moderna Sars-Covid-2 Vaccination 08/18/2019   PPD Test 03/26/2020   Pfizer(Comirnaty)Fall Seasonal Vaccine 12 years and older 09/08/2023   Pneumococcal Polysaccharide-23 05/09/2008   Tdap 05/09/2008, 03/22/2022    Screening Tests Health Maintenance  Topic Date Due   Zoster Vaccines- Shingrix (1 of 2) Never done   Pneumococcal Vaccine 32-62 Years old (2 of 2 - PCV) 05/09/2009   COVID-19 Vaccine (3 - Mixed Product risk series) 10/06/2023   Colonoscopy  03/08/2024   Medicare Annual Wellness (AWV)  09/07/2024   DTaP/Tdap/Td (6 - Td or Tdap) 03/22/2032   INFLUENZA VACCINE  Completed   Hepatitis C Screening  Completed   HPV VACCINES  Aged Out   HIV Screening  Discontinued    Health Maintenance  Health Maintenance Due  Topic Date Due   Zoster Vaccines- Shingrix (1 of 2) Never done   Pneumococcal Vaccine 17-81 Years old (2 of 2 - PCV) 05/09/2009    Additional Screening:  Vision Screening: Recommended  annual ophthalmology exams for early detection of glaucoma and other disorders of the eye. Is the patient up to date with their annual eye exam?   Who is the provider or what is the name of the office in which the patient attends annual eye exams?  If pt is not established with a provider, would they like to be referred to a provider to establish care? No  Dental Screening: Recommended annual dental exams for proper oral hygiene  Community Resource Referral / Chronic Care  Management: CRR required this visit?  No   CCM required this visit?  No     Plan:    Encounter for general adult medical examination with abnormal findings Assessment & Plan: -Administered influenza and COVID-19 vaccines today. -Recommend Shingles vaccine series at pharmacy. -Recommend Prevnar 20 at next visit for ear irrigation. -Overdue for sleep study. Refer for in-lab sleep study in [place]. -Repeat blood count, chemistry panel, thyroid function tests, and Hepatitis C screening.  Orders: -     TSH  Mixed hyperlipidemia Assessment & Plan: Managed with cholesterol medication. -Continue current medication. Continue atorvastatin 20 mg before bed.    Prediabetes Assessment & Plan: Last A1C indicated pre-diabetic status. Recommend continue to work on eating healthy diet and exercise.    Primary hypertension Assessment & Plan: Managed with Valsartan. -Continue Valsartan 40 mg daily.   Orders: -     CBC with Differential/Platelet -     Comprehensive metabolic panel  Chronic idiopathic constipation Assessment & Plan: Managed with daily lactulose, Benefiber, and Miralax. Fleet enemas used as needed. -Continue current regimen.   Bilateral impacted cerumen Assessment & Plan: Bilateral cerumen impaction noted. -Use Debrox for one week in both ears. -Schedule follow-up appointment for ear irrigation.   Encounter for immunization -     Influenza, MDCK, trivalent, PF(Flucelvax egg-free) Best boy Vaccine 56yrs & older  Need for hepatitis C screening test -     HCV Ab w Reflex to Quant PCR  Gastroesophageal reflux disease without esophagitis Assessment & Plan: The current medical regimen is effective;  continue present plan and medications. Continue protonix 40 mg twice daily.    Depression, major, recurrent, mild (HCC) Assessment & Plan: The current medical regimen is effective;  continue present plan and medications. Management per  specialist.  On citalopram 40 mg daily, clonazepam 0.5 mg daily, quetiapine 200 mg one before bed. Sees Dr. Lyla Glassing.    Autism Assessment & Plan: Sees Dr. Lyla Glassing   Severe intellectual disabilities Assessment & Plan: Nonverbal.  Lives in a group home.     I have personally reviewed and noted the following in the patient's chart:   Medical and social history Use of alcohol, tobacco or illicit drugs  Current medications and supplements including opioid prescriptions. Patient is not currently taking opioid prescriptions. Functional ability and status Nutritional status Physical activity Advanced directives List of other physicians Hospitalizations, surgeries, and ER visits in previous 12 months Vitals Screenings to include cognitive, depression, and falls Referrals and appointments  In addition, I have reviewed and discussed with patient certain preventive protocols, quality metrics, and best practice recommendations. A written personalized care plan for preventive services as well as general preventive health recommendations were provided to patient.    Clayborn Bigness I Leal-Borjas,acting as a scribe for Blane Ohara, MD.,have documented all relevant documentation on the behalf of Blane Ohara, MD,as directed by  Blane Ohara, MD while in the presence of Blane Ohara, MD.   I  attest that I have reviewed this visit and agree with the plan scribed by my staff.   Blane Ohara, MD [facility] [phone]

## 2023-09-08 ENCOUNTER — Encounter: Payer: Self-pay | Admitting: Family Medicine

## 2023-09-08 ENCOUNTER — Ambulatory Visit (INDEPENDENT_AMBULATORY_CARE_PROVIDER_SITE_OTHER): Payer: Medicare Other | Admitting: Family Medicine

## 2023-09-08 VITALS — BP 118/68 | HR 88 | Temp 97.2°F | Ht 73.8 in | Wt 160.0 lb

## 2023-09-08 DIAGNOSIS — E782 Mixed hyperlipidemia: Secondary | ICD-10-CM | POA: Diagnosis not present

## 2023-09-08 DIAGNOSIS — K219 Gastro-esophageal reflux disease without esophagitis: Secondary | ICD-10-CM

## 2023-09-08 DIAGNOSIS — Z0001 Encounter for general adult medical examination with abnormal findings: Secondary | ICD-10-CM | POA: Diagnosis not present

## 2023-09-08 DIAGNOSIS — Z1159 Encounter for screening for other viral diseases: Secondary | ICD-10-CM

## 2023-09-08 DIAGNOSIS — H6123 Impacted cerumen, bilateral: Secondary | ICD-10-CM

## 2023-09-08 DIAGNOSIS — F840 Autistic disorder: Secondary | ICD-10-CM

## 2023-09-08 DIAGNOSIS — I10 Essential (primary) hypertension: Secondary | ICD-10-CM | POA: Diagnosis not present

## 2023-09-08 DIAGNOSIS — Z23 Encounter for immunization: Secondary | ICD-10-CM

## 2023-09-08 DIAGNOSIS — F330 Major depressive disorder, recurrent, mild: Secondary | ICD-10-CM

## 2023-09-08 DIAGNOSIS — F72 Severe intellectual disabilities: Secondary | ICD-10-CM

## 2023-09-08 DIAGNOSIS — Z0000 Encounter for general adult medical examination without abnormal findings: Secondary | ICD-10-CM

## 2023-09-08 DIAGNOSIS — K5904 Chronic idiopathic constipation: Secondary | ICD-10-CM

## 2023-09-08 DIAGNOSIS — R7303 Prediabetes: Secondary | ICD-10-CM | POA: Diagnosis not present

## 2023-09-09 ENCOUNTER — Encounter: Payer: Self-pay | Admitting: Family Medicine

## 2023-09-09 LAB — COMPREHENSIVE METABOLIC PANEL
ALT: 15 [IU]/L (ref 0–44)
AST: 18 [IU]/L (ref 0–40)
Albumin: 3.9 g/dL (ref 3.9–4.9)
Alkaline Phosphatase: 97 [IU]/L (ref 44–121)
BUN/Creatinine Ratio: 18 (ref 10–24)
BUN: 14 mg/dL (ref 8–27)
Bilirubin Total: 0.2 mg/dL (ref 0.0–1.2)
CO2: 25 mmol/L (ref 20–29)
Calcium: 9.1 mg/dL (ref 8.6–10.2)
Chloride: 102 mmol/L (ref 96–106)
Creatinine, Ser: 0.76 mg/dL (ref 0.76–1.27)
Globulin, Total: 2.7 g/dL (ref 1.5–4.5)
Glucose: 111 mg/dL — ABNORMAL HIGH (ref 70–99)
Potassium: 4.5 mmol/L (ref 3.5–5.2)
Sodium: 139 mmol/L (ref 134–144)
Total Protein: 6.6 g/dL (ref 6.0–8.5)
eGFR: 102 mL/min/{1.73_m2} (ref >59)

## 2023-09-09 LAB — CBC WITH DIFFERENTIAL/PLATELET
Basophils Absolute: 0 10*3/uL (ref 0.0–0.2)
Basos: 1 %
EOS (ABSOLUTE): 0.4 10*3/uL (ref 0.0–0.4)
Eos: 8 %
Hematocrit: 40.9 % (ref 37.5–51.0)
Hemoglobin: 13.7 g/dL (ref 13.0–17.7)
Immature Grans (Abs): 0 10*3/uL (ref 0.0–0.1)
Immature Granulocytes: 0 %
Lymphocytes Absolute: 1.3 10*3/uL (ref 0.7–3.1)
Lymphs: 25 %
MCH: 29.7 pg (ref 26.6–33.0)
MCHC: 33.5 g/dL (ref 31.5–35.7)
MCV: 89 fL (ref 79–97)
Monocytes Absolute: 0.6 10*3/uL (ref 0.1–0.9)
Monocytes: 13 %
Neutrophils Absolute: 2.7 10*3/uL (ref 1.4–7.0)
Neutrophils: 53 %
Platelets: 265 10*3/uL (ref 150–450)
RBC: 4.62 x10E6/uL (ref 4.14–5.80)
RDW: 12.5 % (ref 11.6–15.4)
WBC: 5 10*3/uL (ref 3.4–10.8)

## 2023-09-09 LAB — TSH: TSH: 1.96 u[IU]/mL (ref 0.450–4.500)

## 2023-09-09 LAB — HCV AB W REFLEX TO QUANT PCR: HCV Ab: NONREACTIVE

## 2023-09-13 DIAGNOSIS — Z1159 Encounter for screening for other viral diseases: Secondary | ICD-10-CM | POA: Insufficient documentation

## 2023-09-13 DIAGNOSIS — Z23 Encounter for immunization: Secondary | ICD-10-CM | POA: Insufficient documentation

## 2023-09-13 DIAGNOSIS — H6123 Impacted cerumen, bilateral: Secondary | ICD-10-CM | POA: Insufficient documentation

## 2023-09-13 NOTE — Assessment & Plan Note (Signed)
The current medical regimen is effective;  continue present plan and medications. Continue protonix 40 mg twice daily.

## 2023-09-13 NOTE — Assessment & Plan Note (Signed)
The current medical regimen is effective;  continue present plan and medications. Management per specialist.  On citalopram 40 mg daily, clonazepam 0.5 mg daily, quetiapine 200 mg one before bed. Sees Dr. Lyla Glassing.

## 2023-09-13 NOTE — Assessment & Plan Note (Addendum)
Nonverbal.  Lives in a group home.

## 2023-09-13 NOTE — Assessment & Plan Note (Signed)
Bilateral cerumen impaction noted. -Use Debrox for one week in both ears. -Schedule follow-up appointment for ear irrigation.

## 2023-09-13 NOTE — Assessment & Plan Note (Addendum)
Managed with cholesterol medication. -Continue current medication. Continue atorvastatin 20 mg before bed.

## 2023-09-13 NOTE — Assessment & Plan Note (Signed)
-  Administered influenza and COVID-19 vaccines today. -Recommend Shingles vaccine series at pharmacy. -Recommend Prevnar 20 at next visit for ear irrigation. -Overdue for sleep study. Refer for in-lab sleep study in [place]. -Repeat blood count, chemistry panel, thyroid function tests, and Hepatitis C screening.

## 2023-09-13 NOTE — Assessment & Plan Note (Signed)
Sees Dr. Clovis Riley

## 2023-09-13 NOTE — Assessment & Plan Note (Addendum)
Last A1C indicated pre-diabetic status. Recommend continue to work on eating healthy diet and exercise.

## 2023-09-13 NOTE — Assessment & Plan Note (Deleted)
Well-controlled.  Continue valsartan 40 mg 1 p.o. daily

## 2023-09-13 NOTE — Assessment & Plan Note (Signed)
Managed with daily lactulose, Benefiber, and Miralax. Fleet enemas used as needed. -Continue current regimen.

## 2023-09-13 NOTE — Assessment & Plan Note (Signed)
Lifelong history of limited speech. No recent changes or concerns. -No changes to current management.

## 2023-09-13 NOTE — Assessment & Plan Note (Addendum)
Managed with Valsartan. -Continue Valsartan 40 mg daily.

## 2023-09-14 ENCOUNTER — Encounter: Payer: Self-pay | Admitting: Podiatry

## 2023-09-14 ENCOUNTER — Ambulatory Visit: Payer: Medicare Other | Admitting: Podiatry

## 2023-09-14 ENCOUNTER — Ambulatory Visit (INDEPENDENT_AMBULATORY_CARE_PROVIDER_SITE_OTHER): Payer: Medicare Other | Admitting: Podiatry

## 2023-09-14 DIAGNOSIS — M79674 Pain in right toe(s): Secondary | ICD-10-CM

## 2023-09-14 DIAGNOSIS — B351 Tinea unguium: Secondary | ICD-10-CM | POA: Diagnosis not present

## 2023-09-14 DIAGNOSIS — M79675 Pain in left toe(s): Secondary | ICD-10-CM | POA: Diagnosis not present

## 2023-09-14 NOTE — Progress Notes (Signed)
  Subjective:  Patient ID: [name], male    DOB: 1961/07/07,  MRN: [id number]  Chief Complaint  Patient presents with   RFC    RFC no callous. Claris Gower is with him today. Not diabetic and no anti coag.     63 y.o. male presents with the above complaint. History confirmed with patient. Patient presenting with pain related to dystrophic thickened elongated nails. Patient is unable to trim own nails related to nail dystrophy and/or mobility issues. Patient does not have a history of T2DM. Patient does have intellectual disability that prevents him from trimming his own nails.  Objective:  Physical Exam: warm, good capillary refill nail exam onychomycosis of the toenails, onycholysis, and dystrophic nails DP pulses palpable, PT pulses palpable, and protective sensation intact Left Foot:  Pain with palpation of nails due to elongation and dystrophic growth.  Right Foot: Pain with palpation of nails due to elongation and dystrophic growth.   Assessment:   1. Pain due to onychomycosis of toenails of both feet       Plan:  Patient was evaluated and treated and all questions answered.   #Onychomycosis with pain  -Nails palliatively debrided as below. -Educated on self-care -As patient is with intellectual disability we will plan to trim his nails for him on a routine basis every 3 months   Procedure: Nail Debridement  Rationale: Pain Type of Debridement: manual, sharp debridement. Instrumentation: Nail nipper, rotary burr. Number of Nails: 10  Return in about 3 months (around 12/12/2023) for Routine Foot Care.         Barbaraann Share, DPM [facility] / [facility]

## 2023-09-17 ENCOUNTER — Ambulatory Visit: Payer: Medicare Other

## 2023-09-18 ENCOUNTER — Ambulatory Visit: Payer: Medicare Other

## 2023-09-18 DIAGNOSIS — H6123 Impacted cerumen, bilateral: Secondary | ICD-10-CM

## 2023-09-18 NOTE — Progress Notes (Signed)
 Patient is in office today for a nurse visit for Ear Lavage. Patient Ear Irrigation was preformed  bilaterally ear.

## 2023-10-14 ENCOUNTER — Telehealth: Payer: Self-pay

## 2023-10-14 NOTE — Telephone Encounter (Signed)
 This patient had exposure to head lice at the day center. [place] with [facility] is requesting treatment for head lice. Phone number: [phone] Please advise.

## 2023-10-19 NOTE — Telephone Encounter (Signed)
 Done

## 2023-12-08 ENCOUNTER — Ambulatory Visit (INDEPENDENT_AMBULATORY_CARE_PROVIDER_SITE_OTHER): Admitting: Podiatry

## 2023-12-08 DIAGNOSIS — M79675 Pain in left toe(s): Secondary | ICD-10-CM

## 2023-12-08 DIAGNOSIS — B351 Tinea unguium: Secondary | ICD-10-CM

## 2023-12-08 DIAGNOSIS — M79674 Pain in right toe(s): Secondary | ICD-10-CM | POA: Diagnosis not present

## 2023-12-08 NOTE — Progress Notes (Unsigned)
  Subjective:  Patient ID: [name], male    DOB: 1960-10-01,  MRN: [id number]  Chief Complaint  Patient presents with   RFC    RFC with out callous. Not diabetic and no anti coag. He is having a good day today.    63 y.o. male presents with the above complaint. History confirmed with patient. Patient presenting with pain related to dystrophic thickened elongated nails. Patient is unable to trim own nails related to nail dystrophy and/or mobility issues. Patient does not have a history of T2DM. Patient does have intellectual disability that prevents him from trimming his own nails.  Objective:  Physical Exam: warm, good capillary refill nail exam onychomycosis of the toenails, onycholysis, and dystrophic nails DP pulses palpable, PT pulses palpable, and protective sensation intact Left Foot:  Pain with palpation of nails due to elongation and dystrophic growth.  Right Foot: Pain with palpation of nails due to elongation and dystrophic growth.   Assessment:   1. Pain due to onychomycosis of toenails of both feet        Plan:  Patient was evaluated and treated and all questions answered.   #Onychomycosis with pain  -Nails palliatively debrided as below. -Educated on self-care -As patient is with intellectual disability we will plan to trim his nails for him on a routine basis every 3 months   Procedure: Nail Debridement  Rationale: Pain Type of Debridement: manual, sharp debridement. Instrumentation: Nail nipper, rotary burr. Number of Nails: 10  Return in about 3 months (around 03/09/2024) for Routine Foot Care.         Reina Cara, DPM [facility] / [facility]

## 2023-12-14 ENCOUNTER — Ambulatory Visit: Payer: Medicare Other | Admitting: Podiatry

## 2024-01-22 ENCOUNTER — Ambulatory Visit (INDEPENDENT_AMBULATORY_CARE_PROVIDER_SITE_OTHER): Admitting: Family Medicine

## 2024-01-22 VITALS — BP 122/72 | HR 85 | Temp 98.0°F | Ht 73.8 in | Wt 161.0 lb

## 2024-01-22 DIAGNOSIS — K219 Gastro-esophageal reflux disease without esophagitis: Secondary | ICD-10-CM

## 2024-01-22 DIAGNOSIS — J208 Acute bronchitis due to other specified organisms: Secondary | ICD-10-CM | POA: Diagnosis not present

## 2024-01-22 DIAGNOSIS — E782 Mixed hyperlipidemia: Secondary | ICD-10-CM

## 2024-01-22 DIAGNOSIS — F330 Major depressive disorder, recurrent, mild: Secondary | ICD-10-CM | POA: Diagnosis not present

## 2024-01-22 DIAGNOSIS — F79 Unspecified intellectual disabilities: Secondary | ICD-10-CM

## 2024-01-22 DIAGNOSIS — I10 Essential (primary) hypertension: Secondary | ICD-10-CM

## 2024-01-22 DIAGNOSIS — R7303 Prediabetes: Secondary | ICD-10-CM

## 2024-01-22 DIAGNOSIS — K5904 Chronic idiopathic constipation: Secondary | ICD-10-CM

## 2024-01-22 LAB — CBC WITH DIFFERENTIAL/PLATELET
Basophils Absolute: 0 10*3/uL (ref 0.0–0.2)
Basos: 1 %
EOS (ABSOLUTE): 0.4 10*3/uL (ref 0.0–0.4)
Eos: 7 %
Hematocrit: 42.7 % (ref 37.5–51.0)
Hemoglobin: 14 g/dL (ref 13.0–17.7)
Immature Grans (Abs): 0 10*3/uL (ref 0.0–0.1)
Immature Granulocytes: 0 %
Lymphocytes Absolute: 1.2 10*3/uL (ref 0.7–3.1)
Lymphs: 21 %
MCH: 29.5 pg (ref 26.6–33.0)
MCHC: 32.8 g/dL (ref 31.5–35.7)
MCV: 90 fL (ref 79–97)
Monocytes Absolute: 0.6 10*3/uL (ref 0.1–0.9)
Monocytes: 10 %
Neutrophils Absolute: 3.5 10*3/uL (ref 1.4–7.0)
Neutrophils: 61 %
Platelets: 281 10*3/uL (ref 150–450)
RBC: 4.75 x10E6/uL (ref 4.14–5.80)
RDW: 13.2 % (ref 11.6–15.4)
WBC: 5.7 10*3/uL (ref 3.4–10.8)

## 2024-01-22 LAB — COMPREHENSIVE METABOLIC PANEL WITH GFR
ALT: 17 IU/L (ref 0–44)
AST: 18 IU/L (ref 0–40)
Albumin: 3.8 g/dL — ABNORMAL LOW (ref 3.9–4.9)
Alkaline Phosphatase: 111 IU/L (ref 44–121)
BUN/Creatinine Ratio: 21 (ref 10–24)
BUN: 17 mg/dL (ref 8–27)
Bilirubin Total: 0.3 mg/dL (ref 0.0–1.2)
CO2: 18 mmol/L — ABNORMAL LOW (ref 20–29)
Calcium: 9 mg/dL (ref 8.6–10.2)
Chloride: 105 mmol/L (ref 96–106)
Creatinine, Ser: 0.81 mg/dL (ref 0.76–1.27)
Globulin, Total: 2.5 g/dL (ref 1.5–4.5)
Glucose: 129 mg/dL — ABNORMAL HIGH (ref 70–99)
Potassium: 4.3 mmol/L (ref 3.5–5.2)
Sodium: 140 mmol/L (ref 134–144)
Total Protein: 6.3 g/dL (ref 6.0–8.5)
eGFR: 100 mL/min/{1.73_m2} (ref >59)

## 2024-01-22 MED ORDER — CEFDINIR 300 MG PO CAPS: 300.0000 mg | ORAL_CAPSULE | Freq: Two times a day (BID) | ORAL | 0 refills | Status: DC

## 2024-01-22 MED ORDER — STARCH (THICKENING) PO LIQD: ORAL | 11 refills | Status: DC

## 2024-01-22 MED ORDER — ENSURE COMPLETE PO LIQD: 237.0000 mL | Freq: Every day | ORAL | 11 refills | Status: AC

## 2024-01-22 MED ORDER — PANTOPRAZOLE SODIUM 40 MG PO TBEC
40.0000 mg | DELAYED_RELEASE_TABLET | Freq: Two times a day (BID) | ORAL | 2 refills | Status: DC
Start: 2024-01-22 — End: 2024-05-02

## 2024-01-22 MED ORDER — AZITHROMYCIN 250 MG PO TABS: ORAL_TABLET | ORAL | 0 refills | Status: DC

## 2024-01-22 MED ORDER — MONTELUKAST SODIUM 10 MG PO TABS: 10.0000 mg | ORAL_TABLET | Freq: Every day | ORAL | 11 refills | Status: AC

## 2024-01-22 MED ORDER — NYSTATIN 100000 UNIT/GM EX CREA: 1.0000 | TOPICAL_CREAM | Freq: Two times a day (BID) | CUTANEOUS | 3 refills | Status: DC

## 2024-01-22 MED ORDER — LACTULOSE 10 GM/15ML PO SOLN
ORAL | 3 refills | Status: DC
Start: 2024-01-22 — End: 2024-02-07

## 2024-01-22 MED ORDER — ONE-DAILY MULTI VITAMINS PO TABS: 1.0000 | ORAL_TABLET | Freq: Every day | ORAL | 0 refills | Status: AC

## 2024-01-22 MED ORDER — ATORVASTATIN CALCIUM 20 MG PO TABS: 20.0000 mg | ORAL_TABLET | Freq: Every day | ORAL | 3 refills | Status: DC

## 2024-01-22 MED ORDER — CITALOPRAM HYDROBROMIDE 40 MG PO TABS: 40.0000 mg | ORAL_TABLET | Freq: Every morning | ORAL | 11 refills | Status: AC

## 2024-01-22 MED ORDER — VALSARTAN 40 MG PO TABS: 40.0000 mg | ORAL_TABLET | Freq: Every day | ORAL | 5 refills | Status: DC

## 2024-01-22 MED ORDER — ALBUTEROL SULFATE (2.5 MG/3ML) 0.083% IN NEBU: 2.5000 mg | INHALATION_SOLUTION | Freq: Four times a day (QID) | RESPIRATORY_TRACT | 2 refills | Status: AC | PRN

## 2024-01-22 MED ORDER — BENEFIBER ON THE GO PO POWD: ORAL | 11 refills | Status: DC

## 2024-01-22 MED ORDER — CLONAZEPAM 0.5 MG PO TABS: ORAL_TABLET | ORAL | 3 refills | Status: DC

## 2024-01-22 NOTE — Progress Notes (Unsigned)
 Subjective:  Patient ID: [name], male    DOB: [birth year]  Age: 63 y.o. MRN: [id number]  Chief Complaint  Patient presents with   Medical Management of Chronic Issues    HPI: Discussed the use of AI scribe software for clinical note transcription with the patient, who gave verbal consent to proceed.  History of Present Illness   [name] is a 63 year old male who presents with a progressive cough. He is accompanied by a caregiver.  Cough and respiratory symptoms - Progressive cough without associated dyspnea or fever - No choking on food - No difficulty breathing  Activity level and functional status - Decreased activity level compared to baseline - Typically ambulates more when not ill - Attends a state program where he is mostly seated with occasional community outings - Current inactivity is unusual for him  Sleep patterns - Sleeps through the night - Occasionally wakes to use the bathroom independently - Returns to bed on his own after bathroom visits  Appetite and oral intake - Good appetite - Consumes all food provided - No issues with eating  Bowel and bladder function - Regular bowel movements, with two occurring this morning - Wears a mix of protective garments but generally uses the bathroom - Occasional urinary incontinence  Current symptomatic treatments - Currently taking Robitussin and Sudafed at home      Group Home is closing, [facility]- Will be his new [facility] Provider and present today.  Hyperlipidemia: Management includes Lipitor 20 mg every day. He reports excellent compliance with treatment. He is not having side effects.    GERD: Current treatment consist of: Pantoprazole  40 mg every day, He reports excellent compliance with treatment. He is not having side effects. .   Hypertension: Management includes Valsartan  40 mg. He reports excellent compliance with treatment. He is not having side effects. He is following a Regular  diet.  Prediabetes: Diet controlled.     09/08/2023   11:04 AM 03/26/2020    1:59 PM  Depression screen PHQ 2/9  Decreased Interest 0 0  Down, Depressed, Hopeless 0 0  PHQ - 2 Score 0 0        09/08/2023   11:04 AM  Fall Risk   Falls in the past year? 0  Number falls in past yr: 0  Injury with Fall? 0  Risk for fall due to : No Fall Risks  Follow up Falls evaluation completed    Patient Care Team: Sherre Clapper, MD as PCP - General (Family Medicine) Gretel Ozell PARAS, DPM (Inactive) as Consulting Physician (Podiatry)   Review of Systems  Constitutional:  Negative for chills, diaphoresis, fatigue and fever.  HENT:  Negative for congestion, ear pain and sore throat.   Respiratory:  Positive for cough. Negative for shortness of breath.   Cardiovascular:  Negative for chest pain and leg swelling.  Gastrointestinal:  Negative for abdominal pain, constipation, diarrhea, nausea and vomiting.  Genitourinary:  Negative for dysuria and urgency.  Musculoskeletal:  Negative for arthralgias and myalgias.  Neurological:  Negative for dizziness and headaches.  Psychiatric/Behavioral:  Negative for dysphoric mood.     Current Outpatient Medications on File Prior to Visit  Medication Sig Dispense Refill   docusate sodium (COLACE) 100 MG capsule Take 100 mg by mouth 2 (two) times daily as needed for mild constipation.     loratadine (CLARITIN) 10 MG tablet Take 10 mg by mouth daily.     polyethylene glycol (MIRALAX) 17  g packet Take 17 gm daily in liquid at noon and 6 pm     QUEtiapine (SEROQUEL) 200 MG tablet Take 200 mg by mouth at bedtime.     sodium phosphate (FLEET) 7-19 GM/118ML ENEM Place 133 mLs (1 enema total) rectally daily as needed for severe constipation. Dose 4.5 OZ Q72 H PRN. 133 mL 6   No current facility-administered medications on file prior to visit.   Past Medical History:  Diagnosis Date   Acute bronchitis due to other specified organisms 12/23/2021   Autism    BMI  30.0-30.9,adult 09/26/2019   Chronic idiopathic constipation 06/01/2020   Depression, major, recurrent, mild (HCC) 09/26/2019   Developmental non-verbal disorder    Encounter for Medicare annual wellness exam 09/01/2022   Encounter for removal of sutures 03/28/2022   Essential hypertension    Fall on same level 03/28/2022   Gastroesophageal reflux disease without esophagitis 09/26/2019   Hypotension 11/20/2021   Laceration of occipital region of scalp 03/28/2022   Mixed hyperlipidemia    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   Pedal edema 04/10/2021   Prediabetes    Primary hypertension    Reactive airway disease 12/23/2021   Seizure disorder (HCC)    Severe intellectual disabilities    Thyroid  nodule 11/01/2019   Urinary incontinence due to cognitive impairment 04/30/2021   Past Surgical History:  Procedure Laterality Date   LAPAROTOMY  2021    Family History  Family history unknown: Yes   Social History   Socioeconomic History   Marital status: Single    Spouse name: Not on file   Number of children: Not on file   Years of education: Not on file   Highest education level: Not on file  Occupational History   Not on file  Tobacco Use   Smoking status: Never   Smokeless tobacco: Never  Substance and Sexual Activity   Alcohol use: Never   Drug use: Never   Sexual activity: Not on file  Other Topics Concern   Not on file  Social History Narrative   ** Merged History Encounter **       Social Drivers of Health   Financial Resource Strain: Low Risk  (09/08/2023)   Overall Financial Resource Strain (CARDIA)    Difficulty of Paying Living Expenses: Not hard at all  Food Insecurity: No Food Insecurity (09/08/2023)   Hunger Vital Sign    Worried About Running Out of Food in the Last Year: Never true    Ran Out of Food in the Last Year: Never true  Transportation Needs: No Transportation Needs (09/08/2023)   PRAPARE - Administrator, Civil Service (Medical): No     Lack of Transportation (Non-Medical): No  Physical Activity: Inactive (09/08/2023)   Exercise Vital Sign    Days of Exercise per Week: 0 days    Minutes of Exercise per Session: 0 min  Stress: No Stress Concern Present (09/08/2023)   [employer] of Occupational Health - Occupational Stress Questionnaire    Feeling of Stress : Not at all  Social Connections: Socially Isolated (09/08/2023)   Social Connection and Isolation Panel    Frequency of Communication with Friends and Family: Never    Frequency of Social Gatherings with Friends and Family: Never    Attends Religious Services: Never    Database administrator or Organizations: Yes    Attends Banker Meetings: Never    Marital Status: Never married    Objective:  BP 122/72  Pulse 85   Temp 98 F (36.7 C)   Ht 6' 1.8 (1.875 m)   Wt 161 lb (73 kg)   SpO2 97%   BMI 20.78 kg/m      01/22/2024    9:05 AM 09/08/2023   11:01 AM 07/14/2023    8:57 AM  BP/Weight  Systolic BP 122 118 110  Diastolic BP 72 68 62  Wt. (Lbs) 161 160 163  BMI 20.78 kg/m2 20.65 kg/m2 22.11 kg/m2    Physical Exam Constitutional:      Appearance: Normal appearance.  HENT:     Right Ear: Tympanic membrane, ear canal and external ear normal.     Left Ear: Tympanic membrane, ear canal and external ear normal.     Nose: Nose normal. No congestion or rhinorrhea.     Mouth/Throat:     Mouth: Mucous membranes are moist.     Pharynx: No oropharyngeal exudate or posterior oropharyngeal erythema.  Neck:     Vascular: No carotid bruit.   Cardiovascular:     Rate and Rhythm: Normal rate and regular rhythm.     Heart sounds: Normal heart sounds.  Pulmonary:     Effort: Pulmonary effort is normal. No respiratory distress.     Breath sounds: Rhonchi (LLL) present. No wheezing or rales.  Abdominal:     General: Bowel sounds are normal.     Palpations: Abdomen is soft.     Tenderness: There is no abdominal tenderness.  Lymphadenopathy:      Cervical: No cervical adenopathy.   Neurological:     Mental Status: He is alert.   Psychiatric:        Mood and Affect: Mood normal.        Behavior: Behavior normal.         Lab Results  Component Value Date   WBC 5.7 01/22/2024   HGB 14.0 01/22/2024   HCT 42.7 01/22/2024   PLT 281 01/22/2024   GLUCOSE 129 (H) 01/22/2024   CHOL 123 07/14/2023   TRIG 60 07/14/2023   HDL 30 (L) 07/14/2023   LDLCALC 80 07/14/2023   ALT 17 01/22/2024   AST 18 01/22/2024   NA 140 01/22/2024   K 4.3 01/22/2024   CL 105 01/22/2024   CREATININE 0.81 01/22/2024   BUN 17 01/22/2024   CO2 18 (L) 01/22/2024   TSH 1.960 09/08/2023   INR 1.1 12/01/2008   HGBA1C 5.9 (H) 07/14/2023      Assessment & Plan:  Acute bronchitis due to other specified organisms Assessment & Plan: Start on omnicef 300 mg twice daily x 10 day.  OTC robitussin and sudafed.  Sent albuterol . If not improving by Monday, Please call and I will order a stat cxray.   Orders: -     Albuterol  Sulfate; Take 3 mLs (2.5 mg total) by nebulization every 6 (six) hours as needed for wheezing or shortness of breath.  Dispense: 150 mL; Refill: 2 -     CBC with Differential/Platelet -     Comprehensive metabolic panel with GFR -     Cefdinir; Take 1 capsule (300 mg total) by mouth 2 (two) times daily.  Dispense: 20 capsule; Refill: 0  Depression, major, recurrent, mild (HCC) Assessment & Plan: The current medical regimen is effective;  continue present plan and medications. Management per specialist.  On citalopram 40 mg daily, clonazepam  0.5 mg daily, quetiapine 200 mg one before bed.  Sees Dr. Cosette.   Orders: -  Citalopram Hydrobromide; Take 1 tablet (40 mg total) by mouth every morning.  Dispense: 30 tablet; Refill: 11 -     clonazePAM ; Take 1 tablet every morning, 1 at noon, 1/2 at 4 pm and 1 at bedtime  Dispense: 105 tablet; Refill: 3  Mentally disabled Assessment & Plan: Nonverbal, but follows  instructions.  Moving to an individuals home. Group home is closing   Orders: -     Ensure Complete; Take 237 mLs by mouth daily with breakfast.  Dispense: 7110 mL; Refill: 11 -     Montelukast  Sodium; Take 1 tablet (10 mg total) by mouth daily.  Dispense: 30 tablet; Refill: 11 -     One-Daily Multi Vitamins; Take 1 tablet by mouth daily.  Dispense: 90 tablet; Refill: 0  Gastroesophageal reflux disease without esophagitis Assessment & Plan: The current medical regimen is effective;  continue present plan and medications. Continue protonix  40 mg twice daily.   Orders: -     Pantoprazole  Sodium; Take 1 tablet (40 mg total) by mouth 2 (two) times daily.  Dispense: 60 tablet; Refill: 2 -     Starch (Thickening); 3 tsps per 4 oz of liquid. Recommend 12 oz liquid (water,tea, or juice) three times a day.  Dispense: 237 mL; Refill: 11  Mixed hyperlipidemia Assessment & Plan: Managed with cholesterol medication. -Continue current medication. Continue atorvastatin  20 mg before bed.   Orders: -     Atorvastatin  Calcium ; Take 1 tablet (20 mg total) by mouth daily.  Dispense: 30 tablet; Refill: 3  Prediabetes Assessment & Plan: Last A1C indicated pre-diabetic status. Recommend continue to work on eating healthy diet and exercise.    Primary hypertension Assessment & Plan: Managed with Valsartan . -Continue Valsartan  40 mg daily.   Orders: -     Valsartan ; Take 1 tablet (40 mg total) by mouth daily.  Dispense: 30 tablet; Refill: 5  Chronic idiopathic constipation Assessment & Plan: Managed with daily lactulose , Benefiber. -Continue current regimen.  Orders: -     Lactulose ; TAKE 45ML (30GM) BY MOUTH THREE TIMES DAILY  Dispense: 946 mL; Refill: 3 -     Nystatin ; Apply 1 Application topically 2 (two) times daily. X  2  Dispense: 30 g; Refill: 3 -     Benefiber On The GO; One package twice daily  Dispense: 28 each; Refill: 11     Meds ordered this encounter  Medications   albuterol   (PROVENTIL ) (2.5 MG/3ML) 0.083% nebulizer solution    Sig: Take 3 mLs (2.5 mg total) by nebulization every 6 (six) hours as needed for wheezing or shortness of breath.    Dispense:  150 mL    Refill:  2   atorvastatin  (LIPITOR) 20 MG tablet    Sig: Take 1 tablet (20 mg total) by mouth daily.    Dispense:  30 tablet    Refill:  3   citalopram (CELEXA) 40 MG tablet    Sig: Take 1 tablet (40 mg total) by mouth every morning.    Dispense:  30 tablet    Refill:  11   clonazePAM  (KLONOPIN ) 0.5 MG tablet    Sig: Take 1 tablet every morning, 1 at noon, 1/2 at 4 pm and 1 at bedtime    Dispense:  105 tablet    Refill:  3   feeding supplement, ENSURE COMPLETE, (ENSURE COMPLETE) LIQD    Sig: Take 237 mLs by mouth daily with breakfast.    Dispense:  7110 mL    Refill:  11  lactulose  (CHRONULAC ) 10 GM/15ML solution    Sig: TAKE 45ML (30GM) BY MOUTH THREE TIMES DAILY    Dispense:  946 mL    Refill:  3   montelukast  (SINGULAIR ) 10 MG tablet    Sig: Take 1 tablet (10 mg total) by mouth daily.    Dispense:  30 tablet    Refill:  11   Multiple Vitamin (MULTIVITAMIN) tablet    Sig: Take 1 tablet by mouth daily.    Dispense:  90 tablet    Refill:  0   nystatin  cream (MYCOSTATIN )    Sig: Apply 1 Application topically 2 (two) times daily. X  2    Dispense:  30 g    Refill:  3   pantoprazole  (PROTONIX ) 40 MG tablet    Sig: Take 1 tablet (40 mg total) by mouth 2 (two) times daily.    Dispense:  60 tablet    Refill:  2   Starch, Thickening, LIQD    Sig: 3 tsps per 4 oz of liquid. Recommend 12 oz liquid (water,tea, or juice) three times a day.    Dispense:  237 mL    Refill:  11   valsartan  (DIOVAN ) 40 MG tablet    Sig: Take 1 tablet (40 mg total) by mouth daily.    Dispense:  30 tablet    Refill:  5   Wheat Dextrin (BENEFIBER ON THE GO) POWD    Sig: One package twice daily    Dispense:  28 each    Refill:  11   DISCONTD: azithromycin  (ZITHROMAX ) 250 MG tablet    Sig: Take 2 tablets on day  1, then 1 tablet daily on days 2 through 5    Dispense:  6 tablet    Refill:  0   DISCONTD: cefdinir (OMNICEF) 300 MG capsule    Sig: Take 1 capsule (300 mg total) by mouth 2 (two) times daily.    Dispense:  20 capsule    Refill:  0   cefdinir (OMNICEF) 300 MG capsule    Sig: Take 1 capsule (300 mg total) by mouth 2 (two) times daily.    Dispense:  20 capsule    Refill:  0    Orders Placed This Encounter  Procedures   CBC with Differential/Platelet   Comprehensive metabolic panel with GFR     Follow-up: Return in about 3 months (around 04/26/2024) for chronic follow up.   I,Katherina A Bramblett,acting as a scribe for Abigail Free, MD.,have documented all relevant documentation on the behalf of Abigail Free, MD,as directed by  Abigail Free, MD while in the presence of Abigail Free, MD.   LILLETTE Kato I Leal-Borjas,acting as a scribe for Abigail Free, MD.,have documented all relevant documentation on the behalf of Abigail Free, MD,as directed by  Abigail Free, MD while in the presence of Abigail Free, MD.    An After Visit Summary was printed and given to the patient.  I attest that I have reviewed this visit and agree with the plan scribed by my staff.   Abigail Free, MD [facility] [phone]

## 2024-01-24 ENCOUNTER — Ambulatory Visit: Payer: Self-pay | Admitting: Family Medicine

## 2024-01-24 ENCOUNTER — Encounter: Payer: Self-pay | Admitting: Family Medicine

## 2024-01-24 NOTE — Assessment & Plan Note (Signed)
 Managed with Valsartan. -Continue Valsartan 40 mg daily.

## 2024-01-24 NOTE — Assessment & Plan Note (Signed)
 The current medical regimen is effective;  continue present plan and medications. Management per specialist.  On citalopram 40 mg daily, clonazepam 0.5 mg daily, quetiapine 200 mg one before bed. Sees Dr. Lyla Glassing.

## 2024-01-24 NOTE — Assessment & Plan Note (Signed)
 Managed with cholesterol medication. -Continue current medication. Continue atorvastatin 20 mg before bed.

## 2024-01-24 NOTE — Assessment & Plan Note (Signed)
 Managed with daily lactulose , Benefiber. -Continue current regimen.

## 2024-01-24 NOTE — Assessment & Plan Note (Addendum)
 Nonverbal, but follows instructions.  Moving to an individuals home. Group home is closing

## 2024-01-24 NOTE — Assessment & Plan Note (Signed)
 Last A1C indicated pre-diabetic status. Recommend continue to work on eating healthy diet and exercise.

## 2024-01-24 NOTE — Assessment & Plan Note (Signed)
The current medical regimen is effective;  continue present plan and medications. Continue protonix 40 mg twice daily.

## 2024-01-24 NOTE — Assessment & Plan Note (Addendum)
 Start on omnicef 300 mg twice daily x 10 day.  OTC robitussin and sudafed.  Sent albuterol . If not improving by Monday, Please call and I will order a stat cxray.

## 2024-01-27 LAB — SPECIMEN STATUS REPORT

## 2024-01-27 LAB — HGB A1C W/O EAG: Hgb A1c MFr Bld: 5.7 % — ABNORMAL HIGH (ref 4.8–5.6)

## 2024-02-05 ENCOUNTER — Telehealth: Payer: Self-pay

## 2024-02-05 NOTE — Telephone Encounter (Signed)
 Alan, Pharmacologist at [facility] called and she says the patient will be moving to [facility] and they will need to get the medications aligned from what the group home has and what is at the pharmacy:   Valsartan -correct dosage/instructions verified, Rx sent on 01/22/24  Benifiber-usually once a day at group home; advised per Rx to give BID.  Nystatin -pharmacy question is it going to be PRN, if so will need a Rx changing from BID to BID PRN or however provider chooses.   Lactulose -pharmacy states the 946 ml will only last 7 days, so they will need a new Rx with larger quantity  Clonazepam  (verified the dosage/instructions)  Will need Rx for loratadine , Docusate sodium , and miralax   Need Rx for seroquel-pharmacy stated the group home says on their list to give 300 mg (1-1/2 tab) TID, but in the chart it's 200 mg at bedtime   Will need Rx to check BP daily, Rx for Weight check the 6th of every month   Copied from CRM [id number]. Topic: Clinical - Medication Question >> Feb 05, 2024  9:11 AM Willma SAUNDERS wrote: Reason for CRM: Alan from [place] would like to speak to a nurse directly to discuss the patients medication.  Alan can be reached at [phone]

## 2024-02-07 ENCOUNTER — Other Ambulatory Visit: Payer: Self-pay | Admitting: Family Medicine

## 2024-02-07 DIAGNOSIS — F79 Unspecified intellectual disabilities: Secondary | ICD-10-CM

## 2024-02-07 DIAGNOSIS — F330 Major depressive disorder, recurrent, mild: Secondary | ICD-10-CM

## 2024-02-07 DIAGNOSIS — K5904 Chronic idiopathic constipation: Secondary | ICD-10-CM

## 2024-02-07 MED ORDER — LACTULOSE 10 GM/15ML PO SOLN: ORAL | 11 refills | Status: DC

## 2024-02-07 MED ORDER — BENEFIBER ON THE GO PO POWD: ORAL | 11 refills | Status: DC

## 2024-02-07 MED ORDER — LORATADINE 10 MG PO TABS: 10.0000 mg | ORAL_TABLET | Freq: Every day | ORAL | 11 refills | Status: AC

## 2024-02-07 MED ORDER — POLYETHYLENE GLYCOL 3350 17 G PO PACK: 17.0000 g | PACK | Freq: Two times a day (BID) | ORAL | 11 refills | Status: AC

## 2024-02-07 MED ORDER — DOCUSATE SODIUM 100 MG PO CAPS: 100.0000 mg | ORAL_CAPSULE | Freq: Two times a day (BID) | ORAL | 11 refills | Status: AC | PRN

## 2024-02-07 MED ORDER — NYSTATIN 100000 UNIT/GM EX CREA: 1.0000 | TOPICAL_CREAM | Freq: Two times a day (BID) | CUTANEOUS | 3 refills | Status: DC

## 2024-02-12 ENCOUNTER — Other Ambulatory Visit: Payer: Self-pay | Admitting: Family Medicine

## 2024-02-12 DIAGNOSIS — F330 Major depressive disorder, recurrent, mild: Secondary | ICD-10-CM

## 2024-02-12 MED ORDER — QUETIAPINE FUMARATE 200 MG PO TABS: 200.0000 mg | ORAL_TABLET | Freq: Three times a day (TID) | ORAL | 2 refills | Status: DC

## 2024-02-12 NOTE — Telephone Encounter (Signed)
 Orders needed for AL     Medication: QUEtiapine (SEROQUEL) 200 MG tablet AND docusate sodium  (COLACE) 100 MG capsule AND sodium phosphate (FLEET) 7-19 GM/118ML ENEM AND Active Yogurt AND Blood Pressure check daily AND Weight check 6th day every month

## 2024-02-12 NOTE — Telephone Encounter (Signed)
 Copied from CRM [id number]. Topic: Clinical - Medication Refill >> Feb 12, 2024  8:52 AM Montie POUR wrote: Medication: QUEtiapine (SEROQUEL) 200 MG tablet AND docusate sodium  (COLACE) 100 MG capsule AND sodium phosphate (FLEET) 7-19 GM/118ML ENEM AND Active Yogurt AND Blood Pressure check daily AND Weight check 6th day every month   Has the patient contacted their pharmacy? Yes (Agent: If no, request that the patient contact the pharmacy for the refill. If patient does not wish to contact the pharmacy document the reason why and proceed with request.) (Agent: If yes, when and what did the pharmacy advise?) Needs orders to fill and complete   This is the patient's preferred pharmacy:  [facility] - [place], [place] - [address] [place] [address] Phone: [phone] Fax: [phone]  Is this the correct pharmacy for this prescription? Yes If no, delete pharmacy and type the correct one.   Has the prescription been filled recently? No  Is the patient out of the medication? No  Has the patient been seen for an appointment in the last year OR does the patient have an upcoming appointment? Yes  Can we respond through MyChart? No  Agent: Please be advised that Rx refills may take up to 3 business days. We ask that you follow-up with your pharmacy.

## 2024-03-07 ENCOUNTER — Ambulatory Visit: Payer: Medicare Other | Admitting: Family Medicine

## 2024-03-16 ENCOUNTER — Ambulatory Visit (INDEPENDENT_AMBULATORY_CARE_PROVIDER_SITE_OTHER): Admitting: Family Medicine

## 2024-03-16 VITALS — BP 112/60 | HR 88 | Temp 98.1°F | Ht 73.0 in | Wt 164.0 lb

## 2024-03-16 DIAGNOSIS — Z79899 Other long term (current) drug therapy: Secondary | ICD-10-CM

## 2024-03-16 DIAGNOSIS — Z4802 Encounter for removal of sutures: Secondary | ICD-10-CM | POA: Diagnosis not present

## 2024-03-16 DIAGNOSIS — E782 Mixed hyperlipidemia: Secondary | ICD-10-CM | POA: Diagnosis not present

## 2024-03-16 DIAGNOSIS — R42 Dizziness and giddiness: Secondary | ICD-10-CM

## 2024-03-16 NOTE — Progress Notes (Unsigned)
 Subjective:  Patient ID: [name], male    DOB: 12-30-1960  Age: 63 y.o. MRN: [id number]  Chief Complaint  Patient presents with   Hospitalization Follow-up    HPI:  Patient presents today for a hospital follow up for a fall. Discussed the use of AI scribe software for clinical note transcription with the patient, who gave verbal consent to proceed.  History of Present Illness   [name] is a 63 year old male who presents with episodes of pallor and wooziness in the afternoons. He is accompanied by his caregiver.  Episodes of pallor and wooziness - Episodes of pallor and wooziness occur in the afternoons, around the same time each day - Symptoms include noticeable changes in color, appearing 'wobbly' or 'spaced out' - Episodes have been present since last Friday - No chest pain, shortness of breath, or palpitations during episodes - Caregiver monitors blood pressure during episodes; pulse remains stable  Recent fall and disorientation - Sustained a fall at vocational rehab program, striking head on toilet - Required staples to the hand after the fall - Experienced disorientation for one to two minutes following the fall - Currently recovering from the incident  Appetite and nutritional intake - Maintains good appetite - Diet includes Healthy Choice meals, Lean Cuisine, sugar-free pudding, NutriGrain bars, fruit cups, and sugar-free drinks - Lunch at noon, dinner at five o'clock, with a snack provided after program - Medications administered with applesauce and juice  Respiratory symptoms - Receives nightly breathing treatments for congestion and wheezing when lying down - No current wheezing  Gastrointestinal symptoms - No diarrhea, black, or bloody bowel movements      Caregiver also states that she needs a written order stating that she can administer patient noon medications at a different time due to him being at the day program at noon and  they do not have a nurse on site available to administer these medications.      09/08/2023   11:04 AM 03/26/2020    1:59 PM  Depression screen PHQ 2/9  Decreased Interest 0 0  Down, Depressed, Hopeless 0 0  PHQ - 2 Score 0 0        09/08/2023   11:04 AM  Fall Risk   Falls in the past year? 0  Number falls in past yr: 0  Injury with Fall? 0  Risk for fall due to : No Fall Risks  Follow up Falls evaluation completed    Patient Care Team: Sherre Clapper, MD as PCP - General (Family Medicine) Gretel Ozell PARAS, DPM (Inactive) as Consulting Physician (Podiatry)   Review of Systems  Constitutional:  Negative for chills, diaphoresis, fatigue and fever.  HENT:  Negative for congestion, ear pain and sore throat.   Respiratory:  Negative for cough and shortness of breath.   Cardiovascular:  Negative for chest pain and leg swelling.  Gastrointestinal:  Negative for abdominal pain, constipation, diarrhea, nausea and vomiting.  Genitourinary:  Negative for dysuria and urgency.  Musculoskeletal:  Negative for arthralgias and myalgias.  Neurological:  Negative for dizziness and headaches.  Psychiatric/Behavioral:  Negative for dysphoric mood.     Current Outpatient Medications on File Prior to Visit  Medication Sig Dispense Refill   clonazePAM  (KLONOPIN ) 0.5 MG tablet Take 0.5 mg by mouth 3 (three) times daily. 1/2 qam, 1/2 q4 pm, 1/2 qhs     QUEtiapine  (SEROQUEL ) 200 MG tablet Take 200 mg by mouth 3 (three) times daily. 1.5 tab  qam, 1.5 tab qpm, 1 tab qhs     albuterol  (PROVENTIL ) (2.5 MG/3ML) 0.083% nebulizer solution Take 3 mLs (2.5 mg total) by nebulization every 6 (six) hours as needed for wheezing or shortness of breath. 150 mL 2   atorvastatin  (LIPITOR) 20 MG tablet Take 1 tablet (20 mg total) by mouth daily. 30 tablet 3   citalopram  (CELEXA ) 40 MG tablet Take 1 tablet (40 mg total) by mouth every morning. 30 tablet 11   docusate sodium  (COLACE) 100 MG capsule Take 1 capsule (100 mg  total) by mouth 2 (two) times daily as needed for mild constipation. 60 capsule 11   feeding supplement, ENSURE COMPLETE, (ENSURE COMPLETE) LIQD Take 237 mLs by mouth daily with breakfast. 7110 mL 11   lactulose  (CHRONULAC ) 10 GM/15ML solution TAKE 45ML (30GM) BY MOUTH THREE TIMES DAILY 3784 mL 11   loratadine  (CLARITIN ) 10 MG tablet Take 1 tablet (10 mg total) by mouth daily. 30 tablet 11   montelukast  (SINGULAIR ) 10 MG tablet Take 1 tablet (10 mg total) by mouth daily. 30 tablet 11   Multiple Vitamin (MULTIVITAMIN) tablet Take 1 tablet by mouth daily. 90 tablet 0   nystatin  cream (MYCOSTATIN ) Apply 1 Application topically 2 (two) times daily. 30 g 3   pantoprazole  (PROTONIX ) 40 MG tablet Take 1 tablet (40 mg total) by mouth 2 (two) times daily. 60 tablet 2   polyethylene glycol (MIRALAX ) 17 g packet Take 17 g by mouth 2 (two) times daily. 60 each 11   sodium phosphate (FLEET) 7-19 GM/118ML ENEM Place 133 mLs (1 enema total) rectally daily as needed for severe constipation. Dose 4.5 OZ Q72 H PRN. 133 mL 6   Starch, Thickening, LIQD 3 tsps per 4 oz of liquid. Recommend 12 oz liquid (water,tea, or juice) three times a day. 237 mL 11   valsartan  (DIOVAN ) 40 MG tablet Take 1 tablet (40 mg total) by mouth daily. 30 tablet 5   Wheat Dextrin (BENEFIBER ON THE GO) POWD One package daily 28 each 11   No current facility-administered medications on file prior to visit.   Past Medical History:  Diagnosis Date   Acute bronchitis due to other specified organisms 12/23/2021   Autism    BMI 30.0-30.9,adult 09/26/2019   Chronic idiopathic constipation 06/01/2020   Depression, major, recurrent, mild (HCC) 09/26/2019   Developmental non-verbal disorder    Encounter for Medicare annual wellness exam 09/01/2022   Encounter for removal of sutures 03/28/2022   Essential hypertension    Fall on same level 03/28/2022   Gastroesophageal reflux disease without esophagitis 09/26/2019   Hypotension 11/20/2021    Laceration of occipital region of scalp 03/28/2022   Mixed hyperlipidemia    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   Pedal edema 04/10/2021   Prediabetes    Primary hypertension    Reactive airway disease 12/23/2021   Seizure disorder (HCC)    Severe intellectual disabilities    Thyroid  nodule 11/01/2019   Urinary incontinence due to cognitive impairment 04/30/2021   Past Surgical History:  Procedure Laterality Date   LAPAROTOMY  2021    Family History  Family history unknown: Yes   Social History   Socioeconomic History   Marital status: Single    Spouse name: Not on file   Number of children: Not on file   Years of education: Not on file   Highest education level: Not on file  Occupational History   Not on file  Tobacco Use   Smoking status: Never   Smokeless  tobacco: Never  Substance and Sexual Activity   Alcohol use: Never   Drug use: Never   Sexual activity: Not on file  Other Topics Concern   Not on file  Social History Narrative   ** Merged History Encounter **       Social Drivers of Health   Financial Resource Strain: Low Risk  (09/08/2023)   Overall Financial Resource Strain (CARDIA)    Difficulty of Paying Living Expenses: Not hard at all  Food Insecurity: No Food Insecurity (09/08/2023)   Hunger Vital Sign    Worried About Running Out of Food in the Last Year: Never true    Ran Out of Food in the Last Year: Never true  Transportation Needs: No Transportation Needs (09/08/2023)   PRAPARE - Administrator, Civil Service (Medical): No    Lack of Transportation (Non-Medical): No  Physical Activity: Inactive (09/08/2023)   Exercise Vital Sign    Days of Exercise per Week: 0 days    Minutes of Exercise per Session: 0 min  Stress: No Stress Concern Present (09/08/2023)   [employer] of Occupational Health - Occupational Stress Questionnaire    Feeling of Stress : Not at all  Social Connections: Socially Isolated (09/08/2023)   Social Connection and  Isolation Panel    Frequency of Communication with Friends and Family: Never    Frequency of Social Gatherings with Friends and Family: Never    Attends Religious Services: Never    Database administrator or Organizations: Yes    Attends Banker Meetings: Never    Marital Status: Never married    Objective:  BP 112/60   Pulse 88   Temp 98.1 F (36.7 C)   Ht 6' 1 (1.854 m)   Wt 164 lb (74.4 kg)   SpO2 98%   BMI 21.64 kg/m      03/16/2024    3:28 PM 01/22/2024    9:05 AM 09/08/2023   11:01 AM  BP/Weight  Systolic BP 112 122 118  Diastolic BP 60 72 68  Wt. (Lbs) 164 161 160  BMI 21.64 kg/m2 20.78 kg/m2 20.65 kg/m2    Physical Exam Vitals reviewed.  Constitutional:      Comments: Baseline appearance  Neck:     Vascular: No carotid bruit.  Cardiovascular:     Rate and Rhythm: Normal rate and regular rhythm.     Heart sounds: Normal heart sounds.  Pulmonary:     Effort: Pulmonary effort is normal.     Breath sounds: Normal breath sounds. No wheezing, rhonchi or rales.  Abdominal:     General: Bowel sounds are normal.     Palpations: Abdomen is soft.     Tenderness: There is no abdominal tenderness.  Skin:    Comments: 2 staples posterior right scalp. C/D/I. Removed without issue.   Neurological:     Mental Status: He is alert.  Psychiatric:        Mood and Affect: Mood normal.        Behavior: Behavior normal.         Lab Results  Component Value Date   WBC 5.7 01/22/2024   HGB 14.0 01/22/2024   HCT 42.7 01/22/2024   PLT 281 01/22/2024   GLUCOSE 129 (H) 01/22/2024   CHOL 123 07/14/2023   TRIG 60 07/14/2023   HDL 30 (L) 07/14/2023   LDLCALC 80 07/14/2023   ALT 17 01/22/2024   AST 18 01/22/2024   NA 140 01/22/2024  K 4.3 01/22/2024   CL 105 01/22/2024   CREATININE 0.81 01/22/2024   BUN 17 01/22/2024   CO2 18 (L) 01/22/2024   TSH 1.960 09/08/2023   INR 1.1 12/01/2008   HGBA1C 5.7 (H) 01/22/2024      Assessment & Plan:   Dizziness Assessment & Plan: Check labs Unable to draw blood today. Patient to return for labs towards the end of the month.    Orders: -     CBC with Differential/Platelet -     Comprehensive metabolic panel with GFR  Drug therapy Assessment & Plan: Check labs- needs lipid panel.  It is too early for his A1c. It was 5.7 on 01/22/24.   Orders: -     Lipid panel  Encounter for staple removal Assessment & Plan: Remove 2 staples.    Mixed hyperlipidemia Assessment & Plan: Check lipid.  The current medical regimen is effective;  continue present plan and medications.       No orders of the defined types were placed in this encounter.   Orders Placed This Encounter  Procedures   CBC with Differential/Platelet   Comprehensive metabolic panel with GFR   Lipid panel     Follow-up: Return in about 6 weeks (around 04/27/2024).  I,Marla I Leal-Borjas,acting as a scribe for Abigail Free, MD.,have documented all relevant documentation on the behalf of Abigail Free, MD,as directed by  Abigail Free, MD while in the presence of Abigail Free, MD.    An After Visit Summary was printed and given to the patient.  Abigail Free, MD [facility] [phone]

## 2024-03-16 NOTE — Patient Instructions (Signed)
  CHANGE QUETIAPINE  200 MG 1.5 IN AM, 1.5 AT 4 PM, AND 1 AT NIGHT. NO DOSE AT NOON.   CHANGE MIRALAX  17 GM TO IN AM AND PM.   CHANGE THICK-IT TO MORNING, 4 PM AND PM DOSING WITH APPROXIMATELY 12 OZ.   CHECK BP IF BECOMES DIZZY.

## 2024-03-19 DIAGNOSIS — R42 Dizziness and giddiness: Secondary | ICD-10-CM | POA: Insufficient documentation

## 2024-03-19 DIAGNOSIS — Z79899 Other long term (current) drug therapy: Secondary | ICD-10-CM | POA: Insufficient documentation

## 2024-03-19 NOTE — Assessment & Plan Note (Signed)
 Check labs

## 2024-03-22 ENCOUNTER — Encounter: Payer: Self-pay | Admitting: Family Medicine

## 2024-03-22 DIAGNOSIS — Z4802 Encounter for removal of sutures: Secondary | ICD-10-CM | POA: Insufficient documentation

## 2024-03-22 NOTE — Assessment & Plan Note (Signed)
 Remove 2 staples.

## 2024-03-22 NOTE — Assessment & Plan Note (Signed)
 Check lipid.  The current medical regimen is effective;  continue present plan and medications.

## 2024-04-06 ENCOUNTER — Encounter: Payer: Self-pay | Admitting: Family Medicine

## 2024-04-06 ENCOUNTER — Ambulatory Visit (INDEPENDENT_AMBULATORY_CARE_PROVIDER_SITE_OTHER): Admitting: Family Medicine

## 2024-04-06 VITALS — BP 108/72 | HR 95 | Temp 98.0°F | Ht 73.0 in | Wt 164.0 lb

## 2024-04-06 DIAGNOSIS — Z9189 Other specified personal risk factors, not elsewhere classified: Secondary | ICD-10-CM

## 2024-04-06 DIAGNOSIS — Z79899 Other long term (current) drug therapy: Secondary | ICD-10-CM | POA: Diagnosis not present

## 2024-04-06 DIAGNOSIS — J189 Pneumonia, unspecified organism: Secondary | ICD-10-CM | POA: Diagnosis not present

## 2024-04-06 NOTE — Patient Instructions (Signed)
  VISIT SUMMARY: You came in for a follow-up visit after being treated for pneumonia. Your symptoms have improved significantly after completing a 10-day course of antibiotics.  YOUR PLAN: BILATERAL PNEUMONIA: You had pneumonia in both lower lobes of your lungs, but your symptoms have improved after taking antibiotics. -Complete the course of Levaquin. -No additional treatments are needed. -No need for a repeat chest x-ray.  ASPIRATION RISK: You have a risk of aspiration due to swallowing difficulties, which increases your risk of pneumonia. -Continue using thickened liquids to prevent aspiration.  FOLLOW-UP: You need to follow up for blood work and vaccinations after your emergency department visit. -Schedule a follow-up appointment after September 27 for A1c and lipid panel tests. -Get a flu shot during your follow-up visit. -Consider getting a pneumonia vaccination during your follow-up visit.                      Contains text generated by [employer]bridge.                                 Contains text generated by [employer]bridge.

## 2024-04-06 NOTE — Progress Notes (Signed)
 Subjective:  Patient ID: [name], male    DOB: 1960/09/12  Age: 63 y.o. MRN: [id number]  Chief Complaint  Patient presents with   Hospitalization Follow-up   HPI: Discussed the use of AI scribe software for clinical note transcription with the patient, who gave verbal consent to proceed.  History of Present Illness [name] is a 63 year old male who presents for pneumonia follow-up. Patient was seen in the ED.   Respiratory symptoms - Persistent cough present, with improvement since starting antibiotics - Chills and shivering occurred a few days prior to the visit, now resolved - No current fever or chills - Breathing treatments administered at night, with decreased frequency since antibiotic initiation  Antibiotic therapy - Completed 10-day course of Levaquin 750 mg once daily, with today as the last dose - Improvement in respiratory symptoms during antibiotic therapy  Aspiration precautions and nutrition - Requires thickened liquids to prevent aspiration - Eating well overall, with one day of reduced appetite that resolved the following day  Radiographic findings - Chest x-ray demonstrated pneumonia in the lower lobe, later confirmed as bilateral pneumonia       09/08/2023   11:04 AM 03/26/2020    1:59 PM  Depression screen PHQ 2/9  Decreased Interest 0 0  Down, Depressed, Hopeless 0 0  PHQ - 2 Score 0 0        09/08/2023   11:04 AM  Fall Risk   Falls in the past year? 0  Number falls in past yr: 0  Injury with Fall? 0  Risk for fall due to : No Fall Risks  Follow up Falls evaluation completed    Patient Care Team: Sherre Clapper, MD as PCP - General (Family Medicine) Gretel Ozell PARAS, DPM (Inactive) as Consulting Physician (Podiatry)   Review of Systems  Constitutional:  Negative for chills and fever.  HENT:  Negative for congestion, ear pain and sore throat.   Respiratory:  Negative for cough and shortness of breath.    Cardiovascular:  Negative for chest pain.    Current Outpatient Medications on File Prior to Visit  Medication Sig Dispense Refill   levofloxacin (LEVAQUIN) 750 MG tablet Take 750 mg by mouth daily.     albuterol  (PROVENTIL ) (2.5 MG/3ML) 0.083% nebulizer solution Take 3 mLs (2.5 mg total) by nebulization every 6 (six) hours as needed for wheezing or shortness of breath. 150 mL 2   atorvastatin  (LIPITOR) 20 MG tablet Take 1 tablet (20 mg total) by mouth daily. 30 tablet 3   citalopram  (CELEXA ) 40 MG tablet Take 1 tablet (40 mg total) by mouth every morning. 30 tablet 11   clonazePAM  (KLONOPIN ) 0.5 MG tablet Take 0.5 mg by mouth 3 (three) times daily. 1/2 qam, 1/2 q4 pm, 1/2 qhs     docusate sodium  (COLACE) 100 MG capsule Take 1 capsule (100 mg total) by mouth 2 (two) times daily as needed for mild constipation. 60 capsule 11   feeding supplement, ENSURE COMPLETE, (ENSURE COMPLETE) LIQD Take 237 mLs by mouth daily with breakfast. 7110 mL 11   lactulose  (CHRONULAC ) 10 GM/15ML solution TAKE 45ML (30GM) BY MOUTH THREE TIMES DAILY 3784 mL 11   loratadine  (CLARITIN ) 10 MG tablet Take 1 tablet (10 mg total) by mouth daily. 30 tablet 11   montelukast  (SINGULAIR ) 10 MG tablet Take 1 tablet (10 mg total) by mouth daily. 30 tablet 11   Multiple Vitamin (MULTIVITAMIN) tablet Take 1 tablet by mouth daily. 90 tablet  0   nystatin  cream (MYCOSTATIN ) Apply 1 Application topically 2 (two) times daily. 30 g 3   pantoprazole  (PROTONIX ) 40 MG tablet Take 1 tablet (40 mg total) by mouth 2 (two) times daily. 60 tablet 2   polyethylene glycol (MIRALAX ) 17 g packet Take 17 g by mouth 2 (two) times daily. 60 each 11   QUEtiapine  (SEROQUEL ) 200 MG tablet Take 200 mg by mouth 3 (three) times daily. 1.5 tab qam, 1.5 tab qpm, 1 tab qhs     sodium phosphate (FLEET) 7-19 GM/118ML ENEM Place 133 mLs (1 enema total) rectally daily as needed for severe constipation. Dose 4.5 OZ Q72 H PRN. 133 mL 6   Starch, Thickening, LIQD 3  tsps per 4 oz of liquid. Recommend 12 oz liquid (water,tea, or juice) three times a day. 237 mL 11   valsartan  (DIOVAN ) 40 MG tablet Take 1 tablet (40 mg total) by mouth daily. 30 tablet 5   Wheat Dextrin (BENEFIBER ON THE GO) POWD One package daily 28 each 11   No current facility-administered medications on file prior to visit.   Past Medical History:  Diagnosis Date   Acute bronchitis due to other specified organisms 12/23/2021   Autism    BMI 30.0-30.9,adult 09/26/2019   Chronic idiopathic constipation 06/01/2020   Depression, major, recurrent, mild (HCC) 09/26/2019   Developmental non-verbal disorder    Encounter for Medicare annual wellness exam 09/01/2022   Encounter for removal of sutures 03/28/2022   Essential hypertension    Fall on same level 03/28/2022   Gastroesophageal reflux disease without esophagitis 09/26/2019   Hypotension 11/20/2021   Laceration of occipital region of scalp 03/28/2022   Mixed hyperlipidemia    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   Pedal edema 04/10/2021   Prediabetes    Primary hypertension    Reactive airway disease 12/23/2021   Seizure disorder (HCC)    Severe intellectual disabilities    Thyroid  nodule 11/01/2019   Urinary incontinence due to cognitive impairment 04/30/2021   Past Surgical History:  Procedure Laterality Date   LAPAROTOMY  2021    Family History  Family history unknown: Yes   Social History   Socioeconomic History   Marital status: Single    Spouse name: Not on file   Number of children: Not on file   Years of education: Not on file   Highest education level: Not on file  Occupational History   Not on file  Tobacco Use   Smoking status: Never   Smokeless tobacco: Never  Substance and Sexual Activity   Alcohol use: Never   Drug use: Never   Sexual activity: Not on file  Other Topics Concern   Not on file  Social History Narrative   ** Merged History Encounter **       Social Drivers of Health   Financial  Resource Strain: Low Risk  (09/08/2023)   Overall Financial Resource Strain (CARDIA)    Difficulty of Paying Living Expenses: Not hard at all  Food Insecurity: No Food Insecurity (09/08/2023)   Hunger Vital Sign    Worried About Running Out of Food in the Last Year: Never true    Ran Out of Food in the Last Year: Never true  Transportation Needs: No Transportation Needs (09/08/2023)   PRAPARE - Administrator, Civil Service (Medical): No    Lack of Transportation (Non-Medical): No  Physical Activity: Inactive (09/08/2023)   Exercise Vital Sign    Days of Exercise per Week: 0 days  Minutes of Exercise per Session: 0 min  Stress: No Stress Concern Present (09/08/2023)   [employer] of Occupational Health - Occupational Stress Questionnaire    Feeling of Stress : Not at all  Social Connections: Socially Isolated (09/08/2023)   Social Connection and Isolation Panel    Frequency of Communication with Friends and Family: Never    Frequency of Social Gatherings with Friends and Family: Never    Attends Religious Services: Never    Database administrator or Organizations: Yes    Attends Banker Meetings: Never    Marital Status: Never married    Objective:  BP 108/72   Pulse 95   Temp 98 F (36.7 C)   Ht 6' 1 (1.854 m)   Wt 164 lb (74.4 kg)   SpO2 95%   BMI 21.64 kg/m      04/06/2024   11:00 AM 03/16/2024    3:28 PM 01/22/2024    9:05 AM  BP/Weight  Systolic BP 108 112 122  Diastolic BP 72 60 72  Wt. (Lbs) 164 164 161  BMI 21.64 kg/m2 21.64 kg/m2 20.78 kg/m2    Physical Exam Vitals reviewed.  Constitutional:      Comments: Appearance baseline  Cardiovascular:     Rate and Rhythm: Normal rate and regular rhythm.     Heart sounds: Normal heart sounds.  Pulmonary:     Effort: Pulmonary effort is normal.     Breath sounds: Normal breath sounds. No wheezing, rhonchi or rales.  Neurological:     Mental Status: He is alert.         Lab  Results  Component Value Date   WBC 5.7 01/22/2024   HGB 14.0 01/22/2024   HCT 42.7 01/22/2024   PLT 281 01/22/2024   GLUCOSE 129 (H) 01/22/2024   CHOL 123 07/14/2023   TRIG 60 07/14/2023   HDL 30 (L) 07/14/2023   LDLCALC 80 07/14/2023   ALT 17 01/22/2024   AST 18 01/22/2024   NA 140 01/22/2024   K 4.3 01/22/2024   CL 105 01/22/2024   CREATININE 0.81 01/22/2024   BUN 17 01/22/2024   CO2 18 (L) 01/22/2024   TSH 1.960 09/08/2023   INR 1.1 12/01/2008   HGBA1C 5.7 (H) 01/22/2024      Assessment & Plan:  Pneumonia of both lungs due to infectious organism, unspecified part of lung Assessment & Plan: - Complete Levaquin course. - No additional treatments required. - No need for repeat chest x-ray.   At high risk for aspiration Assessment & Plan: Aspiration risk due to swallowing difficulty requiring thickened liquids, increasing pneumonia risk.   Drug therapy Assessment & Plan: Patient to return on 04/26/2024 for vaccines and POCT A1C AND LIPID FOR PSYCHIATRY.   Orders: -     POCT glycosylated hemoglobin (Hb A1C); Future -     POCT Lipid Panel; Future     No orders of the defined types were placed in this encounter.   Orders Placed This Encounter  Procedures   POCT glycosylated hemoglobin (Hb A1C)   POCT Lipid Panel     Follow-up: No follow-ups on file. I,Marla I Leal-Borjas,acting as a scribe for Abigail Free, MD.,have documented all relevant documentation on the behalf of Abigail Free, MD,as directed by  Abigail Free, MD while in the presence of Abigail Free, MD.    An After Visit Summary was printed and given to the patient.  I attest that I have reviewed this visit and agree  with the plan scribed by my staff.  Abigail Free, MD [facility] [phone]

## 2024-04-13 DIAGNOSIS — J189 Pneumonia, unspecified organism: Secondary | ICD-10-CM | POA: Insufficient documentation

## 2024-04-13 DIAGNOSIS — Z9189 Other specified personal risk factors, not elsewhere classified: Secondary | ICD-10-CM | POA: Insufficient documentation

## 2024-04-13 NOTE — Assessment & Plan Note (Signed)
 Aspiration risk due to swallowing difficulty requiring thickened liquids, increasing pneumonia risk.

## 2024-04-13 NOTE — Assessment & Plan Note (Addendum)
-   Complete Levaquin course. - No additional treatments required. - No need for repeat chest x-ray.

## 2024-04-16 NOTE — Assessment & Plan Note (Signed)
 Patient to return on 04/26/2024 for vaccines and POCT A1C AND LIPID FOR PSYCHIATRY.

## 2024-04-18 ENCOUNTER — Telehealth: Payer: Self-pay | Admitting: Family Medicine

## 2024-04-18 NOTE — Telephone Encounter (Signed)
 US  [facility] Medical Equipment Order Form

## 2024-04-26 ENCOUNTER — Ambulatory Visit

## 2024-04-26 ENCOUNTER — Ambulatory Visit: Payer: Self-pay | Admitting: Family Medicine

## 2024-04-26 ENCOUNTER — Ambulatory Visit: Admitting: Family Medicine

## 2024-04-26 DIAGNOSIS — Z79899 Other long term (current) drug therapy: Secondary | ICD-10-CM

## 2024-04-26 DIAGNOSIS — Z23 Encounter for immunization: Secondary | ICD-10-CM

## 2024-04-26 LAB — POCT LIPID PANEL
HDL: 30
LDL: 99
Non-HDL: 117
TC/HDL: 3.3
TC: 147
TRG: 90

## 2024-04-26 LAB — POCT GLYCOSYLATED HEMOGLOBIN (HGB A1C): Hemoglobin A1C: 5.4 % (ref 4.0–5.6)

## 2024-04-26 NOTE — Progress Notes (Signed)
 Patient is in office today for a nurse visit for Immunization. Patient is receiving two vaccines today Prevnar 20 and Flu. Flu Injection was given in the  Left deltoid. Patient tolerated injection well. Prevnar 20 injection was given in the right deltoid.  POC A1C and POC Lipid was done as well.

## 2024-05-02 ENCOUNTER — Other Ambulatory Visit: Payer: Self-pay | Admitting: Family Medicine

## 2024-05-02 DIAGNOSIS — K219 Gastro-esophageal reflux disease without esophagitis: Secondary | ICD-10-CM

## 2024-05-06 LAB — BASIC METABOLIC PANEL WITH GFR
BUN: 15 (ref 4–21)
CO2: 28 — AB (ref 13–22)
Chloride: 104 (ref 99–108)
Creatinine: 0.7 (ref 0.6–1.3)
Glucose: 117
Potassium: 4.4 meq/L (ref 3.5–5.1)
Sodium: 140 (ref 137–147)

## 2024-05-06 LAB — HEPATIC FUNCTION PANEL
ALT: 32 U/L (ref 10–40)
AST: 36 (ref 14–40)
Alkaline Phosphatase: 99 (ref 25–125)
Bilirubin, Total: 0.7

## 2024-05-06 LAB — COMPREHENSIVE METABOLIC PANEL WITH GFR
Albumin: 3.8 (ref 3.5–5.0)
Calcium: 8.7 (ref 8.7–10.7)
eGFR: 105

## 2024-05-06 LAB — CBC AND DIFFERENTIAL
HCT: 41 (ref 41–53)
Hemoglobin: 13.5 (ref 13.5–17.5)
Neutrophils Absolute: 10.15
Platelets: 245 K/uL (ref 150–400)
WBC: 11.8

## 2024-05-06 LAB — CBC: RBC: 4.47 (ref 3.87–5.11)

## 2024-05-07 ENCOUNTER — Other Ambulatory Visit: Payer: Self-pay | Admitting: Family Medicine

## 2024-05-07 DIAGNOSIS — K5904 Chronic idiopathic constipation: Secondary | ICD-10-CM

## 2024-05-09 ENCOUNTER — Ambulatory Visit: Payer: Self-pay

## 2024-05-09 ENCOUNTER — Ambulatory Visit: Admitting: Family Medicine

## 2024-05-09 ENCOUNTER — Telehealth: Payer: Self-pay | Admitting: Family Medicine

## 2024-05-09 NOTE — Telephone Encounter (Signed)
 Note:  Pt was seen in office 04/06/24 for pneumonia f/u. Was tx by ED with Levaquin x 10 days. ----------------------------------------- Copied from CRM [id number]. Topic: Clinical - Medication Refill >> May 09, 2024 10:38 AM Rosaria E wrote: Medication: Pt was prescribed an antibiotic leaving the ED.   Azithromycin  500 MG 1 tablet by mouth daily.   Has the patient contacted their pharmacy? Yes (Agent: If no, request that the patient contact the pharmacy for the refill. If patient does not wish to contact the pharmacy document the reason why and proceed with request.) (Agent: If yes, when and what did the pharmacy advise?)  This is the patient's preferred pharmacy:  [facility] - [place], [place] - [address] [place] [address] Phone: [phone] Fax: [phone]  Is this the correct pharmacy for this prescription? Yes If no, delete pharmacy and type the correct one.   Has the prescription been filled recently? Yes  Is the patient out of the medication? Yes  Has the patient been seen for an appointment in the last year OR does the patient have an upcoming appointment? Yes  Can we respond through MyChart? Yes  Agent: Please be advised that Rx refills may take up to 3 business days. We ask that you follow-up with your pharmacy. >> May 09, 2024 10:48 AM Berwyn MATSU wrote: Please send prescrption to :  [id number] [place], [place] - [address] AT [facility] [address] Phone: [phone]  Fax: [phone]

## 2024-05-09 NOTE — Telephone Encounter (Signed)
 FYI Only or Action Required?: FYI only for provider.  Patient was last seen in primary care on 04/06/2024 by Sherre Clapper, MD.  Called Nurse Triage reporting Fever.  Symptoms began several days ago.  Interventions attempted: Prescription medications: Levaquin.  Symptoms are: gradually worsening.  Triage Disposition: See Physician Within 24 Hours  Patient/caregiver understands and will follow disposition?: Yes  Reason for Disposition  Fever > 100.4 F (38.0 C)  Answer Assessment - Initial Assessment Questions No available appt with pcp, scheduled with alt provider, 05/09/24. 1. SYMPTOM: What's the main symptom you're concerned about? (e.g., breathing difficulty, fever, weakness)     Pt's caregiver reports Albuterol  prn, wheezing, sob at times, taking predisone,  patient is still ambulating, eating, drinking, urinating;  Went to ED 05/06/24, second time getting pneumonia 03/2024 and now. Levaquin; only has 2 pills left; medication not effective Last temp 100.7 this morning at 0800. 2. ONSET: When did the    start?     05/04/24 3. BETTER-SAME-WORSE: Are you getting better, staying the same, or getting worse compared to the day you were discharged?     Not getting any better 4. HOSPITALIZATION: How long were you hospitalized? (e.g., days)     no 5. DISCHARGE DATE: What date were you discharged from the hospital?     no  8. DISCHARGE MEDICINES: Did the doctor (or NP/PA) who discharged you order any new medicines for you to use? If yes, have you filled the prescription and started taking the medicine?      Levaquin 9. DISCHARGE ANTIBIOTICS: Are you taking any antibiotic medicine now?      Levauin 10. BREATHING DIFFICULTY: Are you having any difficulty breathing? If Yes, ask: How bad is it?  (e.g., none, mild, moderate, severe)         11. FEVER: Do you have a fever? If Yes, ask: What is it, how was it measured and when did it start?        12. OTHER SYMPTOMS:  Do you have any other symptoms? (e.g., weakness, confusion, pain)    denies  Protocols used: Pneumonia on Antibiotic Post-Hospitalization Follow-up Call-[facility]

## 2024-05-09 NOTE — Telephone Encounter (Signed)
 Copied from CRM [id number]. Topic: Clinical - Medication Refill >> May 09, 2024 10:38 AM Rosaria E wrote: Medication: Pt was prescribed an antibiotic leaving the ED.   Azithromycin  500 MG 1 tablet by mouth daily.   Has the patient contacted their pharmacy? Yes (Agent: If no, request that the patient contact the pharmacy for the refill. If patient does not wish to contact the pharmacy document the reason why and proceed with request.) (Agent: If yes, when and what did the pharmacy advise?)  This is the patient's preferred pharmacy:  [facility] - [place], [place] - [address] [place] [address] Phone: [phone] Fax: [phone]  Is this the correct pharmacy for this prescription? Yes If no, delete pharmacy and type the correct one.   Has the prescription been filled recently? Yes  Is the patient out of the medication? Yes  Has the patient been seen for an appointment in the last year OR does the patient have an upcoming appointment? Yes  Can we respond through MyChart? Yes  Agent: Please be advised that Rx refills may take up to 3 business days. We ask that you follow-up with your pharmacy.

## 2024-05-10 ENCOUNTER — Ambulatory Visit (INDEPENDENT_AMBULATORY_CARE_PROVIDER_SITE_OTHER): Admitting: Family Medicine

## 2024-05-10 ENCOUNTER — Encounter: Payer: Self-pay | Admitting: Family Medicine

## 2024-05-10 VITALS — BP 126/74 | HR 94 | Temp 98.0°F | Resp 18 | Ht 73.0 in | Wt 166.8 lb

## 2024-05-10 DIAGNOSIS — S01112D Laceration without foreign body of left eyelid and periocular area, subsequent encounter: Secondary | ICD-10-CM | POA: Diagnosis not present

## 2024-05-10 DIAGNOSIS — J189 Pneumonia, unspecified organism: Secondary | ICD-10-CM | POA: Diagnosis not present

## 2024-05-10 LAB — CBC WITH DIFFERENTIAL/PLATELET
Basophils Absolute: 0 x10E3/uL (ref 0.0–0.2)
Basos: 0 %
EOS (ABSOLUTE): 0 x10E3/uL (ref 0.0–0.4)
Eos: 0 %
Hematocrit: 42.9 % (ref 37.5–51.0)
Hemoglobin: 14.1 g/dL (ref 13.0–17.7)
Immature Grans (Abs): 0.1 x10E3/uL (ref 0.0–0.1)
Immature Granulocytes: 1 %
Lymphocytes Absolute: 1 x10E3/uL (ref 0.7–3.1)
Lymphs: 8 %
MCH: 29.9 pg (ref 26.6–33.0)
MCHC: 32.9 g/dL (ref 31.5–35.7)
MCV: 91 fL (ref 79–97)
Monocytes Absolute: 0.9 x10E3/uL (ref 0.1–0.9)
Monocytes: 8 %
Neutrophils Absolute: 10.2 x10E3/uL — ABNORMAL HIGH (ref 1.4–7.0)
Neutrophils: 83 %
Platelets: 331 x10E3/uL (ref 150–450)
RBC: 4.71 x10E6/uL (ref 4.14–5.80)
RDW: 13 % (ref 11.6–15.4)
WBC: 12.2 x10E3/uL — ABNORMAL HIGH (ref 3.4–10.8)

## 2024-05-10 MED ORDER — LEVOFLOXACIN 750 MG PO TABS: 750.0000 mg | ORAL_TABLET | Freq: Every day | ORAL | 0 refills | Status: AC

## 2024-05-10 NOTE — Progress Notes (Signed)
 Acute Office Visit  Subjective:    Patient ID: [name], male    DOB: 1960/12/13, 63 y.o.   MRN: [id number]  Chief Complaint  Patient presents with   Hospitalization Follow-up    ED FOLLOW UP    Discussed the use of AI scribe software for clinical note transcription with the patient, who gave verbal consent to proceed.  History of Present Illness   [name] is a 63 year old male who presents for a hospital follow-up for recurrent pneumonia.  Recurrent pneumonia - Recurrent episodes, including most recent on 05/06/24 and prior episode in September - Currently on azithromycin  with one pill remaining; previous treatment in September with Levaquin for ten days - chest xray confirmed - right lung current pneumonia; left lung involvement in September - Recent chest x-ray performed on October 10th - White blood cell count was 11.8 on October 10th - Uses albuterol  as needed, approximately every six hours - On day four of a five-day course of prednisone 5 mg daily - dysphagia contributing factor for recurrent pneumonia   Injury due to fall - Patient was seen yesterday at [facility]  - He fell bending over to pick something up and sustained a 3 cm linear laceration to the left eyebrow. - Dermabond was used to close the wound.  - patient is non-verbal, but caregiver reports no LOC and the hospital did an evaluation including a head and neck CT, which were both unremarkable     Past Medical History:  Diagnosis Date   Acute bronchitis due to other specified organisms 12/23/2021   Autism    BMI 30.0-30.9,adult 09/26/2019   Chronic idiopathic constipation 06/01/2020   Depression, major, recurrent, mild 09/26/2019   Developmental non-verbal disorder    Encounter for Medicare annual wellness exam 09/01/2022   Encounter for removal of sutures 03/28/2022   Essential hypertension    Fall on same level 03/28/2022   Gastroesophageal reflux disease without  esophagitis 09/26/2019   Hypotension 11/20/2021   Laceration of occipital region of scalp 03/28/2022   Mixed hyperlipidemia    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   Pedal edema 04/10/2021   Prediabetes    Primary hypertension    Reactive airway disease 12/23/2021   Seizure disorder (HCC)    Severe intellectual disabilities    Thyroid  nodule 11/01/2019   Urinary incontinence due to cognitive impairment 04/30/2021    Past Surgical History:  Procedure Laterality Date   LAPAROTOMY  2021    Family History  Family history unknown: Yes    Social History   Socioeconomic History   Marital status: Single    Spouse name: Not on file   Number of children: Not on file   Years of education: Not on file   Highest education level: Not on file  Occupational History   Not on file  Tobacco Use   Smoking status: Never   Smokeless tobacco: Never  Substance and Sexual Activity   Alcohol use: Never   Drug use: Never   Sexual activity: Not on file  Other Topics Concern   Not on file  Social History Narrative   ** Merged History Encounter **       Social Drivers of Health   Financial Resource Strain: Low Risk  (09/08/2023)   Overall Financial Resource Strain (CARDIA)    Difficulty of Paying Living Expenses: Not hard at all  Food Insecurity: No Food Insecurity (09/08/2023)   Hunger Vital Sign    Worried About Running Out  of Food in the Last Year: Never true    Ran Out of Food in the Last Year: Never true  Transportation Needs: No Transportation Needs (09/08/2023)   PRAPARE - Administrator, Civil Service (Medical): No    Lack of Transportation (Non-Medical): No  Physical Activity: Inactive (09/08/2023)   Exercise Vital Sign    Days of Exercise per Week: 0 days    Minutes of Exercise per Session: 0 min  Stress: No Stress Concern Present (09/08/2023)   [employer] of Occupational Health - Occupational Stress Questionnaire    Feeling of Stress : Not at all  Social  Connections: Socially Isolated (09/08/2023)   Social Connection and Isolation Panel    Frequency of Communication with Friends and Family: Never    Frequency of Social Gatherings with Friends and Family: Never    Attends Religious Services: Never    Database administrator or Organizations: Yes    Attends Banker Meetings: Never    Marital Status: Never married  Intimate Partner Violence: Not At Risk (09/08/2023)   Humiliation, Afraid, Rape, and Kick questionnaire    Fear of Current or Ex-Partner: No    Emotionally Abused: No    Physically Abused: No    Sexually Abused: No    Outpatient Medications Prior to Visit  Medication Sig Dispense Refill   albuterol  (PROVENTIL ) (2.5 MG/3ML) 0.083% nebulizer solution Take 3 mLs (2.5 mg total) by nebulization every 6 (six) hours as needed for wheezing or shortness of breath. 150 mL 2   atorvastatin  (LIPITOR) 20 MG tablet Take 1 tablet (20 mg total) by mouth daily. 30 tablet 3   azithromycin  (ZITHROMAX ) 500 MG tablet Take 500 mg by mouth daily.     citalopram  (CELEXA ) 40 MG tablet Take 1 tablet (40 mg total) by mouth every morning. 30 tablet 11   clonazePAM  (KLONOPIN ) 0.5 MG tablet Take 0.5 mg by mouth 3 (three) times daily. 1/2 qam, 1/2 q4 pm, 1/2 qhs     docusate sodium  (COLACE) 100 MG capsule Take 1 capsule (100 mg total) by mouth 2 (two) times daily as needed for mild constipation. 60 capsule 11   feeding supplement, ENSURE COMPLETE, (ENSURE COMPLETE) LIQD Take 237 mLs by mouth daily with breakfast. 7110 mL 11   lactulose  (CHRONULAC ) 10 GM/15ML solution TAKE 45ML (30GM) BY MOUTH THREE TIMES DAILY 3784 mL 11   loratadine  (CLARITIN ) 10 MG tablet Take 1 tablet (10 mg total) by mouth daily. 30 tablet 11   montelukast  (SINGULAIR ) 10 MG tablet Take 1 tablet (10 mg total) by mouth daily. 30 tablet 11   Multiple Vitamin (MULTIVITAMIN) tablet Take 1 tablet by mouth daily. 90 tablet 0   nystatin  cream (MYCOSTATIN ) Apply 1 Application topically 2  (two) times daily.SABRA HAMMONDGENERIC MYCOSTATIN **) 30 g 3   pantoprazole  (PROTONIX ) 40 MG tablet TAKE ONE TABLET BY MOUTH TWICE DAILY. (YELLOW OVAL TABLET WITH H126, **GENERIC PROTONIX **) 62 tablet 2   polyethylene glycol (MIRALAX ) 17 g packet Take 17 g by mouth 2 (two) times daily. 60 each 11   predniSONE (STERAPRED UNI-PAK 21 TAB) 5 MG (21) TBPK tablet Take 5 mg by mouth taper from 4 doses each day to 1 dose and stop.     QUEtiapine  (SEROQUEL ) 200 MG tablet Take 200 mg by mouth 3 (three) times daily. 1.5 tab qam, 1.5 tab qpm, 1 tab qhs     sodium phosphate (FLEET) 7-19 GM/118ML ENEM Place 133 mLs (1 enema total) rectally daily as  needed for severe constipation. Dose 4.5 OZ Q72 H PRN. 133 mL 6   Starch, Thickening, LIQD 3 tsps per 4 oz of liquid. Recommend 12 oz liquid (water,tea, or juice) three times a day. 237 mL 11   valsartan  (DIOVAN ) 40 MG tablet Take 1 tablet (40 mg total) by mouth daily. 30 tablet 5   Wheat Dextrin (BENEFIBER ON THE GO) POWD One package daily 28 each 11   levofloxacin (LEVAQUIN) 750 MG tablet Take 750 mg by mouth daily.     No facility-administered medications prior to visit.    Allergies  Allergen Reactions   Augmentin [Amoxicillin-Pot Clavulanate]     Review of Systems  Constitutional:  Positive for fatigue and fever. Negative for appetite change.  HENT:  Positive for congestion. Negative for ear pain, sinus pressure and sore throat.   Eyes: Negative.   Respiratory:  Positive for cough, shortness of breath and wheezing. Negative for chest tightness.   Cardiovascular:  Negative for chest pain and palpitations.  Gastrointestinal:  Negative for abdominal pain, constipation, diarrhea, nausea and vomiting.  Endocrine: Negative.   Genitourinary:  Negative for dysuria and hematuria.  Musculoskeletal:  Negative for arthralgias, back pain, joint swelling and myalgias.  Skin:  Positive for wound (left eyebrow). Negative for rash.  Allergic/Immunologic: Negative.    Neurological:  Negative for dizziness, weakness and headaches.  Hematological: Negative.   Psychiatric/Behavioral:  Negative for dysphoric mood. The patient is not nervous/anxious.        Objective:        05/10/2024   10:24 AM 04/06/2024   11:00 AM 03/16/2024    3:28 PM  Vitals with BMI  Height 6' 1 6' 1 6' 1  Weight 166 lbs 13 oz 164 lbs 164 lbs  BMI 22.01 21.64 21.64  Systolic 126 108 887  Diastolic 74 72 60  Pulse 94 95 88    No data found.   Physical Exam Vitals reviewed.  Constitutional:      General: He is not in acute distress.    Appearance: He is normal weight. He is not ill-appearing.  HENT:     Right Ear: There is impacted cerumen.     Left Ear: There is impacted cerumen.  Eyes:     Conjunctiva/sclera: Conjunctivae normal.  Cardiovascular:     Heart sounds: Normal heart sounds. No murmur heard. Pulmonary:     Effort: Tachypnea present.     Breath sounds: Decreased air movement present. Wheezing and rhonchi present.  Skin:    General: Skin is warm.     Findings: Signs of injury (left eyebrow laceration) present.  Neurological:     Mental Status: Mental status is at baseline.     Health Maintenance Due  Topic Date Due   Zoster Vaccines- Shingrix (1 of 2) Never done   COVID-19 Vaccine (3 - Mixed Product risk series) 10/06/2023   Colonoscopy  03/08/2024    There are no preventive care reminders to display for this patient.   Lab Results  Component Value Date   TSH 1.960 09/08/2023   Lab Results  Component Value Date   WBC 11.8 05/06/2024   HGB 13.5 05/06/2024   HCT 41 05/06/2024   MCV 90 01/22/2024   PLT 245 05/06/2024   Lab Results  Component Value Date   NA 140 05/06/2024   K 4.4 05/06/2024   CO2 28 (A) 05/06/2024   GLUCOSE 129 (H) 01/22/2024   BUN 15 05/06/2024   CREATININE 0.7 05/06/2024  BILITOT 0.3 01/22/2024   ALKPHOS 99 05/06/2024   AST 36 05/06/2024   ALT 32 05/06/2024   PROT 6.3 01/22/2024   ALBUMIN 3.8  05/06/2024   CALCIUM  8.7 05/06/2024   EGFR 105 05/06/2024   Lab Results  Component Value Date   CHOL 123 07/14/2023   Lab Results  Component Value Date   HDL 30 (L) 07/14/2023   Lab Results  Component Value Date   LDLCALC 80 07/14/2023   Lab Results  Component Value Date   TRIG 60 07/14/2023   Lab Results  Component Value Date   CHOLHDL 4.1 07/14/2023   Lab Results  Component Value Date   HGBA1C 5.4 04/26/2024        Results for orders placed or performed in visit on 05/10/24  CBC and differential   Collection Time: 05/06/24 12:00 AM  Result Value Ref Range   Hemoglobin 13.5 13.5 - 17.5   HCT 41 41 - 53   Neutrophils Absolute 10.15    Platelets 245 150 - 400 K/uL   WBC 11.8   CBC   Collection Time: 05/06/24 12:00 AM  Result Value Ref Range   RBC 4.47 3.87 - 5.11  Basic metabolic panel with GFR   Collection Time: 05/06/24 12:00 AM  Result Value Ref Range   Glucose 117    BUN 15 4 - 21   CO2 28 (A) 13 - 22   Creatinine 0.7 0.6 - 1.3   Potassium 4.4 3.5 - 5.1 mEq/L   Sodium 140 137 - 147   Chloride 104 99 - 108  Comprehensive metabolic panel with GFR   Collection Time: 05/06/24 12:00 AM  Result Value Ref Range   eGFR 105    Calcium  8.7 8.7 - 10.7   Albumin 3.8 3.5 - 5.0  Hepatic function panel   Collection Time: 05/06/24 12:00 AM  Result Value Ref Range   Alkaline Phosphatase 99 25 - 125   ALT 32 10 - 40 U/L   AST 36 14 - 40   Bilirubin, Total 0.7      Assessment & Plan:   Assessment & Plan Community acquired pneumonia of right lung, unspecified part of lung Right lower lobe pneumonia Persistent coughing and wheezing. Recurrent pneumonia with dysphagia contributing to potential aspiration. Currently taking prednisone and Azithromycin  and not improving. CBC and CMP drawn at the hospital Lab Results  Component Value Date   WBC 11.8 05/06/2024   HGB 13.5 05/06/2024   HCT 41 05/06/2024   MCV 90 01/22/2024   PLT 245 05/06/2024  - Prescribe  Levaquin 750 mg by mouth for 10 days - Ordered CBC to assess white blood cell count. - Continue prednisone 5 mg daily. - Continued albuterol  as needed every 4-6 hours. Orders:   levofloxacin (LEVAQUIN) 750 MG tablet; Take 1 tablet (750 mg total) by mouth daily for 10 days.   CBC with Differential  Eyebrow laceration, left, subsequent encounter Eye injury Recent eye injury from a fall causing swelling and bruising of the left eye. - Dermabond applied to the laceration at the hospital - CT head and neck - unremarkable  - keep clean and monitor for signs and symptoms of infection     Meds ordered this encounter  Medications   levofloxacin (LEVAQUIN) 750 MG tablet    Sig: Take 1 tablet (750 mg total) by mouth daily for 10 days.    Dispense:  10 tablet    Refill:  0    Orders Placed This  Encounter  Procedures   CBC with Differential   CBC and differential   CBC   Basic metabolic panel with GFR   Comprehensive metabolic panel with GFR   Hepatic function panel     Follow-up: Return in about 6 days (around 05/16/2024) for chronic, PNA fu.  An After Visit Summary was printed and given to the patient.  Harrie Cedar, FNP [facility] [phone]

## 2024-05-10 NOTE — Assessment & Plan Note (Signed)
 Eye injury Recent eye injury from a fall causing swelling and bruising of the left eye. - Dermabond applied to the laceration at the hospital - CT head and neck - unremarkable  - keep clean and monitor for signs and symptoms of infection

## 2024-05-10 NOTE — Assessment & Plan Note (Signed)
 Right lower lobe pneumonia Persistent coughing and wheezing. Recurrent pneumonia with dysphagia contributing to potential aspiration. Currently taking prednisone and Azithromycin  and not improving. CBC and CMP drawn at the hospital Lab Results  Component Value Date   WBC 11.8 05/06/2024   HGB 13.5 05/06/2024   HCT 41 05/06/2024   MCV 90 01/22/2024   PLT 245 05/06/2024  - Prescribe Levaquin 750 mg by mouth for 10 days - Ordered CBC to assess white blood cell count. - Continue prednisone 5 mg daily. - Continued albuterol  as needed every 4-6 hours. Orders:   levofloxacin (LEVAQUIN) 750 MG tablet; Take 1 tablet (750 mg total) by mouth daily for 10 days.   CBC with Differential

## 2024-05-15 ENCOUNTER — Ambulatory Visit: Payer: Self-pay | Admitting: Family Medicine

## 2024-05-15 NOTE — Progress Notes (Signed)
 Subjective:  Patient ID: [name], male    DOB: 1961-04-28  Age: 63 y.o. MRN: [id number]  Chief Complaint  Patient presents with   Medical Management of Chronic Issues    HPI: Discussed the use of AI scribe software for clinical note transcription with the patient, who gave verbal consent to proceed.  History of Present Illness [name] is a 63 year old male with a history of pneumonia and falls who presents for follow-up after recent emergency department visits for pneumonia.  Patient is a 63 year old disabled white male who presented to the emergency department on October 10th with fever and cough.  He was negative for flu, RSV, COVID. CMP normal WBC elevated slightly at 11.8 with a shift of neutrophils 86.9. Procalcitonin 0.06 and lactic acid 2.6. Oxygen saturation remained at 95% on room air with ambulation.  Patient diagnosed with bronchopneumonia and discharged home with Pred pack, azithromycin , and an albuterol  inhaler. Changed to levaquin due to ineffectiveness of zpack.   Respiratory symptoms and pneumonia treatment - Recent emergency department visits for pneumonia - Initial treatment with Zithromax  was ineffective - Switched to Levaquin, previously received in September, with significant improvement - Persistent cough remains, now productive and 'breaking' - Fever of 100.33F prior to emergency department visit - Occasional wheezing - Unable to use albuterol  inhaler effectively; uses nebulizer for medications - Received steroids, antibiotics, and inhaler during emergency department treatment  Swallowing difficulties and aspiration precautions - Uses thickener for food - Eats slowly and remains upright during meals under caregiver supervision  Activity level and functional status - Becomes less active and prefers not to walk when ill - Decreased activity is a sign for caregiver to seek medical attention  Falls and injury risk - History of  falls, including recent fall two days after emergency department visit resulting in head injury - Falls often occur when attempting to retrieve dropped dominoes - Caregiver monitors closely due to fall risk -Patient return to the emergency department on October 13th after falling down while he was leaning over and hitting his head.  The fall resulted in a 3 cm linear laceration along the left eyebrow.  He did not lose consciousness.  Dermabond was used to suture the laceration.  Recommended Motrin and Tylenol for any pain or headaches.  Recommended keep the wound covered to prevent infection.        09/08/2023   11:04 AM 03/26/2020    1:59 PM  Depression screen PHQ 2/9  Decreased Interest 0 0  Down, Depressed, Hopeless 0 0  PHQ - 2 Score 0 0        09/08/2023   11:04 AM  Fall Risk   Falls in the past year? 0  Number falls in past yr: 0  Injury with Fall? 0  Risk for fall due to : No Fall Risks  Follow up Falls evaluation completed    Patient Care Team: Sherre Clapper, MD as PCP - General (Family Medicine) Gretel Ozell PARAS, DPM (Inactive) as Consulting Physician (Podiatry)   Review of Systems  Constitutional:  Negative for appetite change, chills and fever.  HENT:  Negative for congestion.   Respiratory:  Positive for cough. Negative for shortness of breath.   Gastrointestinal:  Negative for abdominal pain.  Neurological:  Negative for facial asymmetry.    Current Outpatient Medications on File Prior to Visit  Medication Sig Dispense Refill   albuterol  (PROVENTIL ) (2.5 MG/3ML) 0.083% nebulizer solution Take 3 mLs (2.5 mg  total) by nebulization every 6 (six) hours as needed for wheezing or shortness of breath. 150 mL 2   atorvastatin  (LIPITOR) 20 MG tablet Take 1 tablet (20 mg total) by mouth daily. 30 tablet 3   citalopram  (CELEXA ) 40 MG tablet Take 1 tablet (40 mg total) by mouth every morning. 30 tablet 11   clonazePAM  (KLONOPIN ) 0.5 MG tablet Take 0.5 mg by mouth 3 (three)  times daily. 1/2 qam, 1/2 q4 pm, 1/2 qhs     docusate sodium  (COLACE) 100 MG capsule Take 1 capsule (100 mg total) by mouth 2 (two) times daily as needed for mild constipation. 60 capsule 11   feeding supplement, ENSURE COMPLETE, (ENSURE COMPLETE) LIQD Take 237 mLs by mouth daily with breakfast. 7110 mL 11   lactulose  (CHRONULAC ) 10 GM/15ML solution TAKE 45ML (30GM) BY MOUTH THREE TIMES DAILY 3784 mL 11   loratadine  (CLARITIN ) 10 MG tablet Take 1 tablet (10 mg total) by mouth daily. 30 tablet 11   montelukast  (SINGULAIR ) 10 MG tablet Take 1 tablet (10 mg total) by mouth daily. 30 tablet 11   Multiple Vitamin (MULTIVITAMIN) tablet Take 1 tablet by mouth daily. 90 tablet 0   nystatin  cream (MYCOSTATIN ) Apply 1 Application topically 2 (two) times daily.SABRA HAMMONDGENERIC MYCOSTATIN **) 30 g 3   pantoprazole  (PROTONIX ) 40 MG tablet TAKE ONE TABLET BY MOUTH TWICE DAILY. (YELLOW OVAL TABLET WITH H126, **GENERIC PROTONIX **) 62 tablet 2   polyethylene glycol (MIRALAX ) 17 g packet Take 17 g by mouth 2 (two) times daily. 60 each 11   QUEtiapine  (SEROQUEL ) 200 MG tablet Take 200 mg by mouth 3 (three) times daily. 1.5 tab qam, 1.5 tab qpm, 1 tab qhs     sodium phosphate (FLEET) 7-19 GM/118ML ENEM Place 133 mLs (1 enema total) rectally daily as needed for severe constipation. Dose 4.5 OZ Q72 H PRN. 133 mL 6   Starch, Thickening, LIQD 3 tsps per 4 oz of liquid. Recommend 12 oz liquid (water,tea, or juice) three times a day. 237 mL 11   valsartan  (DIOVAN ) 40 MG tablet Take 1 tablet (40 mg total) by mouth daily. 30 tablet 5   Wheat Dextrin (BENEFIBER ON THE GO) POWD One package daily 28 each 11   No current facility-administered medications on file prior to visit.   Past Medical History:  Diagnosis Date   Acute bronchitis due to other specified organisms 12/23/2021   Autism    BMI 30.0-30.9,adult 09/26/2019   Chronic idiopathic constipation 06/01/2020   Depression, major, recurrent, mild 09/26/2019   Developmental  non-verbal disorder    Encounter for Medicare annual wellness exam 09/01/2022   Encounter for removal of sutures 03/28/2022   Essential hypertension    Fall on same level 03/28/2022   Gastroesophageal reflux disease without esophagitis 09/26/2019   Hypotension 11/20/2021   Laceration of occipital region of scalp 03/28/2022   Mixed hyperlipidemia    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   Pedal edema 04/10/2021   Prediabetes    Primary hypertension    Reactive airway disease 12/23/2021   Seizure disorder (HCC)    Severe intellectual disabilities    Thyroid  nodule 11/01/2019   Urinary incontinence due to cognitive impairment 04/30/2021   Past Surgical History:  Procedure Laterality Date   LAPAROTOMY  2021    Family History  Family history unknown: Yes   Social History   Socioeconomic History   Marital status: Single    Spouse name: Not on file   Number of children: Not on file   Years  of education: Not on file   Highest education level: Not on file  Occupational History   Not on file  Tobacco Use   Smoking status: Never   Smokeless tobacco: Never  Substance and Sexual Activity   Alcohol use: Never   Drug use: Never   Sexual activity: Not on file  Other Topics Concern   Not on file  Social History Narrative   ** Merged History Encounter **       Social Drivers of Health   Financial Resource Strain: Low Risk  (09/08/2023)   Overall Financial Resource Strain (CARDIA)    Difficulty of Paying Living Expenses: Not hard at all  Food Insecurity: No Food Insecurity (09/08/2023)   Hunger Vital Sign    Worried About Running Out of Food in the Last Year: Never true    Ran Out of Food in the Last Year: Never true  Transportation Needs: No Transportation Needs (09/08/2023)   PRAPARE - Administrator, Civil Service (Medical): No    Lack of Transportation (Non-Medical): No  Physical Activity: Inactive (09/08/2023)   Exercise Vital Sign    Days of Exercise per Week: 0 days     Minutes of Exercise per Session: 0 min  Stress: No Stress Concern Present (09/08/2023)   [employer] of Occupational Health - Occupational Stress Questionnaire    Feeling of Stress : Not at all  Social Connections: Socially Isolated (09/08/2023)   Social Connection and Isolation Panel    Frequency of Communication with Friends and Family: Never    Frequency of Social Gatherings with Friends and Family: Never    Attends Religious Services: Never    Database Administrator or Organizations: Yes    Attends Banker Meetings: Never    Marital Status: Never married    Objective:  BP 110/74   Pulse 77   Temp 98.1 F (36.7 C)   Ht 6' 1 (1.854 m)   Wt 168 lb (76.2 kg)   SpO2 97%   BMI 22.16 kg/m      05/16/2024   10:58 AM 05/10/2024   10:24 AM 04/06/2024   11:00 AM  BP/Weight  Systolic BP 110 126 108  Diastolic BP 74 74 72  Wt. (Lbs) 168 166.8 164  BMI 22.16 kg/m2 22.01 kg/m2 21.64 kg/m2    Physical Exam Vitals reviewed.  Constitutional:      Appearance: Normal appearance.  Cardiovascular:     Rate and Rhythm: Normal rate and regular rhythm.     Heart sounds: Normal heart sounds.  Pulmonary:     Effort: Pulmonary effort is normal.     Breath sounds: Normal breath sounds. No wheezing, rhonchi or rales.  Abdominal:     Palpations: Abdomen is soft.     Tenderness: There is no abdominal tenderness.  Musculoskeletal:     Comments: Laceration above left eyebrow. Well approximation of laceration. No erythema.   Neurological:     Mental Status: He is alert.  Psychiatric:        Mood and Affect: Mood normal.        Behavior: Behavior normal.         Lab Results  Component Value Date   WBC 12.2 (H) 05/10/2024   HGB 14.1 05/10/2024   HCT 42.9 05/10/2024   PLT 331 05/10/2024   GLUCOSE 129 (H) 01/22/2024   CHOL 123 07/14/2023   TRIG 60 07/14/2023   HDL 30 (L) 07/14/2023   LDLCALC 80 07/14/2023  ALT 32 05/06/2024   AST 36 05/06/2024   NA 140  05/06/2024   K 4.4 05/06/2024   CL 104 05/06/2024   CREATININE 0.7 05/06/2024   BUN 15 05/06/2024   CO2 28 (A) 05/06/2024   TSH 1.960 09/08/2023   INR 1.1 12/01/2008   HGBA1C 5.4 04/26/2024    Results for orders placed or performed in visit on 05/10/24  CBC and differential   Collection Time: 05/06/24 12:00 AM  Result Value Ref Range   Hemoglobin 13.5 13.5 - 17.5   HCT 41 41 - 53   Neutrophils Absolute 10.15    Platelets 245 150 - 400 K/uL   WBC 11.8   CBC   Collection Time: 05/06/24 12:00 AM  Result Value Ref Range   RBC 4.47 3.87 - 5.11  Basic metabolic panel with GFR   Collection Time: 05/06/24 12:00 AM  Result Value Ref Range   Glucose 117    BUN 15 4 - 21   CO2 28 (A) 13 - 22   Creatinine 0.7 0.6 - 1.3   Potassium 4.4 3.5 - 5.1 mEq/L   Sodium 140 137 - 147   Chloride 104 99 - 108  Comprehensive metabolic panel with GFR   Collection Time: 05/06/24 12:00 AM  Result Value Ref Range   eGFR 105    Calcium  8.7 8.7 - 10.7   Albumin 3.8 3.5 - 5.0  Hepatic function panel   Collection Time: 05/06/24 12:00 AM  Result Value Ref Range   Alkaline Phosphatase 99 25 - 125   ALT 32 10 - 40 U/L   AST 36 14 - 40   Bilirubin, Total 0.7   CBC with Differential   Collection Time: 05/10/24 11:00 AM  Result Value Ref Range   WBC 12.2 (H) 3.4 - 10.8 x10E3/uL   RBC 4.71 4.14 - 5.80 x10E6/uL   Hemoglobin 14.1 13.0 - 17.7 g/dL   Hematocrit 57.0 62.4 - 51.0 %   MCV 91 79 - 97 fL   MCH 29.9 26.6 - 33.0 pg   MCHC 32.9 31.5 - 35.7 g/dL   RDW 86.9 88.3 - 84.5 %   Platelets 331 150 - 450 x10E3/uL   Neutrophils 83 Not Estab. %   Lymphs 8 Not Estab. %   Monocytes 8 Not Estab. %   Eos 0 Not Estab. %   Basos 0 Not Estab. %   Neutrophils Absolute 10.2 (H) 1.4 - 7.0 x10E3/uL   Lymphocytes Absolute 1.0 0.7 - 3.1 x10E3/uL   Monocytes Absolute 0.9 0.1 - 0.9 x10E3/uL   EOS (ABSOLUTE) 0.0 0.0 - 0.4 x10E3/uL   Basophils Absolute 0.0 0.0 - 0.2 x10E3/uL   Immature Granulocytes 1 Not Estab.  %   Immature Grans (Abs) 0.1 0.0 - 0.1 x10E3/uL  .  Assessment & Plan:   Assessment & Plan Recurrent pneumonia Aspiration pneumonia likely secondary to dysphagia. Recent treatment with Zithromax  and Levaquin improved symptoms. Risk of antibiotic resistance discussed, no prophylactic antibiotics recommended. - Continue Levaquin for a few more days. - Prescribe pulse oximeter for home use to monitor oxygen levels. - Encourage use of thickener for liquids. - Advise to eat slowly and sit upright while eating. - Consider urgent care visits for fever or worsening symptoms instead of emergency department visits. Complete antibiotics and prednisone. Persistent productive cough likely related to resolving pneumonia. Albuterol  inhaler not suitable due to improper use. Consideration of a flutter device to aid in clearing secretions. - Consider use of a flutter device  to aid in clearing secretions.     At high risk for aspiration Due to mental idisabability.  Persistent productive cough likely related to resolving pneumonia. Albuterol  inhaler not suitable due to improper use. Consideration of a flutter device to aid in clearing secretions. - Consider use of a flutter device to aid in clearing secretions.     Laceration of eyebrow and forehead, left, initial encounter Healing. Keep covered to prevent infection. History of falls Recent fall related to reaching for a dropped object. Equipment not suitable due to interference with mobility. - Monitor for signs of instability or frequent falls.     Body mass index is 22.16 kg/m.   No orders of the defined types were placed in this encounter.   No orders of the defined types were placed in this encounter.    Follow-up: No follow-ups on file.  An After Visit Summary was printed and given to the patient.  Abigail Free, MD [facility] [phone]

## 2024-05-16 ENCOUNTER — Ambulatory Visit (INDEPENDENT_AMBULATORY_CARE_PROVIDER_SITE_OTHER): Admitting: Family Medicine

## 2024-05-16 VITALS — BP 110/74 | HR 77 | Temp 98.1°F | Ht 73.0 in | Wt 168.0 lb

## 2024-05-16 DIAGNOSIS — Z9189 Other specified personal risk factors, not elsewhere classified: Secondary | ICD-10-CM | POA: Diagnosis not present

## 2024-05-16 DIAGNOSIS — S01112A Laceration without foreign body of left eyelid and periocular area, initial encounter: Secondary | ICD-10-CM

## 2024-05-16 DIAGNOSIS — J189 Pneumonia, unspecified organism: Secondary | ICD-10-CM | POA: Diagnosis not present

## 2024-05-16 DIAGNOSIS — S0181XA Laceration without foreign body of other part of head, initial encounter: Secondary | ICD-10-CM

## 2024-05-16 NOTE — Assessment & Plan Note (Addendum)
 Due to mental idisabability.  Persistent productive cough likely related to resolving pneumonia. Albuterol  inhaler not suitable due to improper use. Consideration of a flutter device to aid in clearing secretions. - Consider use of a flutter device to aid in clearing secretions.

## 2024-05-16 NOTE — Assessment & Plan Note (Addendum)
 Aspiration pneumonia likely secondary to dysphagia. Recent treatment with Zithromax  and Levaquin improved symptoms. Risk of antibiotic resistance discussed, no prophylactic antibiotics recommended. - Continue Levaquin for a few more days. - Prescribe pulse oximeter for home use to monitor oxygen levels. - Encourage use of thickener for liquids. - Advise to eat slowly and sit upright while eating. - Consider urgent care visits for fever or worsening symptoms instead of emergency department visits. Complete antibiotics and prednisone. Persistent productive cough likely related to resolving pneumonia. Albuterol  inhaler not suitable due to improper use. Consideration of a flutter device to aid in clearing secretions. - Consider use of a flutter device to aid in clearing secretions.

## 2024-05-22 ENCOUNTER — Other Ambulatory Visit: Payer: Self-pay | Admitting: Family Medicine

## 2024-05-22 ENCOUNTER — Encounter: Payer: Self-pay | Admitting: Family Medicine

## 2024-05-22 DIAGNOSIS — E782 Mixed hyperlipidemia: Secondary | ICD-10-CM

## 2024-05-26 ENCOUNTER — Other Ambulatory Visit: Payer: Self-pay | Admitting: Family Medicine

## 2024-05-26 DIAGNOSIS — K219 Gastro-esophageal reflux disease without esophagitis: Secondary | ICD-10-CM

## 2024-05-26 DIAGNOSIS — K5904 Chronic idiopathic constipation: Secondary | ICD-10-CM

## 2024-05-26 MED ORDER — STARCH (THICKENING) PO LIQD
ORAL | 11 refills | Status: AC
Start: 2024-05-26 — End: ?

## 2024-05-26 MED ORDER — BENEFIBER ON THE GO PO POWD: ORAL | 11 refills | Status: AC

## 2024-05-26 NOTE — Telephone Encounter (Signed)
 Copied from CRM [id number]. Topic: Clinical - Medication Refill >> May 26, 2024  2:06 PM Santiya F wrote: Medication: Wheat Dextrin (BENEFIBER ON THE GO) POWD [[id number]], Starch, Thickening, LIQD [[id number]]  Has the patient contacted their pharmacy? Yes  (Agent: If yes, when and what did the pharmacy advise?) contact office   This is the patient's preferred pharmacy:    [id number] [place], [place] - [address] AT [facility] [address] [place] [address] Phone: [phone] Fax: [phone]  Is this the correct pharmacy for this prescription? Yes If no, delete pharmacy and type the correct one.   Has the prescription been filled recently? Yes  Is the patient out of the medication? No  Has the patient been seen for an appointment in the last year OR does the patient have an upcoming appointment? Yes  Can we respond through MyChart? No  Agent: Please be advised that Rx refills may take up to 3 business days. We ask that you follow-up with your pharmacy.

## 2024-07-13 ENCOUNTER — Encounter: Payer: Self-pay | Admitting: Family Medicine

## 2024-07-13 DIAGNOSIS — E441 Mild protein-calorie malnutrition: Secondary | ICD-10-CM | POA: Insufficient documentation

## 2024-08-01 ENCOUNTER — Ambulatory Visit: Payer: Self-pay

## 2024-08-01 ENCOUNTER — Encounter: Payer: Self-pay | Admitting: Family Medicine

## 2024-08-01 ENCOUNTER — Ambulatory Visit: Payer: Self-pay | Admitting: Family Medicine

## 2024-08-01 ENCOUNTER — Ambulatory Visit (INDEPENDENT_AMBULATORY_CARE_PROVIDER_SITE_OTHER): Admitting: Family Medicine

## 2024-08-01 VITALS — BP 118/66 | HR 92 | Temp 98.6°F | Resp 18 | Ht 73.0 in | Wt 171.4 lb

## 2024-08-01 DIAGNOSIS — J069 Acute upper respiratory infection, unspecified: Secondary | ICD-10-CM

## 2024-08-01 DIAGNOSIS — R509 Fever, unspecified: Secondary | ICD-10-CM

## 2024-08-01 DIAGNOSIS — Z8701 Personal history of pneumonia (recurrent): Secondary | ICD-10-CM

## 2024-08-01 DIAGNOSIS — K5904 Chronic idiopathic constipation: Secondary | ICD-10-CM | POA: Diagnosis not present

## 2024-08-01 DIAGNOSIS — R051 Acute cough: Secondary | ICD-10-CM | POA: Diagnosis not present

## 2024-08-01 LAB — POCT FLU A/B STATUS
Influenza A, POC: NEGATIVE
Influenza B, POC: NEGATIVE

## 2024-08-01 LAB — POC COVID19 BINAXNOW: SARS Coronavirus 2 Ag: NEGATIVE

## 2024-08-01 MED ORDER — AZITHROMYCIN 250 MG PO TABS: ORAL_TABLET | ORAL | 0 refills | Status: DC

## 2024-08-01 MED ORDER — DOXYCYCLINE HYCLATE 100 MG PO TABS: 100.0000 mg | ORAL_TABLET | Freq: Two times a day (BID) | ORAL | 0 refills | Status: DC

## 2024-08-01 NOTE — Progress Notes (Signed)
 "  Acute Office Visit  Subjective:    Patient ID: [name], male    DOB: 01-31-1961, 64 y.o.   MRN: [id number]  Chief Complaint  Patient presents with   Cough   Diarrhea    Discussed the use of AI scribe software for clinical note transcription with the patient, who gave verbal consent to proceed.  History of Present Illness   [name] is a 64 year old male with recurrent pneumonia who presents with cough and fever. He is accompanied by Tanisha Barcliff, his caregiver.  Respiratory symptoms - Cough onset yesterday, associated with shortness of breath and wheezing during coughing episodes - Fever to 100.80F since yesterday - Sore throat attributed to frequent coughing - Runny nose present - No night sweats or sneezing - History of recurrent pneumonia with episodes in September and October, requiring two to three rounds of antibiotics for resolution  Gastrointestinal symptoms - Alternating diarrhea and constipation over the past few days - Diarrhea this morning; no bowel movement yesterday - Abdominal distension with firm abdomen reported by caregiver - History of abdominal surgery  Appetite and hydration - Decreased appetite and poor oral intake - No urinary symptoms, but decreased urine output yesterday, raising concern for possible dehydration  Swallowing difficulties - Baseline dysphagia present       Past Medical History:  Diagnosis Date   Acute bronchitis due to other specified organisms 12/23/2021   Autism    BMI 30.0-30.9,adult 09/26/2019   Chronic idiopathic constipation 06/01/2020   Depression, major, recurrent, mild 09/26/2019   Developmental non-verbal disorder    Encounter for Medicare annual wellness exam 09/01/2022   Encounter for removal of sutures 03/28/2022   Essential hypertension    Fall on same level 03/28/2022   Gastroesophageal reflux disease without esophagitis 09/26/2019   Hypotension 11/20/2021   Laceration of  occipital region of scalp 03/28/2022   Mixed hyperlipidemia    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   Pedal edema 04/10/2021   Prediabetes    Primary hypertension    Reactive airway disease 12/23/2021   Seizure disorder (HCC)    Severe intellectual disabilities    Thyroid  nodule 11/01/2019   Urinary incontinence due to cognitive impairment 04/30/2021    Past Surgical History:  Procedure Laterality Date   LAPAROTOMY  2021    Family History  Family history unknown: Yes    Social History   Socioeconomic History   Marital status: Single    Spouse name: Not on file   Number of children: Not on file   Years of education: Not on file   Highest education level: Not on file  Occupational History   Not on file  Tobacco Use   Smoking status: Never   Smokeless tobacco: Never  Substance and Sexual Activity   Alcohol use: Never   Drug use: Never   Sexual activity: Not on file  Other Topics Concern   Not on file  Social History Narrative   ** Merged History Encounter **       Social Drivers of Health   Tobacco Use: Low Risk (08/01/2024)   Patient History    Smoking Tobacco Use: Never    Smokeless Tobacco Use: Never    Passive Exposure: Not on file  Financial Resource Strain: Low Risk (09/08/2023)   Overall Financial Resource Strain (CARDIA)    Difficulty of Paying Living Expenses: Not hard at all  Food Insecurity: No Food Insecurity (09/08/2023)   Hunger Vital Sign    Worried About  Running Out of Food in the Last Year: Never true    Ran Out of Food in the Last Year: Never true  Transportation Needs: No Transportation Needs (09/08/2023)   PRAPARE - Administrator, Civil Service (Medical): No    Lack of Transportation (Non-Medical): No  Physical Activity: Inactive (09/08/2023)   Exercise Vital Sign    Days of Exercise per Week: 0 days    Minutes of Exercise per Session: 0 min  Stress: No Stress Concern Present (09/08/2023)   [employer] of Occupational Health -  Occupational Stress Questionnaire    Feeling of Stress : Not at all  Social Connections: Socially Isolated (09/08/2023)   Social Connection and Isolation Panel    Frequency of Communication with Friends and Family: Never    Frequency of Social Gatherings with Friends and Family: Never    Attends Religious Services: Never    Database Administrator or Organizations: Yes    Attends Banker Meetings: Never    Marital Status: Never married  Intimate Partner Violence: Not At Risk (09/08/2023)   Humiliation, Afraid, Rape, and Kick questionnaire    Fear of Current or Ex-Partner: No    Emotionally Abused: No    Physically Abused: No    Sexually Abused: No  Depression (PHQ2-9): Low Risk (09/08/2023)   Depression (PHQ2-9)    PHQ-2 Score: 0  Alcohol Screen: Low Risk (09/08/2023)   Alcohol Screen    Last Alcohol Screening Score (AUDIT): 0  Housing: Low Risk (09/08/2023)   Housing Stability Vital Sign    Unable to Pay for Housing in the Last Year: No    Number of Times Moved in the Last Year: 0    Homeless in the Last Year: No  Utilities: Not At Risk (09/08/2023)   AHC Utilities    Threatened with loss of utilities: No  Health Literacy: Adequate Health Literacy (09/08/2023)   B1300 Health Literacy    Frequency of need for help with medical instructions: Never    Outpatient Medications Prior to Visit  Medication Sig Dispense Refill   albuterol  (PROVENTIL ) (2.5 MG/3ML) 0.083% nebulizer solution Take 3 mLs (2.5 mg total) by nebulization every 6 (six) hours as needed for wheezing or shortness of breath. 150 mL 2   atorvastatin  (LIPITOR) 20 MG tablet TAKE ONE TABLET BY MOUTH EVERY DAY. (WHITE OVAL TABLET WITH 114 OR MA 2, **GENERIC LIPITOR**) 31 tablet 3   citalopram  (CELEXA ) 40 MG tablet Take 1 tablet (40 mg total) by mouth every morning. 30 tablet 11   clonazePAM  (KLONOPIN ) 0.5 MG tablet Take 0.5 mg by mouth 3 (three) times daily. 1/2 qam, 1/2 q4 pm, 1/2 qhs     docusate sodium  (COLACE)  100 MG capsule Take 1 capsule (100 mg total) by mouth 2 (two) times daily as needed for mild constipation. 60 capsule 11   feeding supplement, ENSURE COMPLETE, (ENSURE COMPLETE) LIQD Take 237 mLs by mouth daily with breakfast. 7110 mL 11   lactulose  (CHRONULAC ) 10 GM/15ML solution TAKE 45ML (30GM) BY MOUTH THREE TIMES DAILY 3784 mL 11   loratadine  (CLARITIN ) 10 MG tablet Take 1 tablet (10 mg total) by mouth daily. 30 tablet 11   montelukast  (SINGULAIR ) 10 MG tablet Take 1 tablet (10 mg total) by mouth daily. 30 tablet 11   Multiple Vitamin (MULTIVITAMIN) tablet Take 1 tablet by mouth daily. 90 tablet 0   nystatin  cream (MYCOSTATIN ) Apply 1 Application topically 2 (two) times daily.SABRA HAMMONDGENERIC MYCOSTATIN **) 30 g 3  pantoprazole  (PROTONIX ) 40 MG tablet TAKE ONE TABLET BY MOUTH TWICE DAILY. (YELLOW OVAL TABLET WITH H126, **GENERIC PROTONIX **) 62 tablet 2   polyethylene glycol (MIRALAX ) 17 g packet Take 17 g by mouth 2 (two) times daily. 60 each 11   QUEtiapine  (SEROQUEL ) 200 MG tablet Take 200 mg by mouth 3 (three) times daily. 1.5 tab qam, 1.5 tab qpm, 1 tab qhs     sodium phosphate (FLEET) 7-19 GM/118ML ENEM Place 133 mLs (1 enema total) rectally daily as needed for severe constipation. Dose 4.5 OZ Q72 H PRN. 133 mL 6   Starch, Thickening, LIQD 3 tsps per 4 oz of liquid. Recommend 12 oz liquid (water,tea, or juice) three times a day. 237 mL 11   valsartan  (DIOVAN ) 40 MG tablet Take 1 tablet (40 mg total) by mouth daily. 30 tablet 5   Wheat Dextrin (BENEFIBER ON THE GO) POWD One package daily 28 each 11   No facility-administered medications prior to visit.    Allergies[1]  Review of Systems  Constitutional:  Positive for appetite change and fever (100.3). Negative for fatigue.  HENT:  Positive for rhinorrhea, sore throat and trouble swallowing (baseline). Negative for congestion, ear pain, sinus pressure and sneezing.   Eyes: Negative.   Respiratory:  Positive for cough, shortness of breath  and wheezing. Negative for chest tightness.   Cardiovascular:  Negative for chest pain and palpitations.  Gastrointestinal:  Positive for constipation and diarrhea. Negative for abdominal pain, nausea and vomiting.  Endocrine: Negative.   Genitourinary:  Positive for decreased urine volume. Negative for dysuria and hematuria.  Musculoskeletal:  Negative for arthralgias, back pain, joint swelling and myalgias.  Skin:  Negative for rash.  Allergic/Immunologic: Negative.   Neurological:  Negative for dizziness, weakness, light-headedness and headaches.  Psychiatric/Behavioral:  Negative for dysphoric mood. The patient is not nervous/anxious.        Objective:        08/01/2024   10:38 AM 05/16/2024   10:58 AM 05/10/2024   10:24 AM  Vitals with BMI  Height 6' 1 6' 1 6' 1  Weight 171 lbs 6 oz 168 lbs 166 lbs 13 oz  BMI 22.62 22.17 22.01  Systolic 118 110 873  Diastolic 66 74 74  Pulse 92 77 94    No data found.   Physical Exam Vitals reviewed.  Constitutional:      General: He is not in acute distress.    Appearance: Normal appearance. He is normal weight. He is not ill-appearing.  HENT:     Right Ear: Tympanic membrane normal. There is impacted cerumen (slightly).     Left Ear: Tympanic membrane normal. There is no impacted cerumen.     Nose: No congestion or rhinorrhea.  Eyes:     Conjunctiva/sclera: Conjunctivae normal.  Cardiovascular:     Rate and Rhythm: Regular rhythm. Tachycardia present.     Heart sounds: Normal heart sounds. No murmur heard. Pulmonary:     Effort: Pulmonary effort is normal. No respiratory distress.     Breath sounds: Normal breath sounds. No wheezing or rhonchi.  Chest:     Chest wall: No tenderness.  Abdominal:     General: There is no distension.     Palpations: Abdomen is soft.  Musculoskeletal:     Cervical back: Normal range of motion.  Skin:    General: Skin is warm.  Neurological:     Mental Status: He is alert. Mental status  is at baseline.  Psychiatric:  Mood and Affect: Mood normal.        Behavior: Behavior normal.     Health Maintenance Due  Topic Date Due   Zoster Vaccines- Shingrix (1 of 2) Never done   COVID-19 Vaccine (3 - Mixed Product risk series) 10/06/2023   Colonoscopy  03/08/2024   Medicare Annual Wellness (AWV)  09/07/2024    There are no preventive care reminders to display for this patient.   Lab Results  Component Value Date   TSH 1.960 09/08/2023   Lab Results  Component Value Date   WBC 12.2 (H) 05/10/2024   HGB 14.1 05/10/2024   HCT 42.9 05/10/2024   MCV 91 05/10/2024   PLT 331 05/10/2024   Lab Results  Component Value Date   NA 140 05/06/2024   K 4.4 05/06/2024   CO2 28 (A) 05/06/2024   GLUCOSE 129 (H) 01/22/2024   BUN 15 05/06/2024   CREATININE 0.7 05/06/2024   BILITOT 0.3 01/22/2024   ALKPHOS 99 05/06/2024   AST 36 05/06/2024   ALT 32 05/06/2024   PROT 6.3 01/22/2024   ALBUMIN 3.8 05/06/2024   CALCIUM  8.7 05/06/2024   EGFR 105 05/06/2024   Lab Results  Component Value Date   CHOL 123 07/14/2023   Lab Results  Component Value Date   HDL 30 (L) 07/14/2023   Lab Results  Component Value Date   LDLCALC 80 07/14/2023   Lab Results  Component Value Date   TRIG 60 07/14/2023   Lab Results  Component Value Date   CHOLHDL 4.1 07/14/2023   Lab Results  Component Value Date   HGBA1C 5.4 04/26/2024        Results for orders placed or performed in visit on 05/10/24  CBC and differential   Collection Time: 05/06/24 12:00 AM  Result Value Ref Range   Hemoglobin 13.5 13.5 - 17.5   HCT 41 41 - 53   Neutrophils Absolute 10.15    Platelets 245 150 - 400 K/uL   WBC 11.8   CBC   Collection Time: 05/06/24 12:00 AM  Result Value Ref Range   RBC 4.47 3.87 - 5.11  Basic metabolic panel with GFR   Collection Time: 05/06/24 12:00 AM  Result Value Ref Range   Glucose 117    BUN 15 4 - 21   CO2 28 (A) 13 - 22   Creatinine 0.7 0.6 - 1.3    Potassium 4.4 3.5 - 5.1 mEq/L   Sodium 140 137 - 147   Chloride 104 99 - 108  Comprehensive metabolic panel with GFR   Collection Time: 05/06/24 12:00 AM  Result Value Ref Range   eGFR 105    Calcium  8.7 8.7 - 10.7   Albumin 3.8 3.5 - 5.0  Hepatic function panel   Collection Time: 05/06/24 12:00 AM  Result Value Ref Range   Alkaline Phosphatase 99 25 - 125   ALT 32 10 - 40 U/L   AST 36 14 - 40   Bilirubin, Total 0.7   CBC with Differential   Collection Time: 05/10/24 11:00 AM  Result Value Ref Range   WBC 12.2 (H) 3.4 - 10.8 x10E3/uL   RBC 4.71 4.14 - 5.80 x10E6/uL   Hemoglobin 14.1 13.0 - 17.7 g/dL   Hematocrit 57.0 62.4 - 51.0 %   MCV 91 79 - 97 fL   MCH 29.9 26.6 - 33.0 pg   MCHC 32.9 31.5 - 35.7 g/dL   RDW 86.9 88.3 - 84.5 %  Platelets 331 150 - 450 x10E3/uL   Neutrophils 83 Not Estab. %   Lymphs 8 Not Estab. %   Monocytes 8 Not Estab. %   Eos 0 Not Estab. %   Basos 0 Not Estab. %   Neutrophils Absolute 10.2 (H) 1.4 - 7.0 x10E3/uL   Lymphocytes Absolute 1.0 0.7 - 3.1 x10E3/uL   Monocytes Absolute 0.9 0.1 - 0.9 x10E3/uL   EOS (ABSOLUTE) 0.0 0.0 - 0.4 x10E3/uL   Basophils Absolute 0.0 0.0 - 0.2 x10E3/uL   Immature Granulocytes 1 Not Estab. %   Immature Grans (Abs) 0.1 0.0 - 0.1 x10E3/uL     Assessment & Plan:   Assessment & Plan Fever, unspecified fever cause Acute respiratory infection with fever, cough, and sore throat. Differential includes pneumonia recurrence, flu, or COVID-19. Decision to treat empirically due to recurrent pneumonia and frequent hospitalizations. - Ordered flu and COVID-19 tests - negative - Ordered CBC to assess for infection. Orders:   azithromycin  (ZITHROMAX ) 250 MG tablet; 2 DAILY FOR FIRST DAY, THEN DECREASE TO ONE DAILY FOR 4 MORE DAYS.   doxycycline  (VIBRA -TABS) 100 MG tablet; Take 1 tablet (100 mg total) by mouth 2 (two) times daily for 5 days.  Acute cough Acute cough with fever that started yesterday. Differentials include Flu,  Covid or aspiration pneumonia. - Flu and Covid - negative Orders:   POC COVID-19   POCT Flu A & B Status   azithromycin  (ZITHROMAX ) 250 MG tablet; 2 DAILY FOR FIRST DAY, THEN DECREASE TO ONE DAILY FOR 4 MORE DAYS.   doxycycline  (VIBRA -TABS) 100 MG tablet; Take 1 tablet (100 mg total) by mouth 2 (two) times daily for 5 days.  Upper respiratory tract infection, unspecified type Acute respiratory infection with fever in a patient with history of recurrent pneumonia Acute respiratory infection with fever, cough, and sore throat. Differential includes pneumonia recurrence, flu, or COVID-19. Decision to treat empirically due to recurrent pneumonia and frequent hospitalizations. - Ordered flu and COVID-19 tests. - Ordered CBC to assess for infection. - Prescribed doxycycline  100 mg, take one tablet twice a day for five days. - Prescribed azithromycin  (Zithromax ) 250 mg, take two tablets on day one, then one tablet daily for days two through five. Orders:   azithromycin  (ZITHROMAX ) 250 MG tablet; 2 DAILY FOR FIRST DAY, THEN DECREASE TO ONE DAILY FOR 4 MORE DAYS.   doxycycline  (VIBRA -TABS) 100 MG tablet; Take 1 tablet (100 mg total) by mouth 2 (two) times daily for 5 days.  History of pneumonia, recurrent Last reported pneumonia was in October, which the patient received several rounds of antibiotics. Decision to treat empirically due to recurrent pneumonia and frequent hospitalizations. Orders:   azithromycin  (ZITHROMAX ) 250 MG tablet; 2 DAILY FOR FIRST DAY, THEN DECREASE TO ONE DAILY FOR 4 MORE DAYS.   doxycycline  (VIBRA -TABS) 100 MG tablet; Take 1 tablet (100 mg total) by mouth 2 (two) times daily for 5 days.  Chronic idiopathic constipation Chronic idiopathic constipation Alternating diarrhea and constipation with recent diarrhea onset. Concerns about potential blockage due to abdominal distension and fluctuating bowel movements. - Ordered abdominal x-ray to check for any blockage.    Orders:   DG Abd 1 View; Future     Follow-up: Return if symptoms worsen or fail to improve.  An After Visit Summary was printed and given to the patient.  Harrie Cedar, FNP [facility] [phone]      [1]  Allergies Allergen Reactions   Augmentin [Amoxicillin-Pot Clavulanate]    "

## 2024-08-01 NOTE — Patient Instructions (Signed)
" °  VISIT SUMMARY: Today, you were seen for a cough, fever, and other symptoms. Given your history of recurrent pneumonia, we are taking a proactive approach to your treatment. We also discussed your gastrointestinal symptoms, including alternating diarrhea and constipation.  YOUR PLAN: -ACUTE RESPIRATORY INFECTION WITH FEVER: You have an acute respiratory infection, which means you have a sudden onset of symptoms like cough, fever, and sore throat. Given your history of recurrent pneumonia, we are treating you with antibiotics to prevent complications. You will take doxycycline  100 mg twice a day for five days and azithromycin  250 mg, two tablets on the first day, then one tablet daily for the next four days. We have also ordered flu and COVID-19 tests, as well as a complete blood count (CBC) to check for infection.  -CHRONIC IDIOPATHIC CONSTIPATION: Chronic idiopathic constipation means you have ongoing constipation without a known cause. You have been experiencing alternating diarrhea and constipation, along with abdominal distension. We have ordered an abdominal x-ray to check for any blockage.  INSTRUCTIONS: Please follow the prescribed antibiotic regimen carefully. Take doxycycline  100 mg twice a day for five days and azithromycin  250 mg, two tablets on the first day, then one tablet daily for the next four days. Make sure to complete the full course of antibiotics even if you start feeling better. We will contact you with the results of your flu and COVID-19 tests, as well as your CBC. Additionally, we have ordered an abdominal x-ray to investigate your gastrointestinal symptoms. Please stay hydrated and monitor your symptoms. If you experience any worsening of symptoms or new symptoms, contact our office immediately.                      Contains text generated by [employer].       "

## 2024-08-01 NOTE — Assessment & Plan Note (Signed)
 Chronic idiopathic constipation Alternating diarrhea and constipation with recent diarrhea onset. Concerns about potential blockage due to abdominal distension and fluctuating bowel movements. - Ordered abdominal x-ray to check for any blockage.   Orders:   DG Abd 1 View; Future

## 2024-08-01 NOTE — Telephone Encounter (Signed)
 FYI Only or Action Required?: FYI only for provider: appointment scheduled on 08/01/24.  Patient was last seen in primary care on 05/16/2024 by Sherre Clapper, MD.  Called Nurse Triage reporting Cough.  Symptoms began yesterday.  Interventions attempted: OTC medications: Tylenol.  Symptoms are: unchanged.  Triage Disposition: See Physician Within 24 Hours  Patient/caregiver understands and will follow disposition?: Yes             Copied from CRM #[id number]. Topic: Clinical - Red Word Triage >> Aug 01, 2024  8:48 AM Mesmerise C wrote: [facility] that prompted transfer to Nurse Triage: Patient spiked a fever yesterday, hasn't had an appetite to eat, wasn't able to use the bathroom yesterday but today has diarrhea, has a persistent dry cough fever is gone today provided him Tylenol as well, Lonell stated had pnuemonia in September and October doesn't know if it's coming back Reason for Disposition  [1] Continuous (nonstop) coughing interferes with work or school AND [2] no improvement using cough treatment per Care Advice  Answer Assessment - Initial Assessment Questions 1. ONSET: When did the cough begin?      Yesterday   2. SEVERITY: How bad is the cough today?      Constant dry cough   3. SPUTUM: Describe the color of your sputum (e.g., none, dry cough; clear, white, yellow, green)     Dry cough   4. HEMOPTYSIS: Are you coughing up any blood? If Yes, ask: How much? (e.g., flecks, streaks, tablespoons, etc.)     No   5. DIFFICULTY BREATHING: Are you having difficulty breathing? If Yes, ask: How bad is it? (e.g., mild, moderate, severe)      SOB noted with cough  6. FEVER: Do you have a fever? If Yes, ask: What is your temperature, how was it measured, and when did it start?     Yes, yesterday; not today  7. CARDIAC HISTORY: Do you have any history of heart disease? (e.g., heart attack, congestive heart failure)      No   8. LUNG HISTORY: Do you have  any history of lung disease?  (e.g., pulmonary embolus, asthma, emphysema)     No   10. OTHER SYMPTOMS: Do you have any other symptoms? (e.g., runny nose, wheezing, chest pain)       Diarrhea noted 3 times in the last 24 hours.      Patient's caregiver called in to triage with complaints of dry cough, possible sore throat in the patient.  This has been ongoing since yesterday. SOB noted with the cough.  For home care, the patient is taking Tylenol. Lupie stated he has a history of pneumonia.   Appointment scheduled for further evaluation; Patient agrees with the plan of care, and will reach out if symptoms worsen or persist.  Protocols used: Cough - Acute Non-Productive-A-AH

## 2024-08-03 ENCOUNTER — Ambulatory Visit: Payer: Self-pay | Admitting: Family Medicine

## 2024-08-05 ENCOUNTER — Other Ambulatory Visit: Payer: Self-pay | Admitting: Family Medicine

## 2024-08-05 ENCOUNTER — Other Ambulatory Visit: Payer: Self-pay

## 2024-08-05 ENCOUNTER — Encounter: Payer: Self-pay | Admitting: Family Medicine

## 2024-08-05 ENCOUNTER — Ambulatory Visit: Admitting: Family Medicine

## 2024-08-05 VITALS — BP 124/70 | HR 96 | Temp 98.1°F | Ht 73.0 in | Wt 173.0 lb

## 2024-08-05 DIAGNOSIS — Z8701 Personal history of pneumonia (recurrent): Secondary | ICD-10-CM | POA: Diagnosis not present

## 2024-08-05 DIAGNOSIS — J40 Bronchitis, not specified as acute or chronic: Secondary | ICD-10-CM

## 2024-08-05 DIAGNOSIS — K5904 Chronic idiopathic constipation: Secondary | ICD-10-CM

## 2024-08-05 DIAGNOSIS — Z23 Encounter for immunization: Secondary | ICD-10-CM | POA: Diagnosis not present

## 2024-08-05 DIAGNOSIS — K219 Gastro-esophageal reflux disease without esophagitis: Secondary | ICD-10-CM

## 2024-08-05 DIAGNOSIS — I10 Essential (primary) hypertension: Secondary | ICD-10-CM

## 2024-08-05 MED ORDER — LUBIPROSTONE 24 MCG PO CAPS: 24.0000 ug | ORAL_CAPSULE | Freq: Two times a day (BID) | ORAL | 2 refills | Status: AC

## 2024-08-05 MED ORDER — CEFDINIR 300 MG PO CAPS: 300.0000 mg | ORAL_CAPSULE | Freq: Two times a day (BID) | ORAL | 0 refills | Status: AC

## 2024-08-05 MED ORDER — DOXYCYCLINE HYCLATE 100 MG PO TABS: 100.0000 mg | ORAL_TABLET | Freq: Two times a day (BID) | ORAL | 0 refills | Status: AC

## 2024-08-05 NOTE — Progress Notes (Signed)
 "  Acute Office Visit  Subjective:    Patient ID: [name], male    DOB: Aug 17, 1960, 64 y.o.   MRN: [id number]  Chief Complaint  Patient presents with   Medical Management of Chronic Issues    Discussed the use of AI scribe software for clinical note transcription with the patient, who gave verbal consent to proceed.  History of Present Illness [name] is a 64 year old male who presents with ongoing gastrointestinal issues and recent respiratory concerns.  Altered bowel habits and abdominal discomfort - Infrequent bowel movements despite use of Colace 100 mg twice daily, lactulose  45 mL three times daily, and Miralax  twice daily - Baseline bowel pattern is three to five bowel movements per day; currently experiencing minimal bowel movements - Stool consistency is not hard ('not like rocks'), but not normal - One week ago, experienced significant abdominal discomfort and pain after two days without a bowel movement. - Abdominal x-ray negative for obstruction LAST WEEK. I SENT FOR CXR TODAY (SEE BELOW) AND RADIOLOGY CALLED CONCERNED ABOUT A BOWEL OBSTRUCTION AND NEEDS A CT.  - Ongoing issues with bowel movements and intermittent abdominal discomfort  Respiratory symptoms - Recent fever with one night of temperature spike, now resolved - Coughing, particularly when lying down - Completed course of Zithromax ; currently on last day of doxycycline  for respiratory symptoms - CONTINUES TO HAVE SHORTNESS OF BREATH/COUGHING.       Past Medical History:  Diagnosis Date   Acute bronchitis due to other specified organisms 12/23/2021   Autism    BMI 30.0-30.9,adult 09/26/2019   Chronic idiopathic constipation 06/01/2020   Depression, major, recurrent, mild 09/26/2019   Developmental non-verbal disorder    Encounter for Medicare annual wellness exam 09/01/2022   Encounter for removal of sutures 03/28/2022   Essential hypertension    Fall on same level  03/28/2022   Gastroesophageal reflux disease without esophagitis 09/26/2019   Hypotension 11/20/2021   Laceration of occipital region of scalp 03/28/2022   Mixed hyperlipidemia    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   Pedal edema 04/10/2021   Prediabetes    Primary hypertension    Reactive airway disease 12/23/2021   Seizure disorder (HCC)    Severe intellectual disabilities    Thyroid  nodule 11/01/2019   Urinary incontinence due to cognitive impairment 04/30/2021    Past Surgical History:  Procedure Laterality Date   LAPAROTOMY  2021    Family History  Family history unknown: Yes    Social History   Socioeconomic History   Marital status: Single    Spouse name: Not on file   Number of children: Not on file   Years of education: Not on file   Highest education level: Not on file  Occupational History   Not on file  Tobacco Use   Smoking status: Never   Smokeless tobacco: Never  Substance and Sexual Activity   Alcohol use: Never   Drug use: Never   Sexual activity: Not on file  Other Topics Concern   Not on file  Social History Narrative   ** Merged History Encounter **       Social Drivers of Health   Tobacco Use: Low Risk (08/05/2024)   Patient History    Smoking Tobacco Use: Never    Smokeless Tobacco Use: Never    Passive Exposure: Not on file  Financial Resource Strain: Low Risk (09/08/2023)   Overall Financial Resource Strain (CARDIA)    Difficulty of Paying Living Expenses: Not hard  at all  Food Insecurity: No Food Insecurity (08/05/2024)   Epic    Worried About Programme Researcher, Broadcasting/film/video in the Last Year: Never true    Ran Out of Food in the Last Year: Never true  Transportation Needs: No Transportation Needs (08/05/2024)   Epic    Lack of Transportation (Medical): No    Lack of Transportation (Non-Medical): No  Physical Activity: Inactive (09/08/2023)   Exercise Vital Sign    Days of Exercise per Week: 0 days    Minutes of Exercise per Session: 0 min  Stress: No  Stress Concern Present (09/08/2023)   [employer] of Occupational Health - Occupational Stress Questionnaire    Feeling of Stress : Not at all  Social Connections: Socially Isolated (09/08/2023)   Social Connection and Isolation Panel    Frequency of Communication with Friends and Family: Never    Frequency of Social Gatherings with Friends and Family: Never    Attends Religious Services: Never    Database Administrator or Organizations: Yes    Attends Banker Meetings: Never    Marital Status: Never married  Intimate Partner Violence: Not At Risk (08/05/2024)   Epic    Fear of Current or Ex-Partner: No    Emotionally Abused: No    Physically Abused: No    Sexually Abused: No  Depression (PHQ2-9): Low Risk (09/08/2023)   Depression (PHQ2-9)    PHQ-2 Score: 0  Alcohol Screen: Low Risk (09/08/2023)   Alcohol Screen    Last Alcohol Screening Score (AUDIT): 0  Housing: Low Risk (08/05/2024)   Epic    Unable to Pay for Housing in the Last Year: No    Number of Times Moved in the Last Year: 0    Homeless in the Last Year: No  Utilities: Not At Risk (08/05/2024)   Epic    Threatened with loss of utilities: No  Health Literacy: Adequate Health Literacy (09/08/2023)   B1300 Health Literacy    Frequency of need for help with medical instructions: Never    Outpatient Medications Prior to Visit  Medication Sig Dispense Refill   albuterol  (PROVENTIL ) (2.5 MG/3ML) 0.083% nebulizer solution Take 3 mLs (2.5 mg total) by nebulization every 6 (six) hours as needed for wheezing or shortness of breath. 150 mL 2   atorvastatin  (LIPITOR) 20 MG tablet TAKE ONE TABLET BY MOUTH EVERY DAY. (WHITE OVAL TABLET WITH 114 OR MA 2, **GENERIC LIPITOR**) 31 tablet 3   citalopram  (CELEXA ) 40 MG tablet Take 1 tablet (40 mg total) by mouth every morning. 30 tablet 11   clonazePAM  (KLONOPIN ) 0.5 MG tablet Take 0.5 mg by mouth 3 (three) times daily. 1/2 qam, 1/2 q4 pm, 1/2 qhs     docusate sodium  (COLACE)  100 MG capsule Take 1 capsule (100 mg total) by mouth 2 (two) times daily as needed for mild constipation. 60 capsule 11   feeding supplement, ENSURE COMPLETE, (ENSURE COMPLETE) LIQD Take 237 mLs by mouth daily with breakfast. 7110 mL 11   loratadine  (CLARITIN ) 10 MG tablet Take 1 tablet (10 mg total) by mouth daily. 30 tablet 11   montelukast  (SINGULAIR ) 10 MG tablet Take 1 tablet (10 mg total) by mouth daily. 30 tablet 11   Multiple Vitamin (MULTIVITAMIN) tablet Take 1 tablet by mouth daily. 90 tablet 0   nystatin  cream (MYCOSTATIN ) Apply 1 Application topically 2 (two) times daily.SABRA LATUS MYCOSTATIN **) 30 g 3   polyethylene glycol (MIRALAX ) 17 g packet Take 17 g by  mouth 2 (two) times daily. 60 each 11   QUEtiapine  (SEROQUEL ) 200 MG tablet Take 200 mg by mouth 3 (three) times daily. 1.5 tab qam, 1.5 tab qpm, 1 tab qhs     sodium phosphate (FLEET) 7-19 GM/118ML ENEM Place 133 mLs (1 enema total) rectally daily as needed for severe constipation. Dose 4.5 OZ Q72 H PRN. 133 mL 6   Starch, Thickening, LIQD 3 tsps per 4 oz of liquid. Recommend 12 oz liquid (water,tea, or juice) three times a day. 237 mL 11   Wheat Dextrin (BENEFIBER ON THE GO) POWD One package daily 28 each 11   lactulose  (CHRONULAC ) 10 GM/15ML solution TAKE 45ML (30GM) BY MOUTH THREE TIMES DAILY 3784 mL 11   pantoprazole  (PROTONIX ) 40 MG tablet TAKE ONE TABLET BY MOUTH TWICE DAILY. (YELLOW OVAL TABLET WITH H126, **GENERIC PROTONIX **) 62 tablet 2   valsartan  (DIOVAN ) 40 MG tablet Take 1 tablet (40 mg total) by mouth daily. 30 tablet 5   azithromycin  (ZITHROMAX ) 250 MG tablet 2 DAILY FOR FIRST DAY, THEN DECREASE TO ONE DAILY FOR 4 MORE DAYS. 6 tablet 0   doxycycline  (VIBRA -TABS) 100 MG tablet Take 1 tablet (100 mg total) by mouth 2 (two) times daily for 5 days. 10 tablet 0   No facility-administered medications prior to visit.    Allergies[1]  Review of Systems  Constitutional:  Negative for appetite change, chills, fatigue  and fever.  HENT:  Negative for congestion, ear pain and sore throat.   Respiratory:  Positive for cough and shortness of breath.   Cardiovascular:  Negative for chest pain.  Gastrointestinal:  Positive for constipation. Negative for abdominal pain, diarrhea, nausea and vomiting.  Musculoskeletal:  Negative for arthralgias and myalgias.  Psychiatric/Behavioral:  Negative for dysphoric mood.        No dysphoria       Objective:        08/05/2024    9:41 AM 08/01/2024   10:38 AM 05/16/2024   10:58 AM  Vitals with BMI  Height 6' 1 6' 1 6' 1  Weight 173 lbs 171 lbs 6 oz 168 lbs  BMI 22.83 22.62 22.17  Systolic 124 118 889  Diastolic 70 66 74  Pulse 96 92 77    No data found.   Physical Exam Vitals reviewed.  Neck:     Vascular: No carotid bruit.  Cardiovascular:     Pulses: Normal pulses.     Heart sounds: Normal heart sounds.  Pulmonary:     Effort: Pulmonary effort is normal.     Breath sounds: Examination of the right-middle field reveals rales. Examination of the left-middle field reveals rales. Examination of the right-lower field reveals rhonchi and rales. Examination of the left-lower field reveals rhonchi and rales. Rhonchi and rales present. No wheezing.     Comments: Poor air exchange. Difficult to assess.  Abdominal:     General: Bowel sounds are normal.     Palpations: Abdomen is soft.     Tenderness: There is no abdominal tenderness.  Neurological:     Mental Status: [name] is alert.  Psychiatric:        Mood and Affect: Mood normal.        Behavior: Behavior normal.     Health Maintenance Due  Topic Date Due   Zoster Vaccines- Shingrix (1 of 2) Never done   COVID-19 Vaccine (3 - Mixed Product risk series) 10/06/2023   Colonoscopy  03/08/2024   Medicare Annual Wellness (AWV)  09/07/2024  There are no preventive care reminders to display for this patient.   Lab Results  Component Value Date   TSH 1.960 09/08/2023   Lab Results  Component Value  Date   WBC 12.2 (H) 05/10/2024   HGB 14.1 05/10/2024   HCT 42.9 05/10/2024   MCV 91 05/10/2024   PLT 331 05/10/2024   Lab Results  Component Value Date   NA 140 05/06/2024   K 4.4 05/06/2024   CO2 28 (A) 05/06/2024   GLUCOSE 129 (H) 01/22/2024   BUN 15 05/06/2024   CREATININE 0.7 05/06/2024   BILITOT 0.3 01/22/2024   ALKPHOS 99 05/06/2024   AST 36 05/06/2024   ALT 32 05/06/2024   PROT 6.3 01/22/2024   ALBUMIN 3.8 05/06/2024   CALCIUM  8.7 05/06/2024   EGFR 105 05/06/2024   Lab Results  Component Value Date   CHOL 123 07/14/2023   Lab Results  Component Value Date   HDL 30 (L) 07/14/2023   Lab Results  Component Value Date   LDLCALC 80 07/14/2023   Lab Results  Component Value Date   TRIG 60 07/14/2023   Lab Results  Component Value Date   CHOLHDL 4.1 07/14/2023   Lab Results  Component Value Date   HGBA1C 5.4 04/26/2024        Results for orders placed or performed in visit on 08/01/24  POC COVID-19   Collection Time: 08/01/24 12:26 PM  Result Value Ref Range   SARS Coronavirus 2 Ag Negative Negative  POCT Flu A & B Status   Collection Time: 08/01/24 12:26 PM  Result Value Ref Range   Influenza A, POC Negative Negative   Influenza B, POC Negative Negative     Assessment & Plan:   Assessment & Plan Chronic idiopathic constipation POSSIBLE BOWEL OBSTRUCTION FOUND ON CXR. NEEDS CT SCAN OF ABD.  SENT AMITIZA  TO ADD TO MEDICATIONS PRIOR TO RECEIVING XR RESULT. .   Orders:   lubiprostone  (AMITIZA ) 24 MCG capsule; Take 1 capsule (24 mcg total) by mouth 2 (two) times daily with a meal.  Bronchitis CONCERNED ABOUT POSSIBLE PNEUMONIA DESPITE TREATMENT. SENT DOXY AND CEFDINIR . SENT FOR CHEST XRAY. CAME BACK NO CARDIOPULMONARY INFECTIONS, BUT POSSIBLE BOWEL OBSTRUCTION. Orders:   cefdinir  (OMNICEF ) 300 MG capsule; Take 1 capsule (300 mg total) by mouth 2 (two) times daily.   doxycycline  (VIBRA -TABS) 100 MG tablet; Take 1 tablet (100 mg total) by mouth  2 (two) times daily for 10 days.   DG Chest 2 View; Future  History of aspiration pneumonia History of aspiration    Encounter for immunization  Orders:   Flu vaccine HIGH DOSE PF(Fluzone  Trivalent)     Body mass index is 22.82 kg/m..    Meds ordered this encounter  Medications   cefdinir  (OMNICEF ) 300 MG capsule    Sig: Take 1 capsule (300 mg total) by mouth 2 (two) times daily.    Dispense:  20 capsule    Refill:  0   lubiprostone  (AMITIZA ) 24 MCG capsule    Sig: Take 1 capsule (24 mcg total) by mouth 2 (two) times daily with a meal.    Dispense:  60 capsule    Refill:  2   doxycycline  (VIBRA -TABS) 100 MG tablet    Sig: Take 1 tablet (100 mg total) by mouth 2 (two) times daily for 10 days.    Dispense:  20 tablet    Refill:  0    Orders Placed This Encounter  Procedures   DG Chest  2 View   Flu vaccine HIGH DOSE PF(Fluzone  Trivalent)     Follow-up: No follow-ups on file.  An After Visit Summary was printed and given to the patient.  Abigail Free, MD [facility] [phone]     [1]  Allergies Allergen Reactions   Augmentin [Amoxicillin-Pot Clavulanate]    "

## 2024-08-05 NOTE — Telephone Encounter (Signed)
 Per Dr, Cox: patient is fine to take cefdinir . tell them I approved it. He is going to ED due to possible bowel obstruction anyways.   Harlene at [facility] made aware of provider recommendation  Copied from CRM [id number]. Topic: Clinical - Prescription Issue >> Aug 05, 2024 11:25 AM [name] wrote: Reason for CRM:   Pharmacy needs a call back or send another prescription to replace cefdinir  (OMNICEF ) 300 MG capsule. [name] is allergic to penicillin.  Please call her at [phone] - they close at 12:00 - please leave a message after 12:00 PM Thanks

## 2024-08-05 NOTE — Assessment & Plan Note (Signed)
 POSSIBLE BOWEL OBSTRUCTION FOUND ON CXR. NEEDS CT SCAN OF ABD.  SENT AMITIZA  TO ADD TO MEDICATIONS PRIOR TO RECEIVING XR RESULT. .   Orders:   lubiprostone  (AMITIZA ) 24 MCG capsule; Take 1 capsule (24 mcg total) by mouth 2 (two) times daily with a meal.

## 2024-08-08 ENCOUNTER — Telehealth: Payer: Self-pay | Admitting: Family Medicine

## 2024-08-08 ENCOUNTER — Telehealth: Payer: Self-pay

## 2024-08-08 NOTE — Transitions of Care (Post Inpatient/ED Visit) (Signed)
 "  08/08/2024  Name: [name] MRN: [id number] DOB: 01-31-1961  Today's TOC FU Call Status: Today's TOC FU Call Status:: Successful TOC FU Call Completed Unsuccessful Call (1st Attempt) Date: 08/08/24 [facility] FU Call Complete Date: 08/08/24  Patient's Name and Date of Birth confirmed. Name, DOB  Transition Care Management Follow-up Telephone Call Date of Discharge: 08/07/24 Discharge Facility: Other [facility]) Name of Other (Non[facility]) Discharge Facility: [facility] Type of Discharge: Inpatient Admission Primary Inpatient Discharge Diagnosis:: constipation How have you been since you were released from the hospital?: Better Any questions or concerns?: No  Items Reviewed: Did you receive and understand the discharge instructions provided?: Yes Medications obtained,verified, and reconciled?: Yes (Medications Reviewed) Any new allergies since your discharge?: No Dietary orders reviewed?: Yes Do you have support at home?: Yes People in Home [RPT]: facility resident  Medications Reviewed Today: Medications Reviewed Today     Reviewed by Emmitt Pan, LPN (Licensed Practical Nurse) on 08/08/24 at 1555  Med List Status: <None>   Medication Order Taking? Sig Documenting Provider Last Dose Status Informant  albuterol  (PROVENTIL ) (2.5 MG/3ML) 0.083% nebulizer solution [id number] Yes Take 3 mLs (2.5 mg total) by nebulization every 6 (six) hours as needed for wheezing or shortness of breath. Cox, Kirsten, MD  Active   atorvastatin  (LIPITOR) 20 MG tablet [id number] Yes TAKE ONE TABLET BY MOUTH EVERY DAY. (WHITE OVAL TABLET WITH 114 OR MA 2, **GENERIC LIPITOR**) Cox, Kirsten, MD  Active   azithromycin  (ZITHROMAX ) 250 MG tablet [id number] Yes 2 DAILY FOR FIRST DAY, THEN DECREASE TO ONE DAILY FOR 4 MORE DAYS. Teressa Harrie HERO, FNP  Active   cefdinir  (OMNICEF ) 300 MG capsule [id number] Yes Take 1 capsule (300 mg total) by mouth 2 (two) times daily. Cox, Kirsten, MD  Active   citalopram   (CELEXA ) 40 MG tablet [id number] Yes Take 1 tablet (40 mg total) by mouth every morning. Cox, Kirsten, MD  Active   clonazePAM  (KLONOPIN ) 0.5 MG tablet [id number] Yes Take 0.5 mg by mouth 3 (three) times daily. 1/2 qam, 1/2 q4 pm, 1/2 qhs [provider]  Active   docusate sodium  (COLACE) 100 MG capsule [id number] Yes Take 1 capsule (100 mg total) by mouth 2 (two) times daily as needed for mild constipation. Cox, Kirsten, MD  Active   doxycycline  (VIBRA -TABS) 100 MG tablet [id number] Yes Take 1 tablet (100 mg total) by mouth 2 (two) times daily for 10 days. Cox, Kirsten, MD  Active   feeding supplement, ENSURE COMPLETE, (ENSURE COMPLETE) LIQD [id number] Yes Take 237 mLs by mouth daily with breakfast. Cox, Kirsten, MD  Active   lactulose  (CHRONULAC ) 10 GM/15ML solution [id number] Yes TAKE (30GM) BY MOUTH THREE TIMES DAILY (GENERIC Constulose ) Cox, Kirsten, MD  Active   loratadine  (CLARITIN ) 10 MG tablet [id number] Yes Take 1 tablet (10 mg total) by mouth daily. Cox, Kirsten, MD  Active   lubiprostone  (AMITIZA ) 24 MCG capsule [id number] Yes Take 1 capsule (24 mcg total) by mouth 2 (two) times daily with a meal. Cox, Kirsten, MD  Active   montelukast  (SINGULAIR ) 10 MG tablet [id number] Yes Take 1 tablet (10 mg total) by mouth daily. Cox, Kirsten, MD  Active   Multiple Vitamin (MULTIVITAMIN) tablet [id number] Yes Take 1 tablet by mouth daily. CoxAbigail, MD  Active   nystatin  cream (MYCOSTATIN ) [id number] Yes Apply 1 Application topically 2 (two) times daily.SABRA HAMMONDGENERIC MYCOSTATIN **) Cox, Kirsten, MD  Active   pantoprazole  (PROTONIX ) 40 MG tablet [id number] Yes TAKE ONE TABLET BY MOUTH TWICE  DAILY. (YELLOW OVAL TABLET WITH H126, **GENERIC PROTONIX **) Cox, Kirsten, MD  Active   polyethylene glycol (MIRALAX ) 17 g packet [id number] Yes Take 17 g by mouth 2 (two) times daily. Cox, Kirsten, MD  Active   QUEtiapine  (SEROQUEL ) 200 MG tablet [id number] Yes Take 200 mg by mouth 3 (three) times daily. 1.5 tab qam,  1.5 tab qpm, 1 tab qhs [provider]  Active   sodium phosphate (FLEET) 7-19 GM/118ML ENEM [id number] Yes Place 133 mLs (1 enema total) rectally daily as needed for severe constipation. Dose 4.5 OZ Q72 H PRN. Abran Jerilynn Loving, MD  Active   Starch, Thickening, BERNICE [id number] Yes 3 tsps per 4 oz of liquid. Recommend 12 oz liquid (water,tea, or juice) three times a day. Cox, Abigail, MD  Active   valsartan  (DIOVAN ) 40 MG tablet [id number] Yes TAKE ONE TABLET BY MOUTH EVERY DAY. (YELLOW OVAL TABLET WITH HH 3 41 OR L 12, **GENERIC DIOVAN **) Cox, Kirsten, MD  Active   Wheat Dextrin (BENEFIBER ON THE GO) POWD [id number] Yes One package daily Cox, Abigail, MD  Active             Home Care and Equipment/Supplies: Were Home Health Services Ordered?: NA Any new equipment or medical supplies ordered?: NA  Functional Questionnaire: Do you need assistance with bathing/showering or dressing?: No Do you need assistance with meal preparation?: No Do you need assistance with eating?: No Do you have difficulty maintaining continence: No Do you need assistance with getting out of bed/getting out of a chair/moving?: No Do you have difficulty managing or taking your medications?: No  Follow up appointments reviewed: PCP Follow-up appointment confirmed?: Yes Date of PCP follow-up appointment?: 08/11/24 Follow-up Provider: [facility] Follow-up appointment confirmed?: NA Do you need transportation to your follow-up appointment?: No Do you understand care options if your condition(s) worsen?: Yes-patient verbalized understanding    SIGNATURE Julian Lemmings, LPN [facility] Nurse Health Advisor Direct Dial [phone]  "

## 2024-08-08 NOTE — Telephone Encounter (Signed)
 Copied from CRM #[id number]. Topic: Appointments - Scheduling Inquiry for Clinic >> Aug 08, 2024  9:50 AM Terri MATSU wrote: Reason for CRM: Vina from [facility] provider calling regarding patients ED appointment follow up. She stated patient needs to be seen by pcp in 7days but Dr.Cox doesn't have anything til Feb. They were looking for something for Thursday after 12. Callback number [phone]

## 2024-08-08 NOTE — Telephone Encounter (Signed)
 Appointment has been scheduled for Thursday Jan 15th.

## 2024-08-08 NOTE — Transitions of Care (Post Inpatient/ED Visit) (Signed)
" ° °  08/08/2024  Name: [name] MRN: [id number] DOB: 07-12-1961  Today's TOC FU Call Status: Today's TOC FU Call Status:: Unsuccessful Call (1st Attempt) Unsuccessful Call (1st Attempt) Date: 08/08/24  Attempted to reach the patient regarding the most recent Inpatient/ED visit.  Follow Up Plan: Additional outreach attempts will be made to reach the patient to complete the Transitions of Care (Post Inpatient/ED visit) call.   Signature Julian Lemmings, LPN [facility] Nurse Health Advisor Direct Dial [phone]  "

## 2024-08-11 ENCOUNTER — Encounter: Payer: Self-pay | Admitting: Family Medicine

## 2024-08-11 ENCOUNTER — Ambulatory Visit: Admitting: Family Medicine

## 2024-08-11 VITALS — BP 120/80 | HR 65 | Temp 97.8°F | Resp 16 | Ht 73.0 in | Wt 173.0 lb

## 2024-08-11 DIAGNOSIS — K5904 Chronic idiopathic constipation: Secondary | ICD-10-CM

## 2024-08-11 DIAGNOSIS — J101 Influenza due to other identified influenza virus with other respiratory manifestations: Secondary | ICD-10-CM | POA: Insufficient documentation

## 2024-08-11 NOTE — Assessment & Plan Note (Signed)
 Constipation with possible bowel obstruction CT scan indicates possible internal hernia, volvulus, or mild rotation with bowel dilatation and fecal loading. Rectal contrast study recommended. - Track bowel movements for at least one per day. - Consult with Doctor Cox regarding rectal contrast study. - Monitor for signs of bowel obstruction.

## 2024-08-11 NOTE — Progress Notes (Signed)
 "  Subjective:  Patient ID: [name], male    DOB: [birth year]  Age: 64 y.o. MRN: [id number]  Chief Complaint  Patient presents with   Hospitalization Follow-up    Discussed the use of AI scribe software for clinical note transcription with the patient, who gave verbal consent to proceed.  History of Present Illness   [name] is a 64 year old male who presents for a hospital follow-up after recent discharge.  Respiratory symptoms - Recent hospitalization for congestion and cough - Initially treated with Tamiflu, which was discontinued - Persistent congestion and cough at discharge - Started doxycycline  100 mg twice daily for ten days on Monday after discharge - Improvement in respiratory symptoms since starting antibiotics - History of frequent colds and congestion - History of pneumonia - Caregiver concerned about increased frequency of respiratory illnesses  Gastrointestinal symptoms and bowel function - CT scan during hospitalization showed clumping and twisting of large bowel loops in the right hypochondriac region - History of bowel blockage  - Three bowel movements over four days since discharge - Caregiver providing prune juice and apple juice to aid regularity - Amitiza  recently prescribed for bowel movements - Caregiver closely monitoring bowel movements due to history of bowel issues          09/08/2023   11:04 AM 03/26/2020    1:59 PM  Depression screen PHQ 2/9  Decreased Interest 0 0  Down, Depressed, Hopeless 0 0  PHQ - 2 Score 0 0        09/08/2023   11:04 AM  Fall Risk   Falls in the past year? 0  Number falls in past yr: 0  Injury with Fall? 0   Risk for fall due to : No Fall Risks  Follow up Falls evaluation completed     Data saved with a previous flowsheet row definition    Patient Care Team: Sherre Clapper, MD as PCP - General (Family Medicine) Gretel Ozell PARAS, DPM (Inactive) as Consulting Physician (Podiatry)    Review of Systems  Constitutional:  Negative for appetite change, fatigue and fever.  HENT:  Negative for congestion, ear pain, sinus pressure and sore throat.   Eyes: Negative.   Respiratory:  Negative for cough, chest tightness, shortness of breath and wheezing.   Cardiovascular:  Negative for chest pain and palpitations.  Gastrointestinal:  Positive for constipation. Negative for abdominal pain, diarrhea, nausea and vomiting.  Endocrine: Negative.   Genitourinary:  Negative for dysuria and hematuria.  Musculoskeletal:  Negative for arthralgias, back pain, joint swelling and myalgias.  Skin:  Negative for rash.  Allergic/Immunologic: Negative.   Neurological:  Negative for dizziness, weakness, light-headedness and headaches.  Hematological: Negative.   Psychiatric/Behavioral:  Negative for dysphoric mood. The patient is not nervous/anxious.     Medications Ordered Prior to Encounter[1] Past Medical History:  Diagnosis Date   Acute bronchitis due to other specified organisms 12/23/2021   Autism    BMI 30.0-30.9,adult 09/26/2019   Chronic idiopathic constipation 06/01/2020   Depression, major, recurrent, mild 09/26/2019   Developmental non-verbal disorder    Encounter for Medicare annual wellness exam 09/01/2022   Encounter for removal of sutures 03/28/2022   Essential hypertension    Fall on same level 03/28/2022   Gastroesophageal reflux disease without esophagitis 09/26/2019   Hypotension 11/20/2021   Laceration of occipital region of scalp 03/28/2022   Mixed hyperlipidemia    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   Pedal edema 04/10/2021   Prediabetes  Primary hypertension    Reactive airway disease 12/23/2021   Seizure disorder (HCC)    Severe intellectual disabilities    Thyroid  nodule 11/01/2019   Urinary incontinence due to cognitive impairment 04/30/2021   Past Surgical History:  Procedure Laterality Date   LAPAROTOMY  2021    Family History  Family history  unknown: Yes   Social History   Socioeconomic History   Marital status: Single    Spouse name: Not on file   Number of children: Not on file   Years of education: Not on file   Highest education level: Not on file  Occupational History   Not on file  Tobacco Use   Smoking status: Never   Smokeless tobacco: Never  Substance and Sexual Activity   Alcohol use: Never   Drug use: Never   Sexual activity: Not on file  Other Topics Concern   Not on file  Social History Narrative   ** Merged History Encounter **       Social Drivers of Health   Tobacco Use: Low Risk (08/11/2024)   Patient History    Smoking Tobacco Use: Never    Smokeless Tobacco Use: Never    Passive Exposure: Not on file  Financial Resource Strain: Low Risk (09/08/2023)   Overall Financial Resource Strain (CARDIA)    Difficulty of Paying Living Expenses: Not hard at all  Food Insecurity: No Food Insecurity (08/05/2024)   Epic    Worried About Radiation Protection Practitioner of Food in the Last Year: Never true    Ran Out of Food in the Last Year: Never true  Transportation Needs: No Transportation Needs (08/05/2024)   Epic    Lack of Transportation (Medical): No    Lack of Transportation (Non-Medical): No  Physical Activity: Inactive (09/08/2023)   Exercise Vital Sign    Days of Exercise per Week: 0 days    Minutes of Exercise per Session: 0 min  Stress: No Stress Concern Present (09/08/2023)   [employer] of Occupational Health - Occupational Stress Questionnaire    Feeling of Stress : Not at all  Social Connections: Socially Isolated (09/08/2023)   Social Connection and Isolation Panel    Frequency of Communication with Friends and Family: Never    Frequency of Social Gatherings with Friends and Family: Never    Attends Religious Services: Never    Database Administrator or Organizations: Yes    Attends Banker Meetings: Never    Marital Status: Never married  Depression (PHQ2-9): Low Risk (09/08/2023)    Depression (PHQ2-9)    PHQ-2 Score: 0  Alcohol Screen: Low Risk (09/08/2023)   Alcohol Screen    Last Alcohol Screening Score (AUDIT): 0  Housing: Low Risk (08/05/2024)   Epic    Unable to Pay for Housing in the Last Year: No    Number of Times Moved in the Last Year: 0    Homeless in the Last Year: No  Utilities: Not At Risk (08/05/2024)   Epic    Threatened with loss of utilities: No  Health Literacy: Adequate Health Literacy (09/08/2023)   B1300 Health Literacy    Frequency of need for help with medical instructions: Never    Objective:  BP 120/80   Pulse 65   Temp 97.8 F (36.6 C) (Temporal)   Resp 16   Ht 6' 1 (1.854 m)   Wt 173 lb (78.5 kg)   SpO2 96%   BMI 22.82 kg/m      08/11/2024  2:14 PM 08/05/2024    9:41 AM 08/01/2024   10:38 AM  BP/Weight  Systolic BP 120 124 118  Diastolic BP 80 70 66  Wt. (Lbs) 173 173 171.4  BMI 22.82 kg/m2 22.82 kg/m2 22.61 kg/m2    Physical Exam Vitals reviewed.  Constitutional:      General: He is not in acute distress.    Appearance: Normal appearance. He is not ill-appearing.  Eyes:     Conjunctiva/sclera: Conjunctivae normal.  Cardiovascular:     Rate and Rhythm: Normal rate and regular rhythm.     Heart sounds: Normal heart sounds. No murmur heard. Pulmonary:     Effort: Pulmonary effort is normal. No respiratory distress.     Breath sounds: Normal breath sounds.  Abdominal:     General: Bowel sounds are normal.     Palpations: Abdomen is soft.     Tenderness: There is no abdominal tenderness.  Musculoskeletal:        General: Normal range of motion.  Skin:    General: Skin is warm.  Neurological:     Mental Status: He is alert and oriented to person, place, and time. Mental status is at baseline.  Psychiatric:        Mood and Affect: Mood normal.        Behavior: Behavior normal.      Lab Results  Component Value Date   WBC 12.2 (H) 05/10/2024   HGB 14.1 05/10/2024   HCT 42.9 05/10/2024   PLT 331 05/10/2024    GLUCOSE 129 (H) 01/22/2024   CHOL 123 07/14/2023   TRIG 60 07/14/2023   HDL 30 (L) 07/14/2023   LDLCALC 80 07/14/2023   ALT 32 05/06/2024   AST 36 05/06/2024   NA 140 05/06/2024   K 4.4 05/06/2024   CL 104 05/06/2024   CREATININE 0.7 05/06/2024   BUN 15 05/06/2024   CO2 28 (A) 05/06/2024   TSH 1.960 09/08/2023   INR 1.1 12/01/2008   HGBA1C 5.4 04/26/2024    Results for orders placed or performed in visit on 08/01/24  POC COVID-19   Collection Time: 08/01/24 12:26 PM  Result Value Ref Range   SARS Coronavirus 2 Ag Negative Negative  POCT Flu A & B Status   Collection Time: 08/01/24 12:26 PM  Result Value Ref Range   Influenza A, POC Negative Negative   Influenza B, POC Negative Negative  .  Assessment & Plan:   Assessment & Plan Chronic idiopathic constipation Constipation with possible bowel obstruction CT scan indicates possible internal hernia, volvulus, or mild rotation with bowel dilatation and fecal loading. Rectal contrast study recommended. - Track bowel movements for at least one per day. - Consult with Doctor [facility] regarding rectal contrast study. - Monitor for signs of bowel obstruction.    Influenza A with respiratory manifestations Recent hospitalization for congestion and cough due to influenza A. Discharged with clear chest xray and recommendation to not start antibiotics ordered. Improvement with doxycycline  post-discharge. - Continue doxycycline  100 mg twice daily for ten days. - Monitor respiratory symptoms and ensure completion of antibiotic course       Follow-up: Return in about 3 months (around 11/09/2024) for chronic, lab visit.  An After Visit Summary was printed and given to the patient.  Harrie Cedar, FNP [facility] [phone]       [1]  Current Outpatient Medications on File Prior to Visit  Medication Sig Dispense Refill   albuterol  (PROVENTIL ) (2.5 MG/3ML) 0.083% nebulizer solution  Take 3 mLs (2.5 mg total) by  nebulization every 6 (six) hours as needed for wheezing or shortness of breath. 150 mL 2   atorvastatin  (LIPITOR) 20 MG tablet TAKE ONE TABLET BY MOUTH EVERY DAY. (WHITE OVAL TABLET WITH 114 OR MA 2, **GENERIC LIPITOR**) 31 tablet 3   azithromycin  (ZITHROMAX ) 250 MG tablet 2 DAILY FOR FIRST DAY, THEN DECREASE TO ONE DAILY FOR 4 MORE DAYS. 6 tablet 0   cefdinir  (OMNICEF ) 300 MG capsule Take 1 capsule (300 mg total) by mouth 2 (two) times daily. 20 capsule 0   citalopram  (CELEXA ) 40 MG tablet Take 1 tablet (40 mg total) by mouth every morning. 30 tablet 11   clonazePAM  (KLONOPIN ) 0.5 MG tablet Take 0.5 mg by mouth 3 (three) times daily. 1/2 qam, 1/2 q4 pm, 1/2 qhs     docusate sodium  (COLACE) 100 MG capsule Take 1 capsule (100 mg total) by mouth 2 (two) times daily as needed for mild constipation. 60 capsule 11   doxycycline  (VIBRA -TABS) 100 MG tablet Take 1 tablet (100 mg total) by mouth 2 (two) times daily for 10 days. 20 tablet 0   feeding supplement, ENSURE COMPLETE, (ENSURE COMPLETE) LIQD Take 237 mLs by mouth daily with breakfast. 7110 mL 11   lactulose  (CHRONULAC ) 10 GM/15ML solution TAKE 45ML (30GM) BY MOUTH THREE TIMES DAILY (GENERIC Constulose ) 3784 mL 5   loratadine  (CLARITIN ) 10 MG tablet Take 1 tablet (10 mg total) by mouth daily. 30 tablet 11   lubiprostone  (AMITIZA ) 24 MCG capsule Take 1 capsule (24 mcg total) by mouth 2 (two) times daily with a meal. 60 capsule 2   montelukast  (SINGULAIR ) 10 MG tablet Take 1 tablet (10 mg total) by mouth daily. 30 tablet 11   Multiple Vitamin (MULTIVITAMIN) tablet Take 1 tablet by mouth daily. 90 tablet 0   nystatin  cream (MYCOSTATIN ) Apply 1 Application topically 2 (two) times daily.SABRA LATUS MYCOSTATIN **) 30 g 3   pantoprazole  (PROTONIX ) 40 MG tablet TAKE ONE TABLET BY MOUTH TWICE DAILY. (YELLOW OVAL TABLET WITH H126, **GENERIC PROTONIX **) 62 tablet 2   polyethylene glycol (MIRALAX ) 17 g packet Take 17 g by mouth 2 (two) times daily. 60 each 11    QUEtiapine  (SEROQUEL ) 200 MG tablet Take 200 mg by mouth 3 (three) times daily. 1.5 tab qam, 1.5 tab qpm, 1 tab qhs     sodium phosphate (FLEET) 7-19 GM/118ML ENEM Place 133 mLs (1 enema total) rectally daily as needed for severe constipation. Dose 4.5 OZ Q72 H PRN. 133 mL 6   Starch, Thickening, LIQD 3 tsps per 4 oz of liquid. Recommend 12 oz liquid (water,tea, or juice) three times a day. 237 mL 11   valsartan  (DIOVAN ) 40 MG tablet TAKE ONE TABLET BY MOUTH EVERY DAY. (YELLOW OVAL TABLET WITH HH 3 41 OR L 12, **GENERIC DIOVAN **) 31 tablet 5   Wheat Dextrin (BENEFIBER ON THE GO) POWD One package daily 28 each 11   No current facility-administered medications on file prior to visit.   "

## 2024-08-11 NOTE — Assessment & Plan Note (Signed)
 Recent hospitalization for congestion and cough due to influenza A. Discharged with clear chest xray and recommendation to not start antibiotics ordered. Improvement with doxycycline  post-discharge. - Continue doxycycline  100 mg twice daily for ten days. - Monitor respiratory symptoms and ensure completion of antibiotic course

## 2024-09-01 ENCOUNTER — Ambulatory Visit: Admitting: Family Medicine

## 2024-09-01 ENCOUNTER — Encounter: Payer: Self-pay | Admitting: Family Medicine

## 2024-09-01 ENCOUNTER — Ambulatory Visit: Payer: Self-pay

## 2024-09-01 ENCOUNTER — Ambulatory Visit (INDEPENDENT_AMBULATORY_CARE_PROVIDER_SITE_OTHER)
Admission: RE | Admit: 2024-09-01 | Discharge: 2024-09-01 | Disposition: A | Source: Ambulatory Visit | Attending: Family Medicine | Admitting: Family Medicine

## 2024-09-01 VITALS — BP 114/72 | HR 83 | Temp 97.6°F | Resp 18 | Ht 73.0 in | Wt 169.6 lb

## 2024-09-01 DIAGNOSIS — Z8701 Personal history of pneumonia (recurrent): Secondary | ICD-10-CM | POA: Insufficient documentation

## 2024-09-01 DIAGNOSIS — R6889 Other general symptoms and signs: Secondary | ICD-10-CM

## 2024-09-01 DIAGNOSIS — J069 Acute upper respiratory infection, unspecified: Secondary | ICD-10-CM

## 2024-09-01 LAB — POC INFLUENZA A&B (BINAX/QUICKVUE)
Influenza A, POC: NEGATIVE
Influenza B, POC: NEGATIVE

## 2024-09-01 LAB — POC COVID19 BINAXNOW: SARS Coronavirus 2 Ag: NEGATIVE

## 2024-09-01 MED ORDER — AZITHROMYCIN 250 MG PO TABS: ORAL_TABLET | ORAL | 0 refills | Status: AC

## 2024-09-01 NOTE — Telephone Encounter (Signed)
 Caregiver Donnice called NT reporting the pt spiked a fever yesterday and now has congestion, wheezing, dry cough. She is concerned for pneumonia. Appt for today scheduled.   FYI Only or Action Required?: FYI only for provider: appointment scheduled on 2/5.  Patient was last seen in primary care on 08/11/2024 by Teressa Harrie HERO, FNP.  Called Nurse Triage reporting Fever, Cough, and Wheezing.  Symptoms began yesterday.  Interventions attempted: OTC medications: tylenol.  Symptoms are: gradually worsening.  Triage Disposition: See HCP Within 4 Hours (Or PCP Triage)  Patient/caregiver understands and will follow disposition?: Yes      Reason for Triage: Lonell called on behalf of pt to be seen for pneumonia symptoms  fever of 102. , extremely congested, wheezing/ heavy breathing   Reason for Disposition  Wheezing is present  Answer Assessment - Initial Assessment Questions 1. ONSET: When did the symptoms begin?      Yesterday   2. SEVERITY: How bad is the cough today?      Constant cough  3. SPUTUM: Describe the color of your sputum (e.g., none, dry cough; clear, white, yellow, green)     Dry cough   5. DIFFICULTY BREATHING: Are you having difficulty breathing? If Yes, ask: How bad is it? (e.g., mild, moderate, severe)      Um, no more than when he coughs a lot per Tunisha  6. FEVER: Do you have a fever? If Yes, ask: What is your temperature, how was it measured, and when did it start?     102F   10. OTHER SYMPTOMS: Do you have any other symptoms? (e.g., runny nose, wheezing, chest pain) Wheezing, fever, congtestion  Protocols used: Cough - Acute Productive-A-AH

## 2024-09-01 NOTE — Progress Notes (Signed)
 "  Acute Office Visit  Subjective:    Patient ID: [name], male    DOB: 12/19/1960, 64 y.o.   MRN: [id number]  Chief Complaint  Patient presents with   Nasal Congestion    Chest congestion    Discussed the use of AI scribe software for clinical note transcription with the patient, who gave verbal consent to proceed.  History of Present Illness   [name] is a 64 year old male who presents with recurrent pneumonia and respiratory symptoms.  Recurrent pneumonia and respiratory symptoms - Multiple episodes of pneumonia, most recently in October 2025 - No recent exposure to sick individuals - Fever and upper respiratory symptoms serve as indicators of illness - Previously treated with Z-Pack, prednisone and Levaquin . - Prednisone caused significant side effects including hyperactivity and insomnia, described as 'like a crack fool' with 'eyes popping out of his head' - Prefers to avoid prednisone in future treatments  Dysphagia and choking - Denies difficulty swallowing, especially with solid foods - Requires meals to be thickened or pureed to prevent choking - Tends to eat quickly, which can lead to choking episodes   Past Medical History:  Diagnosis Date   Acute bronchitis due to other specified organisms 12/23/2021   Autism    BMI 30.0-30.9,adult 09/26/2019   Chronic idiopathic constipation 06/01/2020   Depression, major, recurrent, mild 09/26/2019   Developmental non-verbal disorder    Encounter for Medicare annual wellness exam 09/01/2022   Encounter for removal of sutures 03/28/2022   Essential hypertension    Fall on same level 03/28/2022   Gastroesophageal reflux disease without esophagitis 09/26/2019   Hypotension 11/20/2021   Laceration of occipital region of scalp 03/28/2022   Mixed hyperlipidemia    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   Pedal edema 04/10/2021   Prediabetes    Primary hypertension    Reactive airway disease 12/23/2021    Seizure disorder (HCC)    Severe intellectual disabilities    Thyroid  nodule 11/01/2019   Urinary incontinence due to cognitive impairment 04/30/2021    Past Surgical History:  Procedure Laterality Date   LAPAROTOMY  2021    Family History  Family history unknown: Yes    Social History   Socioeconomic History   Marital status: Single    Spouse name: Not on file   Number of children: Not on file   Years of education: Not on file   Highest education level: Not on file  Occupational History   Not on file  Tobacco Use   Smoking status: Never   Smokeless tobacco: Never  Substance and Sexual Activity   Alcohol use: Never   Drug use: Never   Sexual activity: Not on file  Other Topics Concern   Not on file  Social History Narrative   ** Merged History Encounter **       Social Drivers of Health   Tobacco Use: Low Risk (09/01/2024)   Patient History    Smoking Tobacco Use: Never    Smokeless Tobacco Use: Never    Passive Exposure: Not on file  Financial Resource Strain: Low Risk (09/08/2023)   Overall Financial Resource Strain (CARDIA)    Difficulty of Paying Living Expenses: Not hard at all  Food Insecurity: No Food Insecurity (08/05/2024)   Epic    Worried About Radiation Protection Practitioner of Food in the Last Year: Never true    Ran Out of Food in the Last Year: Never true  Transportation Needs: No Transportation Needs (08/05/2024)  Epic    Lack of Transportation (Medical): No    Lack of Transportation (Non-Medical): No  Physical Activity: Inactive (09/08/2023)   Exercise Vital Sign    Days of Exercise per Week: 0 days    Minutes of Exercise per Session: 0 min  Stress: No Stress Concern Present (09/08/2023)   [employer] of Occupational Health - Occupational Stress Questionnaire    Feeling of Stress : Not at all  Social Connections: Socially Isolated (09/08/2023)   Social Connection and Isolation Panel    Frequency of Communication with Friends and Family: Never    Frequency  of Social Gatherings with Friends and Family: Never    Attends Religious Services: Never    Database Administrator or Organizations: Yes    Attends Banker Meetings: Never    Marital Status: Never married  Intimate Partner Violence: Not At Risk (08/05/2024)   Epic    Fear of Current or Ex-Partner: No    Emotionally Abused: No    Physically Abused: No    Sexually Abused: No  Depression (PHQ2-9): Low Risk (09/08/2023)   Depression (PHQ2-9)    PHQ-2 Score: 0  Alcohol Screen: Low Risk (09/08/2023)   Alcohol Screen    Last Alcohol Screening Score (AUDIT): 0  Housing: Low Risk (08/05/2024)   Epic    Unable to Pay for Housing in the Last Year: No    Number of Times Moved in the Last Year: 0    Homeless in the Last Year: No  Utilities: Not At Risk (08/05/2024)   Epic    Threatened with loss of utilities: No  Health Literacy: Adequate Health Literacy (09/08/2023)   B1300 Health Literacy    Frequency of need for help with medical instructions: Never    Outpatient Medications Prior to Visit  Medication Sig Dispense Refill   albuterol  (PROVENTIL ) (2.5 MG/3ML) 0.083% nebulizer solution Take 3 mLs (2.5 mg total) by nebulization every 6 (six) hours as needed for wheezing or shortness of breath. 150 mL 2   atorvastatin  (LIPITOR) 20 MG tablet TAKE ONE TABLET BY MOUTH EVERY DAY. (WHITE OVAL TABLET WITH 114 OR MA 2, **GENERIC LIPITOR**) 31 tablet 3   cefdinir  (OMNICEF ) 300 MG capsule Take 1 capsule (300 mg total) by mouth 2 (two) times daily. 20 capsule 0   citalopram  (CELEXA ) 40 MG tablet Take 1 tablet (40 mg total) by mouth every morning. 30 tablet 11   clonazePAM  (KLONOPIN ) 0.5 MG tablet Take 0.5 mg by mouth 3 (three) times daily. 1/2 qam, 1/2 q4 pm, 1/2 qhs     docusate sodium  (COLACE) 100 MG capsule Take 1 capsule (100 mg total) by mouth 2 (two) times daily as needed for mild constipation. 60 capsule 11   feeding supplement, ENSURE COMPLETE, (ENSURE COMPLETE) LIQD Take 237 mLs by mouth  daily with breakfast. 7110 mL 11   lactulose  (CHRONULAC ) 10 GM/15ML solution TAKE 45ML (30GM) BY MOUTH THREE TIMES DAILY (GENERIC Constulose ) 3784 mL 5   loratadine  (CLARITIN ) 10 MG tablet Take 1 tablet (10 mg total) by mouth daily. 30 tablet 11   lubiprostone  (AMITIZA ) 24 MCG capsule Take 1 capsule (24 mcg total) by mouth 2 (two) times daily with a meal. 60 capsule 2   montelukast  (SINGULAIR ) 10 MG tablet Take 1 tablet (10 mg total) by mouth daily. 30 tablet 11   Multiple Vitamin (MULTIVITAMIN) tablet Take 1 tablet by mouth daily. 90 tablet 0   nystatin  cream (MYCOSTATIN ) Apply 1 Application topically 2 (two) times  daily.SABRA LATUS MYCOSTATIN **) 30 g 3   pantoprazole  (PROTONIX ) 40 MG tablet TAKE ONE TABLET BY MOUTH TWICE DAILY. (YELLOW OVAL TABLET WITH H126, **GENERIC PROTONIX **) 62 tablet 2   polyethylene glycol (MIRALAX ) 17 g packet Take 17 g by mouth 2 (two) times daily. 60 each 11   QUEtiapine  (SEROQUEL ) 200 MG tablet Take 200 mg by mouth 3 (three) times daily. 1.5 tab qam, 1.5 tab qpm, 1 tab qhs     sodium phosphate (FLEET) 7-19 GM/118ML ENEM Place 133 mLs (1 enema total) rectally daily as needed for severe constipation. Dose 4.5 OZ Q72 H PRN. 133 mL 6   Starch, Thickening, LIQD 3 tsps per 4 oz of liquid. Recommend 12 oz liquid (water,tea, or juice) three times a day. 237 mL 11   valsartan  (DIOVAN ) 40 MG tablet TAKE ONE TABLET BY MOUTH EVERY DAY. (YELLOW OVAL TABLET WITH HH 3 41 OR L 12, **GENERIC DIOVAN **) 31 tablet 5   Wheat Dextrin (BENEFIBER ON THE GO) POWD One package daily 28 each 11   azithromycin  (ZITHROMAX ) 250 MG tablet 2 DAILY FOR FIRST DAY, THEN DECREASE TO ONE DAILY FOR 4 MORE DAYS. 6 tablet 0   No facility-administered medications prior to visit.    Allergies[1]  Review of Systems  Constitutional:  Negative for appetite change, fatigue and fever.  HENT:  Positive for congestion. Negative for ear pain, sinus pressure and sore throat.   Respiratory:  Positive for cough,  shortness of breath and wheezing. Negative for chest tightness.   Cardiovascular:  Negative for chest pain and palpitations.  Gastrointestinal:  Negative for abdominal pain, constipation, diarrhea, nausea and vomiting.  Genitourinary:  Negative for dysuria and hematuria.  Musculoskeletal:  Negative for arthralgias, back pain, joint swelling and myalgias.  Skin:  Negative for rash.  Neurological:  Negative for dizziness, weakness and headaches.  Psychiatric/Behavioral:  Negative for dysphoric mood. The patient is not nervous/anxious.        Objective:        09/01/2024    1:27 PM 08/11/2024    2:14 PM 08/05/2024    9:41 AM  Vitals with BMI  Height 6' 1 6' 1 6' 1  Weight 169 lbs 10 oz 173 lbs 173 lbs  BMI 22.38 22.83 22.83  Systolic 114 120 875  Diastolic 72 80 70  Pulse 83 65 96    No data found.   Physical Exam Vitals reviewed.  Constitutional:      General: He is not in acute distress.    Appearance: Normal appearance. He is ill-appearing.  HENT:     Right Ear: Tympanic membrane normal.     Left Ear: Tympanic membrane normal.     Nose: Congestion present. No rhinorrhea.  Eyes:     Conjunctiva/sclera: Conjunctivae normal.  Cardiovascular:     Rate and Rhythm: Normal rate and regular rhythm.     Heart sounds: Normal heart sounds. No murmur heard. Pulmonary:     Effort: Pulmonary effort is normal. No respiratory distress.     Comments: Unable to take a deep breath Neurological:     Mental Status: He is alert and oriented to person, place, and time.  Psychiatric:        Mood and Affect: Mood normal.        Behavior: Behavior normal.     Health Maintenance Due  Topic Date Due   Zoster Vaccines- Shingrix (1 of 2) Never done   COVID-19 Vaccine (3 - Mixed Product risk series) 10/06/2023  Colonoscopy  03/08/2024   Medicare Annual Wellness (AWV)  09/07/2024    There are no preventive care reminders to display for this patient.   Lab Results  Component Value  Date   TSH 1.960 09/08/2023   Lab Results  Component Value Date   WBC 12.2 (H) 05/10/2024   HGB 14.1 05/10/2024   HCT 42.9 05/10/2024   MCV 91 05/10/2024   PLT 331 05/10/2024   Lab Results  Component Value Date   NA 140 05/06/2024   K 4.4 05/06/2024   CO2 28 (A) 05/06/2024   GLUCOSE 129 (H) 01/22/2024   BUN 15 05/06/2024   CREATININE 0.7 05/06/2024   BILITOT 0.3 01/22/2024   ALKPHOS 99 05/06/2024   AST 36 05/06/2024   ALT 32 05/06/2024   PROT 6.3 01/22/2024   ALBUMIN 3.8 05/06/2024   CALCIUM  8.7 05/06/2024   EGFR 105 05/06/2024   Lab Results  Component Value Date   CHOL 123 07/14/2023   Lab Results  Component Value Date   HDL 30 (L) 07/14/2023   Lab Results  Component Value Date   LDLCALC 80 07/14/2023   Lab Results  Component Value Date   TRIG 60 07/14/2023   Lab Results  Component Value Date   CHOLHDL 4.1 07/14/2023   Lab Results  Component Value Date   HGBA1C 5.4 04/26/2024        Results for orders placed or performed in visit on 09/01/24  POC COVID-19   Collection Time: 09/01/24  2:27 PM  Result Value Ref Range   SARS Coronavirus 2 Ag Negative Negative  POC Influenza A&B (Binax test)   Collection Time: 09/01/24  2:27 PM  Result Value Ref Range   Influenza A, POC Negative Negative   Influenza B, POC Negative Negative     Assessment & Plan:   Assessment & Plan Upper respiratory infection, acute Acute upper respiratory infection, rule out pneumonia Persistent symptoms suggest acute upper respiratory infection with possible pneumonia. Previous Z-pack and prednisone ineffective with subsequent Levaquin  having to be ordered. Prednisone avoided due to hyperactivity and insomnia. Risk of QT prolongation with concurrent Seroquel  and Z-Pack. - Prescribed antibiotics, avoid concurrent use with Seroquel . - Advised antibiotics in the morning, Seroquel  at night.    Orders:   DG Chest 2 View; Future   azithromycin  (ZITHROMAX ) 250 MG tablet; 2 DAILY  FOR FIRST DAY, THEN DECREASE TO ONE DAILY FOR 4 MORE DAYS.  Flu-like symptoms Flu A & B - negative Covid - negative Orders:   POC COVID-19   POC Influenza A&B (Binax test)  History of pneumonia, recurrent History of Pneumonia as recently as October.  - Ordered chest x-ray to rule out pneumonia. Orders:   DG Chest 2 View; Future   azithromycin  (ZITHROMAX ) 250 MG tablet; 2 DAILY FOR FIRST DAY, THEN DECREASE TO ONE DAILY FOR 4 MORE DAYS.    Follow-up: Return if symptoms worsen or fail to improve.  An After Visit Summary was printed and given to the patient.  Harrie Cedar, FNP [facility] [phone]      [1]  Allergies Allergen Reactions   Augmentin [Amoxicillin-Pot Clavulanate]    "

## 2024-09-01 NOTE — Assessment & Plan Note (Addendum)
 Flu A & B - negative Covid - negative Orders:   POC COVID-19   POC Influenza A&B (Binax test)

## 2024-09-01 NOTE — Assessment & Plan Note (Addendum)
 Acute upper respiratory infection, rule out pneumonia Persistent symptoms suggest acute upper respiratory infection with possible pneumonia. Previous Z-pack and prednisone ineffective with subsequent Levaquin  having to be ordered. Prednisone avoided due to hyperactivity and insomnia. Risk of QT prolongation with concurrent Seroquel  and Z-Pack. - Prescribed antibiotics, avoid concurrent use with Seroquel . - Advised antibiotics in the morning, Seroquel  at night.    Orders:   DG Chest 2 View; Future   azithromycin  (ZITHROMAX ) 250 MG tablet; 2 DAILY FOR FIRST DAY, THEN DECREASE TO ONE DAILY FOR 4 MORE DAYS.

## 2024-09-01 NOTE — Assessment & Plan Note (Addendum)
 History of Pneumonia as recently as October.  - Ordered chest x-ray to rule out pneumonia. Orders:   DG Chest 2 View; Future   azithromycin  (ZITHROMAX ) 250 MG tablet; 2 DAILY FOR FIRST DAY, THEN DECREASE TO ONE DAILY FOR 4 MORE DAYS.

## 2024-09-29 ENCOUNTER — Ambulatory Visit

## 2024-11-14 ENCOUNTER — Ambulatory Visit: Admitting: Family Medicine
